# Patient Record
Sex: Male | Born: 1940 | Race: Black or African American | Hispanic: No | Marital: Married | Smoking: Former smoker
Health system: Southern US, Community
[De-identification: ages and names within clinical notes are randomized; demographics above are authoritative.]

## PROBLEM LIST (undated history)

## (undated) DIAGNOSIS — R7302 Impaired glucose tolerance (oral): Secondary | ICD-10-CM

## (undated) DIAGNOSIS — I10 Essential (primary) hypertension: Secondary | ICD-10-CM

## (undated) DIAGNOSIS — N529 Male erectile dysfunction, unspecified: Secondary | ICD-10-CM

## (undated) DIAGNOSIS — D369 Benign neoplasm, unspecified site: Secondary | ICD-10-CM

## (undated) DIAGNOSIS — H269 Unspecified cataract: Secondary | ICD-10-CM

## (undated) DIAGNOSIS — C801 Malignant (primary) neoplasm, unspecified: Secondary | ICD-10-CM

## (undated) DIAGNOSIS — T7840XA Allergy, unspecified, initial encounter: Secondary | ICD-10-CM

## (undated) DIAGNOSIS — D649 Anemia, unspecified: Secondary | ICD-10-CM

## (undated) HISTORY — DX: Male erectile dysfunction, unspecified: N52.9

## (undated) HISTORY — DX: Impaired glucose tolerance (oral): R73.02

## (undated) HISTORY — DX: Anemia, unspecified: D64.9

## (undated) HISTORY — DX: Allergy, unspecified, initial encounter: T78.40XA

## (undated) HISTORY — PX: COLONOSCOPY: SHX174

## (undated) HISTORY — DX: Benign neoplasm, unspecified site: D36.9

## (undated) HISTORY — DX: Essential (primary) hypertension: I10

## (undated) HISTORY — DX: Unspecified cataract: H26.9

---

## 2003-08-31 ENCOUNTER — Encounter: Payer: Self-pay | Admitting: Internal Medicine

## 2004-01-22 ENCOUNTER — Encounter: Payer: Self-pay | Admitting: Internal Medicine

## 2004-09-19 ENCOUNTER — Ambulatory Visit: Payer: Self-pay | Admitting: Internal Medicine

## 2005-01-16 ENCOUNTER — Ambulatory Visit: Payer: Self-pay | Admitting: Internal Medicine

## 2005-04-02 ENCOUNTER — Ambulatory Visit: Payer: Self-pay | Admitting: Internal Medicine

## 2005-05-30 ENCOUNTER — Ambulatory Visit: Payer: Self-pay | Admitting: Internal Medicine

## 2006-02-13 ENCOUNTER — Ambulatory Visit: Payer: Self-pay | Admitting: Internal Medicine

## 2006-04-07 ENCOUNTER — Ambulatory Visit: Payer: Self-pay | Admitting: Internal Medicine

## 2007-03-24 ENCOUNTER — Encounter: Payer: Self-pay | Admitting: Internal Medicine

## 2007-03-24 DIAGNOSIS — F528 Other sexual dysfunction not due to a substance or known physiological condition: Secondary | ICD-10-CM

## 2007-03-24 DIAGNOSIS — J301 Allergic rhinitis due to pollen: Secondary | ICD-10-CM

## 2007-08-18 ENCOUNTER — Ambulatory Visit: Payer: Self-pay | Admitting: Internal Medicine

## 2007-08-18 DIAGNOSIS — R7301 Impaired fasting glucose: Secondary | ICD-10-CM | POA: Insufficient documentation

## 2007-08-18 LAB — CONVERTED CEMR LAB
CO2: 32 meq/L (ref 19–32)
Calcium: 9.5 mg/dL (ref 8.4–10.5)
Cholesterol: 173 mg/dL (ref 0–200)
GFR calc Af Amer: 97 mL/min
Glucose, Bld: 106 mg/dL — ABNORMAL HIGH (ref 70–99)
HDL: 46.4 mg/dL (ref >39.0)
LDL Cholesterol: 106 mg/dL — ABNORMAL HIGH (ref 0–99)
Phosphorus: 3.5 mg/dL (ref 2.3–4.6)
Potassium: 4 meq/L (ref 3.5–5.1)
Sodium: 139 meq/L (ref 135–145)
Total CHOL/HDL Ratio: 3.7
Triglycerides: 104 mg/dL (ref 0–149)
VLDL: 21 mg/dL (ref 0–40)

## 2007-08-19 LAB — CONVERTED CEMR LAB
BUN: 8 mg/dL (ref 6–23)
CO2: 32 meq/L (ref 19–32)
Calcium: 9.5 mg/dL (ref 8.4–10.5)
Cholesterol: 173 mg/dL (ref 0–200)
GFR calc Af Amer: 97 mL/min
Hgb A1c MFr Bld: 6.1 % — ABNORMAL HIGH (ref 4.6–6.0)
LDL Cholesterol: 106 mg/dL — ABNORMAL HIGH (ref 0–99)
Phosphorus: 3.5 mg/dL (ref 2.3–4.6)
Potassium: 4 meq/L (ref 3.5–5.1)
Total CHOL/HDL Ratio: 3.7
VLDL: 21 mg/dL (ref 0–40)

## 2007-11-11 ENCOUNTER — Ambulatory Visit: Payer: Self-pay | Admitting: Internal Medicine

## 2008-06-27 ENCOUNTER — Ambulatory Visit: Payer: Self-pay | Admitting: Family Medicine

## 2008-06-27 DIAGNOSIS — I10 Essential (primary) hypertension: Secondary | ICD-10-CM

## 2008-08-01 ENCOUNTER — Ambulatory Visit: Payer: Self-pay | Admitting: Internal Medicine

## 2008-08-01 LAB — CONVERTED CEMR LAB
Albumin: 3.3 g/dL — ABNORMAL LOW (ref 3.5–5.2)
BUN: 13 mg/dL (ref 6–23)
CO2: 34 meq/L — ABNORMAL HIGH (ref 19–32)
Chloride: 99 meq/L (ref 96–112)
Creatinine, Ser: 1.1 mg/dL (ref 0.4–1.5)

## 2008-08-25 ENCOUNTER — Ambulatory Visit: Payer: Self-pay | Admitting: Internal Medicine

## 2008-09-08 ENCOUNTER — Ambulatory Visit: Payer: Self-pay | Admitting: Internal Medicine

## 2008-09-08 ENCOUNTER — Encounter: Payer: Self-pay | Admitting: Internal Medicine

## 2008-09-08 DIAGNOSIS — D126 Benign neoplasm of colon, unspecified: Secondary | ICD-10-CM

## 2008-09-11 ENCOUNTER — Ambulatory Visit: Payer: Self-pay | Admitting: Internal Medicine

## 2008-09-14 ENCOUNTER — Encounter (INDEPENDENT_AMBULATORY_CARE_PROVIDER_SITE_OTHER): Payer: Self-pay | Admitting: *Deleted

## 2008-10-20 ENCOUNTER — Ambulatory Visit: Payer: Self-pay | Admitting: Internal Medicine

## 2008-11-08 ENCOUNTER — Telehealth: Payer: Self-pay | Admitting: Internal Medicine

## 2008-11-17 ENCOUNTER — Ambulatory Visit (HOSPITAL_COMMUNITY): Admission: RE | Admit: 2008-11-17 | Discharge: 2008-11-17 | Payer: Self-pay | Admitting: Internal Medicine

## 2008-11-17 ENCOUNTER — Encounter: Payer: Self-pay | Admitting: Internal Medicine

## 2008-11-17 ENCOUNTER — Ambulatory Visit: Payer: Self-pay | Admitting: Internal Medicine

## 2009-01-24 ENCOUNTER — Ambulatory Visit: Payer: Self-pay | Admitting: Internal Medicine

## 2009-01-26 LAB — CONVERTED CEMR LAB
Albumin: 3.5 g/dL (ref 3.5–5.2)
Alkaline Phosphatase: 85 units/L (ref 39–117)
Basophils Relative: 0.6 % (ref 0.0–3.0)
CO2: 32 meq/L (ref 19–32)
Calcium: 9.4 mg/dL (ref 8.4–10.5)
Eosinophils Absolute: 0.1 10*3/uL (ref 0.0–0.7)
HCT: 39.5 % (ref 39.0–52.0)
Hemoglobin: 13.4 g/dL (ref 13.0–17.0)
Lymphs Abs: 2 10*3/uL (ref 0.7–4.0)
MCHC: 33.8 g/dL (ref 30.0–36.0)
MCV: 85.6 fL (ref 78.0–100.0)
Monocytes Absolute: 0.3 10*3/uL (ref 0.1–1.0)
Neutro Abs: 1.8 10*3/uL (ref 1.4–7.7)
Potassium: 4.3 meq/L (ref 3.5–5.1)
RBC: 4.61 M/uL (ref 4.22–5.81)
Sodium: 138 meq/L (ref 135–145)
TSH: 0.27 microintl units/mL — ABNORMAL LOW (ref 0.35–5.50)
Total Protein: 7.3 g/dL (ref 6.0–8.3)

## 2009-02-02 ENCOUNTER — Encounter (INDEPENDENT_AMBULATORY_CARE_PROVIDER_SITE_OTHER): Payer: Self-pay | Admitting: *Deleted

## 2009-02-07 ENCOUNTER — Telehealth: Payer: Self-pay | Admitting: Internal Medicine

## 2009-02-15 ENCOUNTER — Ambulatory Visit: Payer: Self-pay | Admitting: Internal Medicine

## 2009-02-16 LAB — CONVERTED CEMR LAB
Free T4: 1.1 ng/dL (ref 0.6–1.6)
TSH: 0.27 microintl units/mL — ABNORMAL LOW (ref 0.35–5.50)

## 2009-03-19 ENCOUNTER — Ambulatory Visit: Payer: Self-pay | Admitting: Internal Medicine

## 2009-03-20 ENCOUNTER — Telehealth: Payer: Self-pay | Admitting: Internal Medicine

## 2009-03-26 ENCOUNTER — Ambulatory Visit: Payer: Self-pay | Admitting: Internal Medicine

## 2009-03-26 ENCOUNTER — Ambulatory Visit (HOSPITAL_COMMUNITY): Admission: RE | Admit: 2009-03-26 | Discharge: 2009-03-26 | Payer: Self-pay | Admitting: Internal Medicine

## 2009-03-26 ENCOUNTER — Encounter: Payer: Self-pay | Admitting: Internal Medicine

## 2009-04-02 ENCOUNTER — Encounter (INDEPENDENT_AMBULATORY_CARE_PROVIDER_SITE_OTHER): Payer: Self-pay

## 2009-04-02 ENCOUNTER — Encounter: Payer: Self-pay | Admitting: Internal Medicine

## 2009-05-24 ENCOUNTER — Ambulatory Visit: Payer: Self-pay | Admitting: Internal Medicine

## 2009-08-20 ENCOUNTER — Encounter: Payer: Self-pay | Admitting: Internal Medicine

## 2009-08-21 ENCOUNTER — Ambulatory Visit: Payer: Self-pay | Admitting: Internal Medicine

## 2009-08-23 LAB — CONVERTED CEMR LAB
ALT: 19 units/L (ref 0–53)
Bilirubin, Direct: 0.1 mg/dL (ref 0.0–0.3)
Calcium: 9.9 mg/dL (ref 8.4–10.5)
Creatinine, Ser: 1.2 mg/dL (ref 0.4–1.5)
Eosinophils Absolute: 0.1 10*3/uL (ref 0.0–0.7)
Eosinophils Relative: 1.1 % (ref 0.0–5.0)
Glucose, Bld: 100 mg/dL — ABNORMAL HIGH (ref 70–99)
MCV: 89 fL (ref 78.0–100.0)
Monocytes Absolute: 0.4 10*3/uL (ref 0.1–1.0)
Neutrophils Relative %: 49.3 % (ref 43.0–77.0)
Platelets: 251 10*3/uL (ref 150.0–400.0)
Sodium: 140 meq/L (ref 135–145)
Total Bilirubin: 0.6 mg/dL (ref 0.3–1.2)
WBC: 4.9 10*3/uL (ref 4.5–10.5)

## 2009-10-30 ENCOUNTER — Ambulatory Visit: Payer: Self-pay | Admitting: Internal Medicine

## 2009-12-28 ENCOUNTER — Ambulatory Visit: Payer: Self-pay | Admitting: Internal Medicine

## 2010-03-04 ENCOUNTER — Encounter (INDEPENDENT_AMBULATORY_CARE_PROVIDER_SITE_OTHER): Payer: Self-pay | Admitting: *Deleted

## 2010-03-07 ENCOUNTER — Encounter (INDEPENDENT_AMBULATORY_CARE_PROVIDER_SITE_OTHER): Payer: Self-pay | Admitting: *Deleted

## 2010-04-10 ENCOUNTER — Encounter (INDEPENDENT_AMBULATORY_CARE_PROVIDER_SITE_OTHER): Payer: Self-pay | Admitting: *Deleted

## 2010-04-11 ENCOUNTER — Encounter (INDEPENDENT_AMBULATORY_CARE_PROVIDER_SITE_OTHER): Payer: Self-pay | Admitting: *Deleted

## 2010-04-12 ENCOUNTER — Ambulatory Visit: Payer: Self-pay | Admitting: Internal Medicine

## 2010-04-15 ENCOUNTER — Ambulatory Visit: Payer: Self-pay | Admitting: Internal Medicine

## 2010-04-24 ENCOUNTER — Ambulatory Visit: Payer: Self-pay | Admitting: Internal Medicine

## 2010-04-30 ENCOUNTER — Encounter: Payer: Self-pay | Admitting: Internal Medicine

## 2010-05-24 ENCOUNTER — Ambulatory Visit: Payer: Self-pay | Admitting: Internal Medicine

## 2010-05-27 LAB — CONVERTED CEMR LAB
ALT: 17 units/L (ref 0–53)
AST: 19 units/L (ref 0–37)
Basophils Relative: 0.7 % (ref 0.0–3.0)
Bilirubin, Direct: 0 mg/dL (ref 0.0–0.3)
CO2: 32 meq/L (ref 19–32)
Calcium: 9.3 mg/dL (ref 8.4–10.5)
Chloride: 104 meq/L (ref 96–112)
Eosinophils Relative: 1.3 % (ref 0.0–5.0)
HCT: 37 % — ABNORMAL LOW (ref 39.0–52.0)
Hgb A1c MFr Bld: 6.2 % (ref 4.6–6.5)
Lymphs Abs: 1.8 10*3/uL (ref 0.7–4.0)
MCV: 85 fL (ref 78.0–100.0)
Monocytes Absolute: 0.3 10*3/uL (ref 0.1–1.0)
Potassium: 4.2 meq/L (ref 3.5–5.1)
RBC: 4.36 M/uL (ref 4.22–5.81)
Sodium: 140 meq/L (ref 135–145)
Total Protein: 6.9 g/dL (ref 6.0–8.3)
WBC: 5.7 10*3/uL (ref 4.5–10.5)

## 2010-09-03 NOTE — Assessment & Plan Note (Signed)
Summary: BLEEDING WITH BOWEL MOVEMENTS   Vital Signs:  Patient profile:   70 year old male Height:      72 inches Weight:      226 pounds BMI:     30.76 Temp:     98.1 degrees F oral Pulse rate:   84 / minute Pulse rhythm:   regular BP sitting:   110 / 60  (left arm) Cuff size:   large  Vitals Entered By: Delilah Shan CMA Duncan Dull) (Dec 28, 2009 11:04 AM) CC: Bleeding with BM's   History of Present Illness: Noted some constipation about 3 weeks ago seemed to act up his hemorrhoids noticing blood with stools did try sitz baths Stool looked normal and blood just on paper and in bowl  trying to eat right and stay active  bowels are better now not taking any meds for this   Allergies: No Known Drug Allergies  Past History:  Past medical, surgical, family and social histories (including risk factors) reviewed for relevance to current acute and chronic problems.  Past Medical History: Reviewed history from 10/19/2008 and no changes required. Allergic rhinitis Glucose intolerance Erectile dysfunction Hypertension Adenomatous Colon Polyps Hemorrhoids  Past Surgical History: Reviewed history from 03/24/2007 and no changes required. Cardiolite basically negative 08/05  Family History: Reviewed history from 03/24/2007 and no changes required. Mom with ESRD and HTN Dad with CVA, HTN Pat GF died of leukemia Mat GF died of ?cancer, had DM No CAD  Social History: Reviewed history from 08/18/2007 and no changes required. 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   Rectal:  no hemorrhoids and no masses.   Prostate:  no gland enlargement and no nodules.     Impression & Recommendations:  Problem # 1:  RECTAL BLEEDING  (ICD-569.3) Assessment New probably small fissure from hard stools  better now will give Rx to use as needed  miralax if constipated  Complete Medication List: 1)  Amlodipine Besylate 5 Mg Tabs (Amlodipine besylate) .Marland Kitchen.. 1 tab daily for high blood pressure 2)  Anusol-hc 2.5 % Crea (Hydrocortisone) .... Apply three times a day to rectum as needed for pain or bleeding  Patient Instructions: 1)  Please use miralax 2-3 times daily if you get constipated again 2)  Please keep appt for physical Prescriptions: ANUSOL-HC 2.5 % CREA (HYDROCORTISONE) apply three times a day to rectum as needed for pain or bleeding  #1 tube x 2   Entered and Authorized by:   Cindee Salt MD   Signed by:   Cindee Salt MD on 12/28/2009   Method used:   Electronically to        [facility] [address] [phone]* (retail)       [address] [place], [place]  [address]       Ph: [phone]       Fax: [phone]   RxID:   [id number]    Orders Added: 1)  Est. Patient Level III [[address]]   Current Allergies (reviewed today): No known allergies

## 2010-09-03 NOTE — Miscellaneous (Signed)
Summary: [facility] Previsit/prep  Clinical Lists Changes  Medications: Added new medication of MOVIPREP 100 GM  SOLR (PEG-KCL-NACL-NASULF-NA ASC-C) As per prep instructions. - Signed Rx of MOVIPREP 100 GM  SOLR (PEG-KCL-NACL-NASULF-NA ASC-C) As per prep instructions.;  #1 x 0;  Signed;  Entered by: Wyona Almas RN;  Authorized by: Iva Boop MD, [facility];  Method used: Electronically to [facility] [address] [place] #[id number]*, [address], [place], [place]  [address], Ph: [phone], Fax: [phone] Observations: Added new observation of NKA: T (04/15/2010 8:59)    Prescriptions: MOVIPREP 100 GM  SOLR (PEG-KCL-NACL-NASULF-NA ASC-C) As per prep instructions.  #1 x 0   Entered by:   Wyona Almas RN   Authorized by:   Iva Boop MD, [facility]   Signed by:   Wyona Almas RN on 04/15/2010   Method used:   Electronically to        [facility] [address] [phone]* (retail)       [address], [place]  [address]       Ph: [phone]       Fax: [phone]   RxID:   [id number]

## 2010-09-03 NOTE — Letter (Signed)
Summary: Previsit letter  [facility]  [address] [place], [place] [address]   Phone: [phone]  Fax: [phone]       03/07/2010 MRN: [id number]  [facility] [address], [place]  [address]  Dear Mr. Holsonback,  Welcome to the Gastroenterology Division at [facility].    You are scheduled to see a nurse for your pre-procedure visit on 04-10-10 at 8:00A.M. on the 3rd floor at [facility], [address]  We ask that you try to arrive at our office 15 minutes prior to your appointment time to allow for check-in.  Your nurse visit will consist of discussing your medical and surgical history, your immediate family medical history, and your medications.    Please bring a complete list of all your medications or, if you prefer, bring the medication bottles and we will list them.  We will need to be aware of both prescribed and over the counter drugs.  We will need to know exact dosage information as well.  If you are on blood thinners (Coumadin, Plavix, Aggrenox, Ticlid, etc.) please call our office today/prior to your appointment, as we need to consult with your physician about holding your medication.   Please be prepared to read and sign documents such as consent forms, a financial agreement, and acknowledgement forms.  If necessary, and with your consent, a friend or relative is welcome to sit-in on the nurse visit with you.  Please bring your insurance card so that we may make a copy of it.  If your insurance requires a referral to see a specialist, please bring your referral form from your primary care physician.  No co-pay is required for this nurse visit.     If you cannot keep your appointment, please call [phone] to cancel or reschedule prior to your appointment date.  This allows [place] the opportunity to schedule an appointment for another patient in need of care.    Thank you for choosing [facility] for your medical  needs.  We appreciate the opportunity to care for you.  Please visit [place] at our website  to learn more about our practice.                     Sincerely.                                                                                                                   The Gastroenterology Division

## 2010-09-03 NOTE — Assessment & Plan Note (Signed)
Summary: CPX/RBH  R/S FROM 05/03/10   Vital Signs:  Patient profile:   70 year old male Height:      72 inches Weight:      228 pounds Temp:     98.1 degrees F oral Pulse rate:   76 / minute Pulse rhythm:   regular BP sitting:   120 / 70  (left arm) Cuff size:   large  Vitals Entered By: Mervin Hack CMA Duncan Dull) (May 24, 2010 8:06 AM) CC: adult physical   History of Present Illness: DOing better with bowels Not constipated with the miralax  No trouble with the BP med  He has no new concerns Occ soreness in back No limitations though--tries to stay active. 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  Past Medical History: Reviewed history from 10/19/2008 and no changes required. Allergic rhinitis Glucose intolerance Erectile dysfunction Hypertension Adenomatous Colon Polyps Hemorrhoids  Past Surgical History: Reviewed history from 03/24/2007 and no changes required. Cardiolite basically negative 08/05  Family History: Reviewed history from 03/24/2007 and no changes required. 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 Eyes:  Denies double vision and vision loss-1 eye. ENT:  Complains of ringing in ears; denies decreased hearing; full dentures. CV:  Denies chest pain or discomfort, difficulty breathing at night, difficulty breathing while lying down, fainting, lightheadness, palpitations, and shortness of  breath with exertion. Resp:  Denies cough and shortness of breath. GI:  Denies abdominal pain, bloody stools, change in bowel habits, dark tarry stools, indigestion, nausea, and vomiting. GU:  Complains of nocturia and urinary frequency; no sex--no problem. MS:  Complains of low back pain; denies joint pain and joint swelling. Derm:  Denies lesion(s) and rash. Neuro:  Complains of weakness; denies numbness and tingling; occ trouble picking things up with right hand. Psych:  Denies anxiety and depression. Endo:  Denies cold intolerance, excessive urination, and heat intolerance. Heme:  Denies abnormal bruising and enlarge lymph nodes. Allergy:  Complains of seasonal allergies and sneezing; mild allergies--claritin helps.  Physical Exam  General:  alert and normal appearance.   Eyes:  pupils equal, pupils round, and pupils reactive to light.  Disks sharp---some dark spots on left disk (due for his eye exam) Ears:  R ear normal and L ear normal.   Mouth:  no erythema, no exudates, and no lesions.   Neck:  supple, no masses, no thyromegaly, no carotid bruits, and no cervical lymphadenopathy.   Lungs:  normal respiratory effort, no intercostal retractions, no accessory muscle use, and normal breath sounds.   Heart:  normal rate, regular rhythm, no murmur, and no gallop.   Abdomen:  soft, non-tender, and no masses.   Msk:  no joint tenderness and no joint swelling.   fairly normal active ROM in shoulders Pulses:  normal in feet Extremities:  no edema Neurologic:  alert & oriented X3, strength normal in all extremities, and gait normal.   Skin:  no rashes and no suspicious lesions.   Axillary Nodes:  No palpable lymphadenopathy Psych:  normally interactive, good eye contact, not anxious appearing, and not depressed appearing.     Impression & Recommendations:  Problem # 1:  PREVENTIVE HEALTH CARE (ICD-V70.0) Assessment Comment Only healthy discussed fitness just had colon PSA less  than 1 year ago--will reconsider in 1 year Imms UTD  Problem # 2:  HYPERTENSION (ICD-401.9) Assessment: Comment Only  good control now no changes  His updated medication list for this problem includes:    Amlodipine Besylate 5 Mg Tabs (Amlodipine besylate) .Marland Kitchen... 1 tab daily for high blood pressure  BP today: 120/70 Prior BP: 140/80 (04/12/2010)  Labs Reviewed: K+: 4.4 (08/21/2009) Creat: : 1.2 (08/21/2009)   Chol: 173 (08/18/2007)   HDL: 46.4 (08/18/2007)   LDL: 106 (08/18/2007)   TG: 104 (08/18/2007)  Orders: TLB-Renal Function Panel (80069-RENAL) TLB-CBC Platelet - w/Differential (85025-CBCD) TLB-Hepatic/Liver Function Pnl (80076-HEPATIC) TLB-TSH (Thyroid Stimulating Hormone) (84443-TSH) Venipuncture (11914)  Problem # 3:  IMPAIRED FASTING GLUCOSE (ICD-790.21) Assessment: Comment Only  discussed weight control and eating will check A1c  Orders: TLB-A1C / Hgb A1C (Glycohemoglobin) (83036-A1C)  Complete Medication List: 1)  Amlodipine Besylate 5 Mg Tabs (Amlodipine besylate) .Marland Kitchen.. 1 tab daily for high blood pressure  Patient Instructions: 1)  Please schedule a follow-up appointment in 6 months .    Orders Added: 1)  Est. Patient 65& > [99397] 2)  TLB-Renal Function Panel [80069-RENAL] 3)  TLB-CBC Platelet - w/Differential [85025-CBCD] 4)  TLB-Hepatic/Liver Function Pnl [80076-HEPATIC] 5)  TLB-TSH (Thyroid Stimulating Hormone) [84443-TSH] 6)  Venipuncture [36415] 7)  TLB-A1C / Hgb A1C (Glycohemoglobin) [83036-A1C]    Current Allergies (reviewed today): No known allergies

## 2010-09-03 NOTE — Assessment & Plan Note (Signed)
Summary: HEMORRHOIDS/CLE   Vital Signs:  Patient profile:   70 year old male Weight:      227 pounds Temp:     98.4 degrees F oral Pulse rate:   76 / minute Pulse rhythm:   regular BP sitting:   140 / 80  (left arm) Cuff size:   large  Vitals Entered By: Mervin Hack CMA Duncan Dull) (April 12, 2010 11:08 AM) CC: hemorrhoids   History of Present Illness: having trouble moving bowels tried the cream and the bleeding has stopped He notes straining to get things going--then he does empty well some pain for a couple of hours after Goes about 3 times a week Not particularly big---does clog the bowl  appetite is fine no abd pain Hasn't used any laxative but his daughter got him something to take  Allergies: No Known Drug Allergies  Past History:  Past medical, surgical, family and social histories (including risk factors) reviewed for relevance to current acute and chronic problems.  Past Medical History: Reviewed history from 10/19/2008 and no changes required. Allergic rhinitis Glucose intolerance Erectile dysfunction Hypertension Adenomatous Colon Polyps Hemorrhoids  Past Surgical History: Reviewed history from 03/24/2007 and no changes required. Cardiolite basically negative 08/05  Family History: Reviewed history from 03/24/2007 and no changes required. Mom with ESRD and HTN Dad with CVA, HTN Pat GF died of leukemia Mat GF died of ?cancer, had DM No CAD  Social History: Reviewed history from 08/18/2007 and no changes required. 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   Rectal:  no external abnormalities and no hemorrhoids.   Very tender with digital exam   Impression & Recommendations:  Problem # 1:  RECTAL PAIN  (WJX-914.78) Assessment New seems to have rectal irritation---probably from hard stool he will use the anusol inside start miralax to soften stools and get him more regular  Complete Medication List: 1)  Amlodipine Besylate 5 Mg Tabs (Amlodipine besylate) .Marland Kitchen.. 1 tab daily for high blood pressure 2)  Anusol-hc 2.5 % Crea (Hydrocortisone) .... Apply three times a day to rectum as needed for pain or bleeding 3)  Miralax Powd (Polyethylene glycol 3350) .Marland Kitchen.. 1 capful mixed with water daily to prevent constipation  Patient Instructions: 1)  Please keep appt for physical in October  Current Allergies (reviewed today): No known allergies   Appended Document: HEMORRHOIDS/CLE     Clinical Lists Changes  Orders: Added new Service order of Flu Vaccine 95yrs + [phone]) - Signed Added new Service order of Admin 1st Vaccine (13086) - Signed Added new Service order of Admin 1st Vaccine [facility]) [phone]) - Signed Observations: Added new observation of FLU VAX#1VIS: 02/26/10 version given April 12, 2010. (04/12/2010 11:50) Added new observation of FLU VAXLOT: [id number] (04/12/2010 11:50) Added new observation of FLU VAX EXP: 02/01/2011 (04/12/2010 11:50) Added new observation of FLU VAXBY: DeShannon Smith CMA (AAMA) (04/12/2010 11:50) Added new observation of FLU VAXRTE: IM (04/12/2010 11:50) Added new observation of FLU VAX DSE: 0.5 ml (04/12/2010 11:50) Added new observation of FLU VAXMFR: GlaxoSmithKline (04/12/2010 11:50) Added new observation of FLU VAX SITE: left deltoid (04/12/2010 11:50) Added new observation of FLU VAX: Fluvax 3+ (04/12/2010 11:50)       Influenza Vaccine    Vaccine Type:  Fluvax 3+    Site: left deltoid    Mfr: GlaxoSmithKline    Dose: 0.5 ml    Route: IM    Given by: Mervin Hack CMA (AAMA)    Exp. Date: 02/01/2011    Lot #: [id number]    VIS given: 02/26/10 version given April 12, 2010.  Flu Vaccine Consent Questions    Do you have a history  of severe allergic reactions to this vaccine? no    Any prior history of allergic reactions to egg and/or gelatin? no    Do you have a sensitivity to the preservative Thimersol? no    Do you have a past history of Guillan-Barre Syndrome? no    Do you currently have an acute febrile illness? no    Have you ever had a severe reaction to latex? no    Vaccine information given and explained to patient? yes

## 2010-09-03 NOTE — Letter (Signed)
Summary: Colonoscopy Letter   Gastroenterology  [address] [place], [place] [address]   Phone: [phone]  Fax: [phone]      March 04, 2010 MRN: [id number]   [facility] [address] [place], [place]  [address]   Dear [name],   According to your medical record, it is time for you to schedule a Colonoscopy. The American Cancer Society recommends this procedure as a method to detect early colon cancer. Patients with a family history of colon cancer, or a personal history of colon polyps or inflammatory bowel disease are at increased risk.  This letter has beeen generated based on the recommendations made at the time of your procedure. If you feel that in your particular situation this may no longer apply, please contact our office.  Please call our office at [phone] to schedule this appointment or to update your records at your earliest convenience.  Thank you for cooperating with [place] to provide you with the very best care possible.   Sincerely,   Iva Boop, M.D.  [facility] Gastroenterology Division [phone]

## 2010-09-03 NOTE — Procedures (Signed)
Summary: Colonoscopy  Patient: Matthew Carter Note: All result statuses are Final unless otherwise noted.  Tests: (1) Colonoscopy (COL)   COL Colonoscopy           DONE     [facility]     [address]     [place], [place]  [address]           COLONOSCOPY PROCEDURE REPORT           PATIENT:  Matthew Carter, Matthew Carter  MR#:  [id number]     BIRTHDATE:  1940-09-18, 69 yrs. old  GENDER:  male     ENDOSCOPIST:  Iva Boop, MD, [facility]     REF. BY:     PROCEDURE DATE:  04/24/2010     PROCEDURE:  Colonoscopy with snare polypectomy     ASA CLASS:  Class II     INDICATIONS:  surveillance and high-risk screening, history of     pre-cancerous (adenomatous) colon polyps 7 polyps removed 2010,     tubulovillous, adenoma and hyperplastic     largest was 3 cm sessile TV adenoma of sigmoid     for closer follow-up and screening due to size, # and type of     polyps     MEDICATIONS:   Fentanyl 50 mcg IV, Versed 6 mg IV           DESCRIPTION OF PROCEDURE:   After the risks benefits and     alternatives of the procedure were thoroughly explained, informed     consent was obtained.  Digital rectal exam was performed and     revealed no abnormalities and normal prostate.   The LB [id number]-[id number]     [id number] endoscope was introduced through the anus and advanced to     the cecum, which was identified by both the appendix and ileocecal     valve, without limitations.  The quality of the prep was     excellent, using MoviPrep.  The instrument was then slowly     withdrawn as the colon was fully examined.     Insertion: 4:19 minutes Withdrawal: 15:45 minutes     <<PROCEDUREIMAGES>>           FINDINGS:  A sessile polyp was found at the hepatic flexure. It     was 6 mm in size. Seen on right colon retroflexion. Polyp was     snared without cautery. Retrieval was unsuccessful.  A     pedunculated polyp was found. It was 1 cm in size. Polyp was     snared, then cauterized with monopolar cautery. Retrieval  was     successful. Mucosal abnormality in the sigmoid colon. Tattoo and     scar at prior sigmoid polypectmy site. No evidence of recurrent     polyp.  This was otherwise a normal examination of the colon.     Retroflexed views in the rectum revealed no abnormalities.    The     scope was then withdrawn from the patient and the procedure     completed.           COMPLICATIONS:  None     ENDOSCOPIC IMPRESSION:     1) 6 mm sessile polyp at the hepatic flexure - removed but not     recovered     2) 1 cm pedunculated polyp - removed and recovered     3) Mucosal abnormality in the sigmoid colon     4) Otherwise normal examination,  excellent prep     5) 7 adenomas removed in 2010, largest 3 cm TV adenoma.     RECOMMENDATIONS:     1) No aspirin or NSAID's for 1 week.     2) Continue MiraLax for constipation as recommended by Dr. Alphonsus Sias           REPEAT EXAM:  In for Colonoscopy, pending biopsy results.           Iva Boop, MD, [facility]           CC:  The Patient           n.     eSIGNED:   Iva Boop at 04/24/2010 11:58 AM           Matthew Carter, [id number]  Note: An exclamation mark (!) indicates a result that was not dispersed into the flowsheet. Document Creation Date: 04/24/2010 11:59 AM _______________________________________________________________________  (1) Order result status: Final Collection or observation date-time: 04/24/2010 11:46 Requested date-time:  Receipt date-time:  Reported date-time:  Referring Physician:   Ordering Physician: Stan Head [phone]) Specimen Source:  Source: Launa Grill Order Number: [id number] Lab site:   Appended Document: Colonoscopy     Procedures Next Due Date:    Colonoscopy: 05/2013

## 2010-09-03 NOTE — Letter (Signed)
Summary: Pre Visit No Show Letter  [facility]  [address] [place], [place] [address]   Phone: [phone]  Fax: [phone]        April 10, 2010 MRN: [id number]    [facility] [address] [place], [place]  [address]    Dear Mr. Arnett,   We have been unable to reach you by phone concerning the pre-procedure visit that you missed on 04/10/2010. For this reason,your procedure scheduled on Wednesday 04/24/2010 has been cancelled. Our scheduling staff will gladly assist you with rescheduling your appointments at a more convenient time. Please call our office at [phone] between the hours of 8:00am and 5:00pm, press option #2 to reach an appointment scheduler. Please consider updating your contact numbers at this time so that we can reach you by phone in the future with schedule changes or results.    Thank you,    Ezra Sites RN [facility]

## 2010-09-03 NOTE — Assessment & Plan Note (Signed)
Summary: 8:15 APPT 2 MONTH FOLLOW UP/[facility]   Vital Signs:  Patient profile:   70 year old male Weight:      228 pounds Temp:     98.3 degrees F oral Pulse rate:   64 / minute Pulse rhythm:   regular BP sitting:   132 / 78  (left arm) Cuff size:   large  Vitals Entered By: Mervin Hack CMA Duncan Dull) (October 30, 2009 8:15 AM) CC: 2 month follow-up   History of Present Illness: Doing well on new med  Checks BP at home Usually  ~132/70 or close No headaches No chest pain No SOB  No change in exercise tolerance Walking every other day---3/4 mile  Allergies: No Known Drug Allergies  Past History:  Past medical, surgical, family and social histories (including risk factors) reviewed for relevance to current acute and chronic problems.  Past Medical History: Reviewed history from 10/19/2008 and no changes required. Allergic rhinitis Glucose intolerance Erectile dysfunction Hypertension Adenomatous Colon Polyps Hemorrhoids  Past Surgical History: Reviewed history from 03/24/2007 and no changes required. Cardiolite basically negative 08/05  Family History: Reviewed history from 03/24/2007 and no changes required. Mom with ESRD and HTN Dad with CVA, HTN Pat GF died of leukemia Mat GF died of ?cancer, had DM No CAD  Social History: Reviewed history from 08/18/2007 and no changes required. 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   Neck:  supple, no masses, no thyromegaly, no carotid bruits, and no cervical lymphadenopathy.   Lungs:  normal respiratory effort and normal breath sounds.   Heart:  normal rate, regular rhythm, no murmur, and no gallop.   Extremities:   no edema Psych:  normally interactive, good eye contact, not anxious appearing, and not depressed appearing.     Impression & Recommendations:  Problem # 1:  HYPERTENSION (ICD-401.9) Assessment Improved doing well on new med no changes needed  His updated medication list for this problem includes:    Amlodipine Besylate 5 Mg Tabs (Amlodipine besylate) .Marland Kitchen... 1 tab daily for high blood pressure  BP today: 132/78 Prior BP: 152/84 (08/21/2009)  Labs Reviewed: K+: 4.4 (08/21/2009) Creat: : 1.2 (08/21/2009)   Chol: 173 (08/18/2007)   HDL: 46.4 (08/18/2007)   LDL: 106 (08/18/2007)   TG: 104 (08/18/2007)  Complete Medication List: 1)  Amlodipine Besylate 5 Mg Tabs (Amlodipine besylate) .Marland Kitchen.. 1 tab daily for high blood pressure  Patient Instructions: 1)  Please schedule a follow-up appointment in 6 months for physical  Current Allergies (reviewed today): No known allergies

## 2010-09-03 NOTE — Letter (Signed)
Summary: Patient Notice- Polyp Results  [facility]  [address] [place], [place] [address]   Phone: [phone]  Fax: [phone]        April 30, 2010 MRN: [id number]    [facility] [address] [place], [place]  [address]    Dear Mr. Obst,  The larger polyp removed from your colon was adenomatous. This means that it was pre-cancerous or that  it had the potential to change into cancer over time. The smaller polyp was removed but not recovered.  I recommend that you have a repeat colonoscopy in 3 years to determine if you have developed any new polyps over time. If you develop any new rectal bleeding, abdominal pain or significant bowel habit changes, please contact us before then.  Please call us if you are having persistent problems or have questions about your condition that have not been fully answered at this time.  Sincerely,  Iva Boop MD, [facility]  This letter has been electronically signed by your physician.  Appended Document: Patient Notice- Polyp Results letter mailed

## 2010-09-03 NOTE — Assessment & Plan Note (Signed)
Summary: 8:15 FOLLOW UP/RBH   Vital Signs:  Patient profile:   70 year old male Weight:      228.50 pounds BMI:     31.10 Temp:     98.1 degrees F oral Pulse rate:   68 / minute Pulse rhythm:   regular BP sitting:   152 / 84  (left arm) Cuff size:   regular  Vitals Entered By: Linde Gillis CMA Duncan Dull) (August 21, 2009 8:03 AM) CC: follow-up visit   History of Present Illness: Doing fairly well Feels that he gets funny feeling in chest after taking HCTZ--brief (5-10 minutes). May occur 45-60 minutes after taking No dizziness forgot it one day and no sensation  Stays active walks regularly No change in stamina No chest pain--except can get tight if he goes out in the cold No sig SOB    Allergies (verified): No Known Drug Allergies  Past History:  Past medical, surgical, family and social histories (including risk factors) reviewed for relevance to current acute and chronic problems.  Past Medical History: Reviewed history from 10/19/2008 and no changes required. Allergic rhinitis Glucose intolerance Erectile dysfunction Hypertension Adenomatous Colon Polyps Hemorrhoids  Past Surgical History: Reviewed history from 03/24/2007 and no changes required. Cardiolite basically negative 08/05  Family History: Reviewed history from 03/24/2007 and no changes required. Mom with ESRD and HTN Dad with CVA, HTN Pat GF died of leukemia Mat GF died of ?cancer, had DM No CAD  Social History: Reviewed history from 08/18/2007 and no changes required. 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   Neck:  supple, no masses, no thyromegaly, no carotid bruits, and no  cervical lymphadenopathy.   Lungs:  normal respiratory effort and normal breath sounds.   Heart:  normal rate, regular rhythm, no murmur, and no gallop.   Abdomen:  soft, non-tender, and no masses.   Pulses:  1+ in feet Extremities:  no edema Neurologic:  alert & oriented X3, strength normal in all extremities, and gait normal.   Skin:  no rashes and no suspicious lesions.   Psych:  normally interactive, good eye contact, not anxious appearing, and not depressed appearing.     Impression & Recommendations:  Problem # 1:  CHEST PAIN (ICD-786.50) Assessment New  actually a vague discomfort after taking HCTZ no symptoms to suggest ischemia will check labs and try changing BP med  Orders: EKG w/ Interpretation (93000) TLB-Renal Function Panel (80069-RENAL) TLB-CBC Platelet - w/Differential (85025-CBCD) TLB-Hepatic/Liver Function Pnl (80076-HEPATIC) TLB-TSH (Thyroid Stimulating Hormone) (84443-TSH) Venipuncture (96045)  Problem # 2:  HYPERTENSION (ICD-401.9) Assessment: Comment Only will change due to side effects with HCTZ  The following medications were removed from the medication list:    Hydrochlorothiazide 12.5 Mg Tabs (Hydrochlorothiazide) .Marland Kitchen... 1 by mouth daily His updated medication list for this problem includes:    Amlodipine Besylate 5 Mg Tabs (Amlodipine besylate) .Marland Kitchen... 1 tab daily for high blood pressure  BP today: 152/84 Prior BP: 128/70 (02/15/2009)  Labs Reviewed: K+: 4.3 (01/24/2009) Creat: : 1.1 (01/24/2009)   Chol: 173 (08/18/2007)   HDL: 46.4 (08/18/2007)   LDL: 106 (08/18/2007)   TG: 104 (08/18/2007)  Complete Medication List: 1)  Amlodipine Besylate 5  Mg Tabs (Amlodipine besylate) .Marland Kitchen.. 1 tab daily for high blood pressure  Other Orders: Pneumococcal Vaccine ([address]) Admin 1st Vaccine ([address]454) TLB-PSA (Prostate Specific Antigen) (84153-PSA)  Patient Instructions: 1)  Please schedule a follow-up appointment in 2 months.  Prescriptions: AMLODIPINE  BESYLATE 5 MG TABS (AMLODIPINE BESYLATE) 1 tab daily for high blood pressure  #30 x 12   Entered and Authorized by:   Cindee Salt MD   Signed by:   Cindee Salt MD on 08/21/2009   Method used:   Electronically to        [facility] [address] [phone]* (retail)       [address] [place], [place]  [address]       Ph: [phone]       Fax: [phone]   RxID:   [id number]   Current Allergies (reviewed today): No known allergies    Immunizations Administered:  Pneumonia Vaccine:    Vaccine Type: Pneumovax    Site: left deltoid    Mfr: Merck    Dose: 0.5 ml    Route: IM    Given by: Linde Gillis CMA (AAMA)    Exp. Date: 07/14/2010    Lot #: [id number]    VIS given: 03/01/96 version given August 21, 2009.    EKG  Procedure date:  08/21/2009  Findings:      sinus @74  LBBB

## 2010-09-03 NOTE — Letter (Signed)
Summary: [facility] Instructions  [facility]  [address] [place], [place] [address]   Phone: [phone]  Fax: [phone]       Matthew Carter    [birth year]    MRN: [id number]        Procedure Day Dorna Bloom:  Wednesday   04/24/10     Arrival Time:  9:30 am      Procedure Time:  10:30 am     Location of Procedure:                    _x _  [facility] (4th Floor)                        PREPARATION FOR COLONOSCOPY WITH MOVIPREP   Starting 5 days prior to your procedure Friday 04/19/10 do not eat nuts, seeds, popcorn, corn, beans, peas,  salads, or any raw vegetables.  Do not take any fiber supplements (e.g. Metamucil, Citrucel, and Benefiber).  THE DAY BEFORE YOUR PROCEDURE         DATE: Tuesday   04/23/10 1.  Drink clear liquids the entire day-NO SOLID FOOD  2.  Do not drink anything colored red or purple.  Avoid juices with pulp.  No orange juice.  3.  Drink at least 64 oz. (8 glasses) of fluid/clear liquids during the day to prevent dehydration and help the prep work efficiently.  CLEAR LIQUIDS INCLUDE: Water Jello Ice Popsicles Tea (sugar ok, no milk/cream) Powdered fruit flavored drinks Coffee (sugar ok, no milk/cream) Gatorade Juice: apple, white grape, white cranberry  Lemonade Clear bullion, consomm, broth Carbonated beverages (any kind) Strained chicken noodle soup Hard Candy                             4.  In the morning, mix first dose of MoviPrep solution:    Empty 1 Pouch A and 1 Pouch B into the disposable container    Add lukewarm drinking water to the top line of the container. Mix to dissolve    Refrigerate (mixed solution should be used within 24 hrs)  5.  Begin drinking the prep at 5:00 p.m. The MoviPrep container is divided by 4 marks.   Every 15 minutes drink the solution down to the next mark (approximately 8 oz) until the full liter is complete.   6.  Follow completed prep with 16 oz of clear liquid of your choice  (Nothing red or purple).  Continue to drink clear liquids until bedtime.  7.  Before going to bed, mix second dose of MoviPrep solution:    Empty 1 Pouch A and 1 Pouch B into the disposable container    Add lukewarm drinking water to the top line of the container. Mix to dissolve    Refrigerate  THE DAY OF YOUR PROCEDURE      DATE: Wednesday   04/24/10  Beginning at 5:30 a.m. (5 hours before procedure):         1. Every 15 minutes, drink the solution down to the next mark (approx 8 oz) until the full liter is complete.  2. Follow completed prep with 16 oz. of clear liquid of your choice.    3. You may drink clear liquids until 8:30 am (2 HOURS BEFORE PROCEDURE).   MEDICATION INSTRUCTIONS  Unless otherwise instructed, you should take regular prescription medications with a small sip of water  as early as possible the morning of your procedure.        OTHER INSTRUCTIONS  You will need a responsible adult at least 68 years of age to accompany you and drive you home.   This person must remain in the waiting room during your procedure.  Wear loose fitting clothing that is easily removed.  Leave jewelry and other valuables at home.  However, you may wish to bring a book to read or  an iPod/MP3 player to listen to music as you wait for your procedure to start.  Remove all body piercing jewelry and leave at home.  Total time from sign-in until discharge is approximately 2-3 hours.  You should go home directly after your procedure and rest.  You can resume normal activities the  day after your procedure.  The day of your procedure you should not:   Drive   Make legal decisions   Operate machinery   Drink alcohol   Return to work  You will receive specific instructions about eating, activities and medications before you leave.    The above instructions have been reviewed and explained to me by   Wyona Almas RN  April 15, 2010 9:34 AM     I fully  understand and can verbalize these instructions _____________________________ Date _________

## 2010-10-08 ENCOUNTER — Encounter: Payer: Self-pay | Admitting: Internal Medicine

## 2010-10-08 LAB — HM SIGMOIDOSCOPY

## 2010-10-08 LAB — FECAL OCCULT BLOOD, GUAIAC: Fecal Occult Blood: NEGATIVE

## 2010-11-27 ENCOUNTER — Encounter: Payer: Self-pay | Admitting: Internal Medicine

## 2010-11-27 ENCOUNTER — Ambulatory Visit (INDEPENDENT_AMBULATORY_CARE_PROVIDER_SITE_OTHER): Payer: Medicare Other | Admitting: Internal Medicine

## 2010-11-27 VITALS — BP 130/70 | HR 74 | Temp 98.0°F | Ht 72.0 in | Wt 228.0 lb

## 2010-11-27 DIAGNOSIS — R109 Unspecified abdominal pain: Secondary | ICD-10-CM | POA: Insufficient documentation

## 2010-11-27 DIAGNOSIS — K6289 Other specified diseases of anus and rectum: Secondary | ICD-10-CM

## 2010-11-27 DIAGNOSIS — I10 Essential (primary) hypertension: Secondary | ICD-10-CM

## 2010-11-27 DIAGNOSIS — J309 Allergic rhinitis, unspecified: Secondary | ICD-10-CM

## 2010-11-27 NOTE — Patient Instructions (Signed)
  Sitz Bath A sitz bath is a warm water bath taken in the sitting position that covers only the hips and buttocks. It may be used for either healing or hygiene purposes. The water may contain medication. Sitz baths are often used to relieve pain, itching or muscle spasms. Moist heat will help you heal and relax. Follow these steps carefully: HOME CARE INSTRUCTIONS  Fill the bathtub half full with warm water. Not Too Hot!   Sit in the water and open the drain a little.   Turn on the warm water to keep the tub half full. Keep the water running constantly.   Soak in the water for 15 to 20 minutes.   After the sitz bath, blot dry the affected part first.   Take 3 to 4 sitz baths a day.  SEEK MEDICAL CARE IF: You get worse instead of better; stop the sitz baths. Document Released: 04/12/2004 Document Re-Released: 08/12/2009 [facility] Patient Information 2011 [place], [place].

## 2010-11-27 NOTE — Progress Notes (Signed)
  Subjective:    Patient ID: SKIP LITKE, male    DOB: [birth year], 70 y.o.   MRN: [id number]  HPI DOing okay in general  Still has problem when going to the bathroom--gets pain after BM No blood Has been using the cream I prescribed occ Seemed to help at first Uses MOM at times--softens bowels  No sig allergy problems Usually pollen sensitive but not bad this year Not needing meds  Occ checks BP at home Generally about 130-140/70-80 No headaches No chest pain or SOB No edema  Current outpatient prescriptions:amLODipine (NORVASC) 5 MG tablet, Take 5 mg by mouth daily.  , Disp: , Rfl:   Past Medical History  Diagnosis Date  . Allergy   . Glucose intolerance (impaired glucose tolerance)   . ED (erectile dysfunction)   . Hypertension   . Adenomatous polyps   . Hemorrhoids     No past surgical history on file.  Family History  Problem Relation Age of Onset  . GER disease Mother   . Hypertension Mother   . Stroke Father   . Hypertension Father   . Cancer Paternal Grandfather     Leukemia  . Diabetes Paternal Grandfather     History   Social History  . Marital Status: Married    Spouse Name: N/A    Number of Children: 3  . Years of Education: N/A   Occupational History  . Retired Librarian, academic   Social History Main Topics  . Smoking status: Former Smoker    Quit date: 08/04/1981  . Smokeless tobacco: Not on file  . Alcohol Use: No  . Drug Use:   . Sexually Active:    Other Topics Concern  . Not on file   Social History Narrative  . No narrative on file   Review of Systems Appetite is good Weight is stable Sleeps well     Objective:   Physical Exam  Constitutional: He appears well-developed and well-nourished. No distress.  Neck: Normal range of motion. Neck supple. No thyromegaly present.  Cardiovascular: Normal rate, regular rhythm and normal heart sounds.  Exam reveals no gallop.   No murmur  heard. Pulmonary/Chest: Effort normal and breath sounds normal. No respiratory distress. He has no wheezes. He has no rales.  Abdominal: Soft. He exhibits no mass. There is no tenderness.  Musculoskeletal: Normal range of motion. He exhibits no edema and no tenderness.  Lymphadenopathy:    He has no cervical adenopathy.  Psychiatric: He has a normal mood and affect. His behavior is normal. Judgment and thought content normal.          Assessment & Plan:

## 2011-01-19 ENCOUNTER — Other Ambulatory Visit: Payer: Self-pay | Admitting: Internal Medicine

## 2011-05-01 ENCOUNTER — Ambulatory Visit (INDEPENDENT_AMBULATORY_CARE_PROVIDER_SITE_OTHER): Payer: Medicare Other

## 2011-05-01 DIAGNOSIS — Z23 Encounter for immunization: Secondary | ICD-10-CM

## 2011-07-08 ENCOUNTER — Telehealth: Payer: Self-pay | Admitting: *Deleted

## 2011-07-08 NOTE — Telephone Encounter (Signed)
Patient called stating that he was told to take an Asprin 325 mg daily. Patient states that the pharmacist gave him the generic and not the brand and he wants to know if this is okay? Please advise.

## 2011-07-08 NOTE — Telephone Encounter (Signed)
Spoke with patient and advised results   

## 2011-07-08 NOTE — Telephone Encounter (Signed)
There is no "brand" of aspirin I dont' recommend specific manufacturers like Bayer As long as it doesn't bother his stomach, generic is fine

## 2011-11-14 ENCOUNTER — Ambulatory Visit (INDEPENDENT_AMBULATORY_CARE_PROVIDER_SITE_OTHER): Payer: Medicare Other | Admitting: Internal Medicine

## 2011-11-14 ENCOUNTER — Encounter: Payer: Self-pay | Admitting: Internal Medicine

## 2011-11-14 VITALS — BP 138/80 | HR 77 | Temp 98.1°F | Wt 233.0 lb

## 2011-11-14 DIAGNOSIS — I10 Essential (primary) hypertension: Secondary | ICD-10-CM

## 2011-11-14 DIAGNOSIS — H409 Unspecified glaucoma: Secondary | ICD-10-CM | POA: Insufficient documentation

## 2011-11-14 DIAGNOSIS — Z0000 Encounter for general adult medical examination without abnormal findings: Secondary | ICD-10-CM

## 2011-11-14 DIAGNOSIS — Z125 Encounter for screening for malignant neoplasm of prostate: Secondary | ICD-10-CM

## 2011-11-14 LAB — CBC WITH DIFFERENTIAL/PLATELET
Basophils Absolute: 0 10*3/uL (ref 0.0–0.1)
Eosinophils Absolute: 0.1 10*3/uL (ref 0.0–0.7)
HCT: 38.3 % — ABNORMAL LOW (ref 39.0–52.0)
Lymphs Abs: 2.5 10*3/uL (ref 0.7–4.0)
MCV: 86.3 fl (ref 78.0–100.0)
Monocytes Absolute: 0.4 10*3/uL (ref 0.1–1.0)
Platelets: 242 10*3/uL (ref 150.0–400.0)
RDW: 14.8 % — ABNORMAL HIGH (ref 11.5–14.6)

## 2011-11-14 LAB — PSA: PSA: 2.97 ng/mL (ref 0.10–4.00)

## 2011-11-14 LAB — BASIC METABOLIC PANEL
BUN: 15 mg/dL (ref 6–23)
Chloride: 100 mEq/L (ref 96–112)
Creatinine, Ser: 1 mg/dL (ref 0.4–1.5)
Glucose, Bld: 94 mg/dL (ref 70–99)
Potassium: 4.2 mEq/L (ref 3.5–5.1)

## 2011-11-14 LAB — LIPID PANEL
Cholesterol: 183 mg/dL (ref 0–200)
Triglycerides: 167 mg/dL — ABNORMAL HIGH (ref 0.0–149.0)
VLDL: 33.4 mg/dL (ref 0.0–40.0)

## 2011-11-14 LAB — HEPATIC FUNCTION PANEL
Bilirubin, Direct: 0.1 mg/dL (ref 0.0–0.3)
Total Protein: 7 g/dL (ref 6.0–8.3)

## 2011-11-14 NOTE — Assessment & Plan Note (Signed)
BP Readings from Last 3 Encounters:  11/14/11 138/80  11/27/10 130/70  05/24/10 120/70   Good control No change Due for labs

## 2011-11-14 NOTE — Assessment & Plan Note (Signed)
Generally healthy Will do PSA after discussion Rx given for zostavax---he will check price

## 2011-11-14 NOTE — Progress Notes (Signed)
Subjective:    Patient ID: Matthew Carter, male    DOB: 03/16/1941, 71 y.o.   MRN: [id number]  HPI Doing well Here for physical  Tries to walk regularly Could be better with his eating  No depression or anhedonia No falls Hearing and vision are okay No concerns about memory  Current Outpatient Prescriptions on File Prior to Visit  Medication Sig Dispense Refill  . amLODipine (NORVASC) 5 MG tablet TAKE 1 TABLET BY MOUTH EVERY DAY FOR HIGH BLOOD PRESSURE  30 tablet  11  . polyethylene glycol (MIRALAX / GLYCOLAX) packet Take 17 g by mouth daily. To keep stools soft         No Known Allergies  Past Medical History  Diagnosis Date  . Allergy   . Glucose intolerance (impaired glucose tolerance)   . ED (erectile dysfunction)   . Hypertension   . Adenomatous polyps   . Hemorrhoids     No past surgical history on file.  Family History  Problem Relation Age of Onset  . GER disease Mother   . Hypertension Mother   . Stroke Father   . Hypertension Father   . Cancer Paternal Grandfather     Leukemia  . Diabetes Paternal Grandfather     History   Social History  . Marital Status: Married    Spouse Name: N/A    Number of Children: 3  . Years of Education: N/A   Occupational History  . Retired Librarian, academic   Social History Main Topics  . Smoking status: Former Smoker    Quit date: 08/04/1981  . Smokeless tobacco: Not on file  . Alcohol Use: No  . Drug Use:   . Sexually Active:    Other Topics Concern  . Not on file   Social History Narrative  . No narrative on file   Review of Systems  Constitutional: Negative for fatigue and unexpected weight change.       Taking cod liver oil daily Wears seat belt  HENT: Positive for congestion, rhinorrhea and tinnitus. Negative for hearing loss and dental problem.        Uses loratadine for allergies Regular with dentist  Eyes: Negative for visual disturbance.       Followed for  glaucoma--[facility] Is on med for this  Respiratory: Negative for cough, chest tightness and shortness of breath.        Cough only with cold symptoms  Cardiovascular: Negative for chest pain, palpitations and leg swelling.  Gastrointestinal: Negative for nausea, vomiting, abdominal pain, constipation and blood in stool.       No heartburn  Genitourinary: Negative for dysuria, urgency, frequency and difficulty urinating.       No sexual problems  Musculoskeletal: Positive for myalgias and back pain. Negative for arthralgias.       Pain in back down right leg at night---better when he wraps it in towel. Achy No daytime joint problems  Skin: Negative for rash.       No suspicious areas  Neurological: Negative for dizziness, syncope, weakness, light-headedness, numbness and headaches.  Hematological: Negative for adenopathy. Does not bruise/bleed easily.  Psychiatric/Behavioral: Negative for sleep disturbance and dysphoric mood. The patient is not nervous/anxious.        Objective:   Physical Exam  Constitutional: He is oriented to person, place, and time. He appears well-developed and well-nourished. No distress.  HENT:  Head: Normocephalic and atraumatic.  Right Ear: External ear normal.  Left Ear: External  ear normal.  Mouth/Throat: Oropharynx is clear and moist. No oropharyngeal exudate.  Eyes: Conjunctivae and EOM are normal. Pupils are equal, round, and reactive to light.  Neck: Normal range of motion. Neck supple. No thyromegaly present.  Cardiovascular: Normal rate, regular rhythm, normal heart sounds and intact distal pulses.  Exam reveals no gallop.   No murmur heard. Pulmonary/Chest: No respiratory distress. He has no wheezes. He has no rales.  Abdominal: Soft. There is no tenderness.  Musculoskeletal: He exhibits no edema and no tenderness.  Lymphadenopathy:    He has no cervical adenopathy.  Neurological: He is alert and oriented to person, place, and time.  Skin:  No rash noted. No erythema.  Psychiatric: He has a normal mood and affect. His behavior is normal.          Assessment & Plan:

## 2011-11-17 ENCOUNTER — Encounter: Payer: Self-pay | Admitting: *Deleted

## 2011-11-18 ENCOUNTER — Encounter: Payer: Medicare Other | Admitting: Internal Medicine

## 2012-03-17 ENCOUNTER — Encounter: Payer: Self-pay | Admitting: Family Medicine

## 2012-03-17 ENCOUNTER — Ambulatory Visit (INDEPENDENT_AMBULATORY_CARE_PROVIDER_SITE_OTHER): Payer: Medicare Other | Admitting: Family Medicine

## 2012-03-17 VITALS — BP 126/70 | HR 64 | Temp 98.1°F | Wt 232.5 lb

## 2012-03-17 DIAGNOSIS — R21 Rash and other nonspecific skin eruption: Secondary | ICD-10-CM

## 2012-03-17 NOTE — Progress Notes (Addendum)
  Subjective:    Patient ID: Matthew Carter, male    DOB: [birth year], 71 y.o.   MRN: [id number]  HPI CC: rash  1wk h/o rash that is present in arms.  Intermittently present, comes and goes.  Yesterday spread to upper arm.  Starts as pimple then spreads out like whelm.  Itchy and some burning.  Has tried hydrocortisone cream which has helped.  One day lips were swollen but benadryl helped with this.  Doesn't think related to food - no new foods recently. No new medicines recently.  Did use new lotion recently for hands - but this was after rash had started. Did use different soap recently but has stopped using this. No new shampoo, detergents.  About 1 mo ago when mowing lawn, thinks something may have stung him on left forearm, placed EtOH on in, no problems since. No oral lesions.  No fevers/chills  Works for [employer], recently started using chemical to strip wax off floors.  (stripper chemical).  May have gotten hand in it.  This was about 2-3 wks ago.   no one else at home with similar rash.  Review of Systems Per HPI    Objective:   Physical Exam WDWN AAM NAD Bilateral upper arms with slight erythematous macular rash, some erythematous papules left AC fossa. Nonvesicular.  No significant pruritis. No oral lesions No evident soft tissue or mucous membrane swelling present    Assessment & Plan:

## 2012-03-17 NOTE — Assessment & Plan Note (Signed)
Anticipate allergic reaction to something in environment, possibly recent chemicals or recent change in soap/lotions. Rec hydrocortisone cream and benadryl regularly for 5 days then may stop Discussed reasons to seek urgent care or return here.

## 2012-03-17 NOTE — Patient Instructions (Signed)
I think this is likely skin reaction to something in environment - possibly chemical you recently used. Do not use any new detergent/soap/lotions for now. I'd recommend using hydrocortisone cream twice daily for 5 days as well as benadryl nightly for 5 days then may stop and see if rash continued. If continued or worsening, let us know. Good to see you today, call us with quesitons.

## 2012-03-19 ENCOUNTER — Encounter: Payer: Self-pay | Admitting: Internal Medicine

## 2012-03-19 ENCOUNTER — Ambulatory Visit (INDEPENDENT_AMBULATORY_CARE_PROVIDER_SITE_OTHER): Payer: Medicare Other | Admitting: Internal Medicine

## 2012-03-19 VITALS — BP 148/80 | HR 84 | Temp 98.1°F | Wt 235.0 lb

## 2012-03-19 DIAGNOSIS — L239 Allergic contact dermatitis, unspecified cause: Secondary | ICD-10-CM

## 2012-03-19 DIAGNOSIS — L259 Unspecified contact dermatitis, unspecified cause: Secondary | ICD-10-CM

## 2012-03-19 MED ORDER — PREDNISONE 20 MG PO TABS: 40.0000 mg | ORAL_TABLET | Freq: Every day | ORAL | Status: AC

## 2012-03-19 NOTE — Patient Instructions (Addendum)
Please start cetirizine 10mg  daily till the reaction goes away. You can still use benedryl at bedtime

## 2012-03-19 NOTE — Assessment & Plan Note (Signed)
Some lip swelling but otherwise no systemic component He is tired of this Will treat with prednisone Add cetirizine

## 2012-03-19 NOTE — Progress Notes (Signed)
  Subjective:    Patient ID: Matthew Carter, male    DOB: 1941/04/27, 71 y.o.   MRN: [id number]  HPI Started with rash about a week or more ago Did use a new soap and noted itching so he stopped it  Rash on arms still but now some on legs Did have some lip swelling--lower last week, upper this weekend and again last night  No cough  No wheezing No SOB  Has burning sensation on arms  Current Outpatient Prescriptions on File Prior to Visit  Medication Sig Dispense Refill  . amLODipine (NORVASC) 5 MG tablet TAKE 1 TABLET BY MOUTH EVERY DAY FOR HIGH BLOOD PRESSURE  30 tablet  11  . polyethylene glycol (MIRALAX / GLYCOLAX) packet Take 17 g by mouth daily. To keep stools soft         No Known Allergies  Past Medical History  Diagnosis Date  . Allergy   . Glucose intolerance (impaired glucose tolerance)   . ED (erectile dysfunction)   . Hypertension   . Adenomatous polyps   . Hemorrhoids   . Glaucoma     No past surgical history on file.  Family History  Problem Relation Age of Onset  . GER disease Mother   . Hypertension Mother   . Stroke Father   . Hypertension Father   . Cancer Paternal Grandfather     Leukemia  . Diabetes Paternal Grandfather     History   Social History  . Marital Status: Married    Spouse Name: N/A    Number of Children: 3  . Years of Education: N/A   Occupational History  . Retired Librarian, academic   Social History Main Topics  . Smoking status: Former Smoker    Quit date: 08/04/1981  . Smokeless tobacco: Never Used  . Alcohol Use: No  . Drug Use: No  . Sexually Active: Not on file   Other Topics Concern  . Not on file   Social History Narrative  . No narrative on file   Review of Systems No nausea or vomiting Appetite is okay No fever     Objective:   Physical Exam  Constitutional: He appears well-developed and well-nourished. No distress.  HENT:  Mouth/Throat: Oropharynx is clear and moist. No  oropharyngeal exudate.       ?some upper lip swelling  Neck: Normal range of motion. Neck supple.  Pulmonary/Chest: Effort normal and breath sounds normal. No respiratory distress. He has no wheezes. He has no rales.  Lymphadenopathy:    He has no cervical adenopathy.  Skin:       Scattered rare raised lesions on arms---has urticarial look          Assessment & Plan:

## 2012-03-23 ENCOUNTER — Other Ambulatory Visit: Payer: Self-pay | Admitting: Internal Medicine

## 2012-03-29 ENCOUNTER — Encounter: Payer: Self-pay | Admitting: Family Medicine

## 2012-03-29 ENCOUNTER — Telehealth: Payer: Self-pay | Admitting: Internal Medicine

## 2012-03-29 ENCOUNTER — Ambulatory Visit (INDEPENDENT_AMBULATORY_CARE_PROVIDER_SITE_OTHER): Payer: Medicare Other | Admitting: Family Medicine

## 2012-03-29 VITALS — BP 138/78 | HR 72 | Temp 97.8°F | Wt 233.0 lb

## 2012-03-29 DIAGNOSIS — R21 Rash and other nonspecific skin eruption: Secondary | ICD-10-CM

## 2012-03-29 MED ORDER — FLUOCINONIDE-E 0.05 % EX CREA: TOPICAL_CREAM | Freq: Two times a day (BID) | CUTANEOUS | Status: DC

## 2012-03-29 NOTE — Telephone Encounter (Signed)
Triage Record Num: [id number] Operator: Terance Ice Patient Name: Matthew Carter Call Date & Time: 03/28/2012 2:27:01PM Patient Phone: [phone] PCP: Tillman Abide Patient Gender: Male PCP Fax : [phone] Patient DOB: 1941-05-24 Practice Name: [facility] Reason for Call: Caller: [name]/Patient; PCP: Tillman Abide (Family Practice); CB#: [phone]; Call regarding Rash ongoing about 3 weeks; burning itching sensation with red raised dime-sized rash under arms currently but states it comes and goes in different places; improves with rubbing alcohol; treated with prednisone and zyrtec; requesting "blood work to be done to see what is going on"; denies fever; Guideline: Rash; Disp: see provider within 72 hours for rash lasting more than 2 weeks; Appt? yes on 03/29/12 at 1015 with Dr. Ruthe Mannan. Protocol(s) Used: Rash Recommended Outcome per Protocol: See Provider within 72 Hours Reason for Outcome: Rash lasting more than 2 weeks Care Advice: ~ Do not scratch or rub lesions to decrease the chance of secondary infection. ~ Wear loose-fitting, non-restricting clothing. Cool/tepid showers or baths may help relieve itching. If cool water alone does not relieve itching, try adding 1/2 to 1 cup baking soda or colloidal oatmeal (Aveeno) to bath water. ~ Avoid use of perfumed or strong soaps, detergents or any product that is irritating to the skin. Only use mild, unscented soap (Aveeno, Neutrogena) for bathing to decrease irritation. ~ ~ Call provider immediately if symptoms worsen before seeing provider. ~ SYMPTOM / CONDITION MANAGEMENT See provider within 4 hours if having signs and symptoms of local infection such as redness or red streaks, tenderness, warmth, swelling, or purulent drainage). ~ ~ CAUTIONS For symptom relief, consider nonprescription antihistamines (such as Allerest, Claritin, Zyrtec, Chlor-Trimetron, Benadryl, etc.) as directed on label or by  pharmacist. Drowsiness may result, especially in geriatric patients. Non-sedating antihistamines are available without a prescription. ~ 03/28/2012 2:54:09PM Page 1 of 1 [email or web address]

## 2012-03-29 NOTE — Patient Instructions (Signed)
Great to meet you. Please stop by to see Shirlee Limerick on your way out- she will set up an appointment with an allergist. Continue to use zyrtec. I have also sent in a prescription for lidex.

## 2012-03-29 NOTE — Progress Notes (Signed)
Subjective:    Patient ID: Matthew Carter, male    DOB: 22-Jun-1941, 71 y.o.   MRN: [id number]  HPI 71 yo pt of Dr. Alphonsus Sias here for persist rash.  Notes reviewed. Has been seen here twice for this rash, most recently 8/16- Dr. Alphonsus Sias prescribed 10 day course of prednisone after rash had failed to improve with topical steroid cream and benadryl.  Zyrtec was also added. Pt felt that prednisone only helped a little.  Rash comes and goes-does not have rash today.  Started with rash several weeks ago. He had used a new soap and was also exposed to strong cleaning agents at work.  Rash occurs on arms only - "looks like welps" and is associated with itching a burning. Zyrtec and rubbing alcohol seem to help most.  When this first appeared, he did have some lip swelling which has not occurred since.  No cough  No wheezing No SOB  Has been on Norvasc for years.  He stopped taking it last week to see if this was the cause and symptoms did not improve.  Current Outpatient Prescriptions on File Prior to Visit  Medication Sig Dispense Refill  . amLODipine (NORVASC) 5 MG tablet TAKE 1 TABLET BY MOUTH EVERY DAY FOR HIGH BLOOD PRESSURE  30 tablet  11  . polyethylene glycol (MIRALAX / GLYCOLAX) packet Take 17 g by mouth daily. To keep stools soft       . predniSONE (DELTASONE) 20 MG tablet Take 2 tablets (40 mg total) by mouth daily.  15 tablet  0    No Known Allergies  Past Medical History  Diagnosis Date  . Allergy   . Glucose intolerance (impaired glucose tolerance)   . ED (erectile dysfunction)   . Hypertension   . Adenomatous polyps   . Hemorrhoids   . Glaucoma     No past surgical history on file.  Family History  Problem Relation Age of Onset  . GER disease Mother   . Hypertension Mother   . Stroke Father   . Hypertension Father   . Cancer Paternal Grandfather     Leukemia  . Diabetes Paternal Grandfather     History   Social History  . Marital Status: Married   Spouse Name: N/A    Number of Children: 3  . Years of Education: N/A   Occupational History  . Retired Librarian, academic   Social History Main Topics  . Smoking status: Former Smoker    Quit date: 08/04/1981  . Smokeless tobacco: Never Used  . Alcohol Use: No  . Drug Use: No  . Sexually Active: Not on file   Other Topics Concern  . Not on file   Social History Narrative  . No narrative on file   Review of Systems No nausea or vomiting Appetite is okay No fever     Objective:   Physical Exam  BP 138/78  Pulse 72  Temp 97.8 F (36.6 C)  Wt 233 lb (105.688 kg)  Constitutional: He appears well-developed and well-nourished. No distress.  HENT:  Mouth/Throat: Oropharynx is clear and moist. No oropharyngeal exudate.  Neck: Normal range of motion. Neck supple.  Pulmonary/Chest: Effort normal and breath sounds normal. No respiratory distress. He has no wheezes. He has no rales.  Lymphadenopathy:    He has no cervical adenopathy.  Skin:  No rash evident    Assessment & Plan:   1. Skin rash  Ambulatory referral to Allergy   Does seem  allergic with unclear allergen. Pt understandably would like to know what is causing this. Will refer to allergist for further work up. The patient indicates understanding of these issues and agrees with the plan.

## 2012-03-30 NOTE — Telephone Encounter (Signed)
See note

## 2012-04-26 ENCOUNTER — Ambulatory Visit (INDEPENDENT_AMBULATORY_CARE_PROVIDER_SITE_OTHER): Payer: Medicare Other

## 2012-04-26 DIAGNOSIS — Z23 Encounter for immunization: Secondary | ICD-10-CM

## 2012-05-28 ENCOUNTER — Other Ambulatory Visit: Payer: Self-pay

## 2012-05-28 NOTE — Telephone Encounter (Signed)
Due to change in insurance needs to change pharmacies. Pt wants to get amlodipine at [facility]; pt already has refills at [facility]. Pt will contact [facility] and have refills transferred.

## 2012-11-19 ENCOUNTER — Encounter: Payer: Medicare Other | Admitting: Internal Medicine

## 2012-11-24 ENCOUNTER — Ambulatory Visit (INDEPENDENT_AMBULATORY_CARE_PROVIDER_SITE_OTHER): Payer: Medicare Other | Admitting: Internal Medicine

## 2012-11-24 ENCOUNTER — Encounter: Payer: Self-pay | Admitting: Internal Medicine

## 2012-11-24 VITALS — BP 138/70 | HR 71 | Temp 98.0°F | Ht 72.0 in | Wt 237.0 lb

## 2012-11-24 DIAGNOSIS — Z23 Encounter for immunization: Secondary | ICD-10-CM

## 2012-11-24 DIAGNOSIS — I10 Essential (primary) hypertension: Secondary | ICD-10-CM

## 2012-11-24 DIAGNOSIS — J309 Allergic rhinitis, unspecified: Secondary | ICD-10-CM

## 2012-11-24 DIAGNOSIS — Z0000 Encounter for general adult medical examination without abnormal findings: Secondary | ICD-10-CM

## 2012-11-24 LAB — CBC WITH DIFFERENTIAL/PLATELET
Basophils Relative: 0.5 % (ref 0.0–3.0)
Eosinophils Relative: 0.9 % (ref 0.0–5.0)
HCT: 38.6 % — ABNORMAL LOW (ref 39.0–52.0)
Hemoglobin: 12.9 g/dL — ABNORMAL LOW (ref 13.0–17.0)
Lymphs Abs: 2.3 10*3/uL (ref 0.7–4.0)
MCV: 84 fl (ref 78.0–100.0)
Monocytes Absolute: 0.4 10*3/uL (ref 0.1–1.0)
Monocytes Relative: 7.2 % (ref 3.0–12.0)
Neutro Abs: 2.6 10*3/uL (ref 1.4–7.7)
Platelets: 257 10*3/uL (ref 150.0–400.0)
RBC: 4.59 Mil/uL (ref 4.22–5.81)
WBC: 5.4 10*3/uL (ref 4.5–10.5)

## 2012-11-24 LAB — BASIC METABOLIC PANEL
BUN: 11 mg/dL (ref 6–23)
Chloride: 104 mEq/L (ref 96–112)
Potassium: 4.1 mEq/L (ref 3.5–5.1)
Sodium: 138 mEq/L (ref 135–145)

## 2012-11-24 LAB — HEPATIC FUNCTION PANEL
ALT: 15 U/L (ref 0–53)
Bilirubin, Direct: 0 mg/dL (ref 0.0–0.3)
Total Bilirubin: 0.7 mg/dL (ref 0.3–1.2)
Total Protein: 7.1 g/dL (ref 6.0–8.3)

## 2012-11-24 NOTE — Assessment & Plan Note (Signed)
Okay with zyrtec in tree season

## 2012-11-24 NOTE — Assessment & Plan Note (Signed)
BP Readings from Last 3 Encounters:  11/24/12 138/70  03/29/12 138/78  03/19/12 148/80   Good control Due for labs

## 2012-11-24 NOTE — Patient Instructions (Signed)

## 2012-11-24 NOTE — Assessment & Plan Note (Addendum)
Doing well Td today Rx for zostavax Due for colon next year PSA okay last year---will reconsider for next year Discussed fitness-- DASH diet info given

## 2012-11-24 NOTE — Progress Notes (Signed)
Subjective:    Patient ID: Matthew Carter, male    DOB: 1941-06-05, 72 y.o.   MRN: [id number]  HPI Here for physical Reviewed advanced directives He has no other concerns  Due for Td Not sure about zostavax---will give Rx to check price  Tries to eat right Walks regularly with wife Weight up 4#  Current Outpatient Prescriptions on File Prior to Visit  Medication Sig Dispense Refill  . amLODipine (NORVASC) 5 MG tablet TAKE 1 TABLET BY MOUTH EVERY DAY FOR HIGH BLOOD PRESSURE  30 tablet  11   No current facility-administered medications on file prior to visit.    No Known Allergies  Past Medical History  Diagnosis Date  . Allergy   . Glucose intolerance (impaired glucose tolerance)   . ED (erectile dysfunction)   . Hypertension   . Adenomatous polyps   . Hemorrhoids   . Glaucoma     No past surgical history on file.  Family History  Problem Relation Age of Onset  . GER disease Mother   . Hypertension Mother   . Stroke Father   . Hypertension Father   . Cancer Paternal Grandfather     Leukemia  . Diabetes Paternal Grandfather   . Cancer Brother     throat cancer    History   Social History  . Marital Status: Married    Spouse Name: N/A    Number of Children: 3  . Years of Education: N/A   Occupational History  . Retired Librarian, academic   Social History Main Topics  . Smoking status: Former Smoker    Quit date: 08/04/1981  . Smokeless tobacco: Never Used  . Alcohol Use: No  . Drug Use: No  . Sexually Active: Not on file   Other Topics Concern  . Not on file   Social History Narrative   Has living will    No formal health care POA but requests wife   Would accept resuscitation attempts but no prolonged artificial ventilation   Would accept a feeding tube   Review of Systems  Constitutional: Negative for fatigue and unexpected weight change.       Wears seat belt  HENT: Positive for hearing loss, rhinorrhea and  tinnitus. Negative for congestion and dental problem.        Full dentures  Eyes: Negative for visual disturbance.       No diplopia or unilateral vision change On drops for glaucoma  Respiratory: Negative for cough, chest tightness and shortness of breath.   Cardiovascular: Negative for chest pain, palpitations and leg swelling.       Stamina has slowly dwindled  Gastrointestinal: Negative for nausea and vomiting.       No heartburn  Endocrine: Negative for cold intolerance and heat intolerance.  Genitourinary: Negative for urgency, frequency and difficulty urinating.       Nocturia x 3 usually Mild ED --but still okay  Musculoskeletal: Positive for back pain. Negative for joint swelling and arthralgias.       Back pain if prolonged standing  Skin: Negative for rash.       No suspicious lesions  Allergic/Immunologic:       Tree sensitive--- zyrtec helps  Neurological: Negative for dizziness, syncope, weakness, light-headedness, numbness and headaches.  Hematological: Negative for adenopathy. Does not bruise/bleed easily.  Psychiatric/Behavioral: Negative for sleep disturbance and dysphoric mood. The patient is not nervous/anxious.        Objective:   Physical Exam  Constitutional:  He is oriented to person, place, and time. He appears well-developed and well-nourished. No distress.  HENT:  Head: Normocephalic and atraumatic.  Right Ear: External ear normal.  Left Ear: External ear normal.  Mouth/Throat: Oropharynx is clear and moist. No oropharyngeal exudate.  Eyes: Conjunctivae and EOM are normal. Pupils are equal, round, and reactive to light.  Neck: Normal range of motion. Neck supple. No thyromegaly present.  Cardiovascular: Normal rate, regular rhythm, normal heart sounds and intact distal pulses.  Exam reveals no gallop.   No murmur heard. Pulmonary/Chest: Effort normal and breath sounds normal. No respiratory distress. He has no wheezes. He has no rales.  Abdominal:  Soft. There is no tenderness.  Musculoskeletal: He exhibits no edema and no tenderness.  Lymphadenopathy:    He has no cervical adenopathy.  Neurological: He is alert and oriented to person, place, and time.  Skin: No rash noted. No erythema.  Psychiatric: He has a normal mood and affect. His behavior is normal.          Assessment & Plan:

## 2012-11-24 NOTE — Addendum Note (Signed)
Addended by: Sueanne Margarita on: 11/24/2012 11:28 AM   Modules accepted: Orders

## 2012-11-25 ENCOUNTER — Encounter: Payer: Self-pay | Admitting: *Deleted

## 2013-03-26 ENCOUNTER — Other Ambulatory Visit: Payer: Self-pay | Admitting: Internal Medicine

## 2013-04-12 ENCOUNTER — Ambulatory Visit (INDEPENDENT_AMBULATORY_CARE_PROVIDER_SITE_OTHER): Payer: Medicare Other | Admitting: Internal Medicine

## 2013-04-12 ENCOUNTER — Encounter: Payer: Self-pay | Admitting: Internal Medicine

## 2013-04-12 VITALS — BP 138/70 | HR 81 | Temp 97.8°F | Wt 237.0 lb

## 2013-04-12 DIAGNOSIS — L723 Sebaceous cyst: Secondary | ICD-10-CM

## 2013-04-12 MED ORDER — CEPHALEXIN 500 MG PO CAPS: 500.0000 mg | ORAL_CAPSULE | Freq: Four times a day (QID) | ORAL | Status: DC

## 2013-04-12 NOTE — Assessment & Plan Note (Signed)
Not clearly infected but recent increase in size makes antibiotic course reasonable Firm without fluctuance so nothing to drain  May not decrease in size much---may need to consider referral to surgeon for excision

## 2013-04-12 NOTE — Patient Instructions (Signed)
Please call if the cyst doesn't decrease in size a lot---I can refer you to a surgeon to remove it.

## 2013-04-12 NOTE — Progress Notes (Signed)
  Subjective:    Patient ID: Matthew Carter, male    DOB: 02/19/1941, 72 y.o.   MRN: [id number]  HPI Has a boil under his left arm again Last happened 2-3 years ago--- needed I&D  No fever No drainage  No Rx---other than trying some alcohol Started in past week  Generally doesn't use deodorant  Current Outpatient Prescriptions on File Prior to Visit  Medication Sig Dispense Refill  . amLODipine (NORVASC) 5 MG tablet TAKE 1 TABLET BY MOUTH EVERY DAY FOR BLOOD PRESSURE  30 tablet  8  . latanoprost (XALATAN) 0.005 % ophthalmic solution Place 1 drop into both eyes daily.        No current facility-administered medications on file prior to visit.    No Known Allergies  Past Medical History  Diagnosis Date  . Allergy   . Glucose intolerance (impaired glucose tolerance)   . ED (erectile dysfunction)   . Hypertension   . Adenomatous polyps   . Hemorrhoids   . Glaucoma     No past surgical history on file.  Family History  Problem Relation Age of Onset  . GER disease Mother   . Hypertension Mother   . Stroke Father   . Hypertension Father   . Cancer Paternal Grandfather     Leukemia  . Diabetes Paternal Grandfather   . Cancer Brother     throat cancer    History   Social History  . Marital Status: Married    Spouse Name: N/A    Number of Children: 3  . Years of Education: N/A   Occupational History  . Retired Librarian, academic   Social History Main Topics  . Smoking status: Former Smoker    Quit date: 08/04/1981  . Smokeless tobacco: Never Used  . Alcohol Use: No  . Drug Use: No  . Sexual Activity: Not on file   Other Topics Concern  . Not on file   Social History Narrative   Has living will    No formal health care POA but requests wife   Would accept resuscitation attempts but no prolonged artificial ventilation   Would accept a feeding tube   Review of Systems No swollen glands No nausea, appetite is okay    Objective:    Physical Exam  Constitutional: He appears well-developed and well-nourished. No distress.  Skin:  ~3cm diameter mass in left axilla No nodes there Not particularly inflamed Not really tender          Assessment & Plan:

## 2013-04-14 ENCOUNTER — Encounter: Payer: Self-pay | Admitting: Internal Medicine

## 2013-04-18 ENCOUNTER — Encounter: Payer: Self-pay | Admitting: Internal Medicine

## 2013-04-28 ENCOUNTER — Ambulatory Visit (INDEPENDENT_AMBULATORY_CARE_PROVIDER_SITE_OTHER): Payer: Medicare Other

## 2013-04-28 DIAGNOSIS — Z23 Encounter for immunization: Secondary | ICD-10-CM

## 2013-05-11 ENCOUNTER — Telehealth: Payer: Self-pay

## 2013-05-11 NOTE — Telephone Encounter (Signed)
Pt was seen 04/12/13; axillary cyst burst 1 week after seen; cyst has decreased a lot in size and is not hurting or bothering pt; but if pt squeezes cyst pus still comes out; pt does not think he needs to see surgeon but wants to know if should take an antibiotic. No fever. [facility]. Pt request cb.

## 2013-05-11 NOTE — Telephone Encounter (Signed)
If it is not red, inflamed or tender--he probably doesn't need an antibiotic If he is unsure, we can schedule an appt Warm compresses may help empty the rest of the stuff out of the cyst

## 2013-05-12 NOTE — Telephone Encounter (Signed)
Pt left v/m requesting cb [phone].

## 2013-05-12 NOTE — Telephone Encounter (Signed)
Spoke with patient and he scheduled an appt for tomorrow with Dr. Reece Agar

## 2013-05-13 ENCOUNTER — Ambulatory Visit (INDEPENDENT_AMBULATORY_CARE_PROVIDER_SITE_OTHER): Payer: Medicare Other | Admitting: Family Medicine

## 2013-05-13 ENCOUNTER — Encounter: Payer: Self-pay | Admitting: Family Medicine

## 2013-05-13 VITALS — BP 142/74 | HR 76 | Temp 98.1°F | Wt 236.2 lb

## 2013-05-13 DIAGNOSIS — L723 Sebaceous cyst: Secondary | ICD-10-CM

## 2013-05-13 NOTE — Progress Notes (Signed)
  Subjective:    Patient ID: Matthew Carter, male    DOB: August 22, 1940, 72 y.o.   MRN: [id number]  HPI CC: recheck arm  Seen here 9/9 by PCP with concern for L axillary sebaceous cyst, treated with keflex course.  Discussed possible surgery referral if not improving.   1 wk after seen, cyst ruptured and drained.    Smaller in size, has been resolving. Not tender, red or swollen.  Some persistent drainage.  Past Medical History  Diagnosis Date  . Allergy   . Glucose intolerance (impaired glucose tolerance)   . ED (erectile dysfunction)   . Hypertension   . Adenomatous polyps   . Hemorrhoids   . Glaucoma     Review of Systems Per HPI    Objective:   Physical Exam  Nursing note and vitals reviewed. Constitutional: He appears well-developed and well-nourished. No distress.  Skin:     Left axilla with healing sebaceous cyst, noninflamed, nontender.  Mild induration. Superior to this is small pore that drains serous fluid when squeezed. Lateral to cyst is erythemtous patch at site of prior bandage.       Assessment & Plan:

## 2013-05-13 NOTE — Assessment & Plan Note (Signed)
Smaller, resolving, but persistent drainage. No infection currently, no need for further abx.  Discussed this. Give more time - if continues to drain, concern for developing sinus tract connected to cyst - and would refer to surgery.  Advised pt to call us in 1-2 wks with update. Anticipate small erythematous patch reaction to bandage, not infection.

## 2013-05-13 NOTE — Patient Instructions (Signed)
Continue warm compresses. I don't think this needs another antibiotic course. If persistent drainage past next 1-2 wks, let us know for referral to surgeon. Hopefully will heal on its own.

## 2013-06-14 ENCOUNTER — Ambulatory Visit (AMBULATORY_SURGERY_CENTER): Payer: Commercial Managed Care - HMO | Admitting: *Deleted

## 2013-06-14 VITALS — Ht 71.0 in | Wt 237.2 lb

## 2013-06-14 DIAGNOSIS — Z86010 Personal history of colonic polyps: Secondary | ICD-10-CM

## 2013-06-14 MED ORDER — NA SULFATE-K SULFATE-MG SULF 17.5-3.13-1.6 GM/177ML PO SOLN
1.0000 | Freq: Once | ORAL | Status: DC
Start: 2013-06-14 — End: 2013-06-28

## 2013-06-14 NOTE — Progress Notes (Signed)
No allergies to eggs or soy. No prior anesthesia.  

## 2013-06-16 ENCOUNTER — Encounter: Payer: Self-pay | Admitting: Internal Medicine

## 2013-06-28 ENCOUNTER — Ambulatory Visit (AMBULATORY_SURGERY_CENTER): Payer: Medicare Other | Admitting: Internal Medicine

## 2013-06-28 ENCOUNTER — Encounter: Payer: Self-pay | Admitting: Internal Medicine

## 2013-06-28 VITALS — BP 138/77 | HR 68 | Temp 97.2°F | Resp 15 | Ht 71.0 in | Wt 237.0 lb

## 2013-06-28 DIAGNOSIS — D126 Benign neoplasm of colon, unspecified: Secondary | ICD-10-CM

## 2013-06-28 DIAGNOSIS — Z860100 Personal history of colon polyps, unspecified: Secondary | ICD-10-CM | POA: Insufficient documentation

## 2013-06-28 MED ORDER — SODIUM CHLORIDE 0.9 % IV SOLN: 500.0000 mL | INTRAVENOUS | Status: DC

## 2013-06-28 NOTE — Op Note (Signed)
[facility] [address] [place], [address]   COLONOSCOPY PROCEDURE REPORT  PATIENT: Matthew Carter, Matthew Carter  MR#: [id number] BIRTHDATE: 05/24/1941 , 72  yrs. old GENDER: Male ENDOSCOPIST: Iva Boop, MD, [facility] PROCEDURE DATE:  06/28/2013 PROCEDURE:   Colonoscopy with biopsy and snare polypectomy First Screening Colonoscopy - Avg.  risk and is 50 yrs.  old or older - No.  Prior Negative Screening - Now for repeat screening. N/A  History of Adenoma - Now for follow-up colonoscopy & has been > or = to 3 yrs.  Yes hx of adenoma.  Has been 3 or more years since last colonoscopy.  Polyps Removed Today? Yes. ASA CLASS:   Class II INDICATIONS:Patient's personal history of adenomatous colon polyps.  MEDICATIONS: propofol (Diprivan) 200mg  IV, MAC sedation, administered by CRNA, and These medications were titrated to patient response per physician's verbal order  DESCRIPTION OF PROCEDURE:   After the risks benefits and alternatives of the procedure were thoroughly explained, informed consent was obtained.  A digital rectal exam revealed no abnormalities of the rectum, A digital rectal exam revealed no prostatic nodules, and A digital rectal exam revealed the prostate was not enlarged.   The [id number]  endoscope was introduced through the anus and advanced to the cecum, which was identified by both the appendix and ileocecal valve. No adverse events experienced.   The quality of the prep was excellent using Suprep  The instrument was then slowly withdrawn as the colon was fully examined.  COLON FINDINGS: Three diminutive sessile polyps were found at the cecum and in the ascending colon.  A polypectomy was performed with cold forceps and with a cold snare.  The resection was complete and the polyp tissue was completely retrieved.   There was mild scattered diverticulosis noted in the ascending colon.   The colon mucosa was otherwise normal.  Retroflexed views  revealed no abnormalities. The time to cecum=5 minutes 50 seconds.  Withdrawal time=10 minutes 35 seconds.  The scope was withdrawn and the procedure completed. COMPLICATIONS: There were no complications.  ENDOSCOPIC IMPRESSION: 1.   Three diminutive sessile polyps were found at the cecum and in the ascending colon; polypectomy was performed with cold forceps and with a cold snare 2.   There was mild diverticulosis noted in the ascending colon 3.   The colon mucosa was otherwise normal - EXCELLENT PREP  RECOMMENDATIONS: Timing of repeat colonoscopy will be determined by pathology findings in pt w/ prior adenomas 2010 and 2011 (9 total, max 3 cm TV adenoma)  eSigned:  Iva Boop, MD, [facility] 06/28/2013 11:24 AM  cc: The Patient

## 2013-06-28 NOTE — Progress Notes (Signed)
Called to room to assist during endoscopic procedure.  Patient ID and intended procedure confirmed with present staff. Received instructions for my participation in the procedure from the performing physician.  

## 2013-06-28 NOTE — Patient Instructions (Addendum)
I found and removed 3 small polyps that look benign. I will let you know pathology results and when to have another routine colonoscopy by mail.   Have a good Thanksgiving!  I appreciate the opportunity to care for you. Iva Boop, MD, [facility]   Discharge instructions given with verbal understanding. Handouts on polyps and diverticulosis. Resume previous medications. YOU HAD AN ENDOSCOPIC PROCEDURE TODAY AT THE [facility]: Refer to the procedure report that was given to you for any specific questions about what was found during the examination.  If the procedure report does not answer your questions, please call your gastroenterologist to clarify.  If you requested that your care partner not be given the details of your procedure findings, then the procedure report has been included in a sealed envelope for you to review at your convenience later.  YOU SHOULD EXPECT: Some feelings of bloating in the abdomen. Passage of more gas than usual.  Walking can help get rid of the air that was put into your GI tract during the procedure and reduce the bloating. If you had a lower endoscopy (such as a colonoscopy or flexible sigmoidoscopy) you may notice spotting of blood in your stool or on the toilet paper. If you underwent a bowel prep for your procedure, then you may not have a normal bowel movement for a few days.  DIET: Your first meal following the procedure should be a light meal and then it is ok to progress to your normal diet.  A half-sandwich or bowl of soup is an example of a good first meal.  Heavy or fried foods are harder to digest and may make you feel nauseous or bloated.  Likewise meals heavy in dairy and vegetables can cause extra gas to form and this can also increase the bloating.  Drink plenty of fluids but you should avoid alcoholic beverages for 24 hours.  ACTIVITY: Your care partner should take you home directly after the procedure.  You should plan to take it easy,  moving slowly for the rest of the day.  You can resume normal activity the day after the procedure however you should NOT DRIVE or use heavy machinery for 24 hours (because of the sedation medicines used during the test).    SYMPTOMS TO REPORT IMMEDIATELY: A gastroenterologist can be reached at any hour.  During normal business hours, 8:30 AM to 5:00 PM Monday through Friday, call [phone].  After hours and on weekends, please call the GI answering service at [phone] who will take a message and have the physician on call contact you.   Following lower endoscopy (colonoscopy or flexible sigmoidoscopy):  Excessive amounts of blood in the stool  Significant tenderness or worsening of abdominal pains  Swelling of the abdomen that is new, acute  Fever of 100F or higher  FOLLOW UP: If any biopsies were taken you will be contacted by phone or by letter within the next 1-3 weeks.  Call your gastroenterologist if you have not heard about the biopsies in 3 weeks.  Our staff will call the home number listed on your records the next business day following your procedure to check on you and address any questions or concerns that you may have at that time regarding the information given to you following your procedure. This is a courtesy call and so if there is no answer at the home number and we have not heard from you through the emergency physician on call, we will  assume that you have returned to your regular daily activities without incident.  SIGNATURES/CONFIDENTIALITY: You and/or your care partner have signed paperwork which will be entered into your electronic medical record.  These signatures attest to the fact that that the information above on your After Visit Summary has been reviewed and is understood.  Full responsibility of the confidentiality of this discharge information lies with you and/or your care-partner.

## 2013-06-28 NOTE — Progress Notes (Signed)
Patient did not experience any of the following events: a burn prior to discharge; a fall within the facility; wrong site/side/patient/procedure/implant event; or a hospital transfer or hospital admission upon discharge from the facility. (G8907) Patient did not have preoperative order for IV antibiotic SSI prophylaxis. ([id number])  

## 2013-06-28 NOTE — Progress Notes (Signed)
Report to pacu rn, vss, bbs=clear 

## 2013-06-29 ENCOUNTER — Telehealth: Payer: Self-pay | Admitting: *Deleted

## 2013-06-29 NOTE — Telephone Encounter (Signed)
Message left

## 2013-07-05 ENCOUNTER — Encounter: Payer: Self-pay | Admitting: Internal Medicine

## 2013-07-05 NOTE — Progress Notes (Signed)
Quick Note:  Diminutive adenomas x 2-3 Repeat colon 2019 ______

## 2013-07-05 NOTE — Progress Notes (Signed)
Quick Note:  Diminutive sessile serrated polyp x 2 - correction ______

## 2013-08-03 ENCOUNTER — Other Ambulatory Visit: Payer: Self-pay

## 2013-08-03 NOTE — Telephone Encounter (Signed)
No  Eye doctor needs to prescribe then  Okay amlodipine for a year

## 2013-08-03 NOTE — Telephone Encounter (Signed)
Pt getting set up with [facility] and request written rx for amlodipine,latanoprost and timolol. Call pt when rx ready for pick up.

## 2013-08-03 NOTE — Telephone Encounter (Signed)
No answer at home number phone just rang, will try again later

## 2013-08-03 NOTE — Telephone Encounter (Signed)
Should we be doing the eye drops?

## 2013-08-08 MED ORDER — AMLODIPINE BESYLATE 5 MG PO TABS: ORAL_TABLET | ORAL | Status: DC

## 2013-08-08 NOTE — Telephone Encounter (Signed)
Please send the 1 and deny the others Leave him a message about needing the eye doctor to prescribe

## 2013-08-08 NOTE — Telephone Encounter (Signed)
Spoke with wife and and advised results rx sent to pharmacy by e-script

## 2013-08-22 ENCOUNTER — Ambulatory Visit: Payer: Medicare Other | Admitting: Internal Medicine

## 2013-08-26 ENCOUNTER — Other Ambulatory Visit: Payer: Self-pay | Admitting: *Deleted

## 2013-08-26 MED ORDER — AMLODIPINE BESYLATE 5 MG PO TABS: ORAL_TABLET | ORAL | Status: DC

## 2013-09-02 ENCOUNTER — Encounter: Payer: Self-pay | Admitting: Internal Medicine

## 2013-09-02 ENCOUNTER — Ambulatory Visit (INDEPENDENT_AMBULATORY_CARE_PROVIDER_SITE_OTHER): Payer: Medicare HMO | Admitting: Internal Medicine

## 2013-09-02 VITALS — BP 148/80 | HR 77 | Temp 98.0°F | Wt 242.0 lb

## 2013-09-02 DIAGNOSIS — M722 Plantar fascial fibromatosis: Secondary | ICD-10-CM | POA: Insufficient documentation

## 2013-09-02 NOTE — Progress Notes (Signed)
Pre-visit discussion using our clinic review tool. No additional management support is needed unless otherwise documented below in the visit note.  

## 2013-09-02 NOTE — Patient Instructions (Signed)
Please get new stability shoes for walking with arch support inserts.  Plantar Fasciitis Plantar fasciitis is a common condition that causes foot pain. It is soreness (inflammation) of the band of tough fibrous tissue on the bottom of the foot that runs from the heel bone (calcaneus) to the ball of the foot. The cause of this soreness may be from excessive standing, poor fitting shoes, running on hard surfaces, being overweight, having an abnormal walk, or overuse (this is common in runners) of the painful foot or feet. It is also common in aerobic exercise dancers and ballet dancers. SYMPTOMS  Most people with plantar fasciitis complain of:  Severe pain in the morning on the bottom of their foot especially when taking the first steps out of bed. This pain recedes after a few minutes of walking.  Severe pain is experienced also during walking following a long period of inactivity.  Pain is worse when walking barefoot or up stairs DIAGNOSIS   Your caregiver will diagnose this condition by examining and feeling your foot.  Special tests such as X-rays of your foot, are usually not needed. PREVENTION   Consult a sports medicine professional before beginning a new exercise program.  Walking programs offer a good workout. With walking there is a lower chance of overuse injuries common to runners. There is less impact and less jarring of the joints.  Begin all new exercise programs slowly. If problems or pain develop, decrease the amount of time or distance until you are at a comfortable level.  Wear good shoes and replace them regularly.  Stretch your foot and the heel cords at the back of the ankle (Achilles tendon) both before and after exercise.  Run or exercise on even surfaces that are not hard. For example, asphalt is better than pavement.  Do not run barefoot on hard surfaces.  If using a treadmill, vary the incline.  Do not continue to workout if you have foot or joint problems.  Seek professional help if they do not improve. HOME CARE INSTRUCTIONS   Avoid activities that cause you pain until you recover.  Use ice or cold packs on the problem or painful areas after working out.  Only take over-the-counter or prescription medicines for pain, discomfort, or fever as directed by your caregiver.  Soft shoe inserts or athletic shoes with air or gel sole cushions may be helpful.  If problems continue or become more severe, consult a sports medicine caregiver or your own health care provider. Cortisone is a potent anti-inflammatory medication that may be injected into the painful area. You can discuss this treatment with your caregiver. MAKE SURE YOU:   Understand these instructions.  Will watch your condition.  Will get help right away if you are not doing well or get worse. Document Released: 04/15/2001 Document Revised: 10/13/2011 Document Reviewed: 06/14/2008 [facility] Patient Information 2014 [place], [place].

## 2013-09-02 NOTE — Progress Notes (Signed)
   Subjective:    Patient ID: Matthew Carter, male    DOB: [birth year], 73 y.o.   MRN: [id number]  HPI Having pain in left heel After sitting, it will be tender when he first gets up Seems to ease up after walking Started about 3-4 months  No known injury Hasn't tried any therapy--other than trying a new pair of shoes  Current Outpatient Prescriptions on File Prior to Visit  Medication Sig Dispense Refill  . amLODipine (NORVASC) 5 MG tablet TAKE 1 TABLET BY MOUTH EVERY DAY FOR BLOOD PRESSURE  90 tablet  3  . latanoprost (XALATAN) 0.005 % ophthalmic solution Place 1 drop into both eyes daily.       . timolol (BETIMOL) 0.5 % ophthalmic solution 1 drop daily.       No current facility-administered medications on file prior to visit.    No Known Allergies  Past Medical History  Diagnosis Date  . Allergy   . Glucose intolerance (impaired glucose tolerance)   . ED (erectile dysfunction)   . Hypertension   . Adenomatous polyps   . Hemorrhoids   . Glaucoma     Past Surgical History  Procedure Laterality Date  . No prior surgery      Family History  Problem Relation Age of Onset  . GER disease Mother   . Hypertension Mother   . Stroke Father   . Hypertension Father   . Cancer Paternal Grandfather     Leukemia  . Diabetes Paternal Grandfather   . Cancer Brother     throat cancer  . Colon cancer Neg Hx     History   Social History  . Marital Status: Married    Spouse Name: N/A    Number of Children: 3  . 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 Smoking status: Former Smoker    Quit date: 08/04/1981  . Smokeless tobacco: Never Used  . Alcohol Use: No  . Drug Use: No  . Sexual Activity: Not on file   Other Topics Concern  . Not on file   Social History Narrative   Has living will    No formal health care POA but requests wife   Would accept resuscitation attempts  but no prolonged artificial ventilation   Would accept a feeding tube   Review of Systems No swollen joints Not sick or any fever    Objective:   Physical Exam  Constitutional: He appears well-developed and well-nourished. No distress.  Musculoskeletal:  No heel swelling or tenderness Achilles is normal          Assessment & Plan:

## 2013-09-02 NOTE — Assessment & Plan Note (Signed)
Classic but fortunately fairly mild He has preserved arches and shoes without support Discussed new stability shoes NSAIDs prn

## 2013-11-29 ENCOUNTER — Encounter: Payer: Self-pay | Admitting: Internal Medicine

## 2013-11-29 ENCOUNTER — Ambulatory Visit (INDEPENDENT_AMBULATORY_CARE_PROVIDER_SITE_OTHER): Payer: Medicare HMO | Admitting: Internal Medicine

## 2013-11-29 VITALS — BP 132/78 | HR 73 | Temp 97.9°F | Ht 72.9 in | Wt 240.5 lb

## 2013-11-29 DIAGNOSIS — Z23 Encounter for immunization: Secondary | ICD-10-CM

## 2013-11-29 DIAGNOSIS — Z0000 Encounter for general adult medical examination without abnormal findings: Secondary | ICD-10-CM

## 2013-11-29 DIAGNOSIS — I10 Essential (primary) hypertension: Secondary | ICD-10-CM

## 2013-11-29 DIAGNOSIS — H409 Unspecified glaucoma: Secondary | ICD-10-CM

## 2013-11-29 LAB — COMPREHENSIVE METABOLIC PANEL
ALBUMIN: 3.7 g/dL (ref 3.5–5.2)
ALK PHOS: 87 U/L (ref 39–117)
ALT: 20 U/L (ref 0–53)
AST: 21 U/L (ref 0–37)
BUN: 13 mg/dL (ref 6–23)
CHLORIDE: 101 meq/L (ref 96–112)
CO2: 27 mEq/L (ref 19–32)
Calcium: 9.9 mg/dL (ref 8.4–10.5)
Creatinine, Ser: 1.1 mg/dL (ref 0.4–1.5)
GFR: 86.62 mL/min (ref >60.00)
Glucose, Bld: 103 mg/dL — ABNORMAL HIGH (ref 70–99)
POTASSIUM: 4.2 meq/L (ref 3.5–5.1)
SODIUM: 137 meq/L (ref 135–145)
Total Bilirubin: 0.4 mg/dL (ref 0.3–1.2)
Total Protein: 7.4 g/dL (ref 6.0–8.3)

## 2013-11-29 LAB — LIPID PANEL
CHOL/HDL RATIO: 4
CHOLESTEROL: 161 mg/dL (ref 0–200)
HDL: 43.1 mg/dL (ref >39.00)
LDL CALC: 94 mg/dL (ref 0–99)
Triglycerides: 119 mg/dL (ref 0.0–149.0)
VLDL: 23.8 mg/dL (ref 0.0–40.0)

## 2013-11-29 LAB — CBC WITH DIFFERENTIAL/PLATELET
Basophils Absolute: 0 10*3/uL (ref 0.0–0.1)
Basophils Relative: 0.6 % (ref 0.0–3.0)
EOS ABS: 0.1 10*3/uL (ref 0.0–0.7)
Eosinophils Relative: 1.5 % (ref 0.0–5.0)
HCT: 38.8 % — ABNORMAL LOW (ref 39.0–52.0)
Hemoglobin: 12.9 g/dL — ABNORMAL LOW (ref 13.0–17.0)
Lymphocytes Relative: 39.4 % (ref 12.0–46.0)
Lymphs Abs: 2 10*3/uL (ref 0.7–4.0)
MCHC: 33.2 g/dL (ref 30.0–36.0)
MCV: 85.5 fl (ref 78.0–100.0)
MONO ABS: 0.3 10*3/uL (ref 0.1–1.0)
Monocytes Relative: 6.7 % (ref 3.0–12.0)
NEUTROS PCT: 51.8 % (ref 43.0–77.0)
Neutro Abs: 2.7 10*3/uL (ref 1.4–7.7)
Platelets: 267 10*3/uL (ref 150.0–400.0)
RBC: 4.54 Mil/uL (ref 4.22–5.81)
RDW: 14.8 % — AB (ref 11.5–14.6)
WBC: 5.2 10*3/uL (ref 4.5–10.5)

## 2013-11-29 LAB — TSH: TSH: 0.35 u[IU]/mL (ref 0.35–5.50)

## 2013-11-29 LAB — T4, FREE: Free T4: 1.02 ng/dL (ref 0.60–1.60)

## 2013-11-29 MED ORDER — ZOSTER VACCINE LIVE 19400 UNT/0.65ML ~~LOC~~ SOLR: 0.6500 mL | Freq: Once | SUBCUTANEOUS | Status: DC

## 2013-11-29 NOTE — Progress Notes (Signed)
Subjective:    Patient ID: Matthew Carter, male    DOB: [birth year], 73 y.o.   MRN: [id number]  HPI Here for physical No new concerns Foot is better with new shoes with better support  Continues on drops for glaucoma Pressure has been well controlled  Still does some part time work at [employer] occasionally Tries to eat healthy  Current Outpatient Prescriptions on File Prior to Visit  Medication Sig Dispense Refill  . amLODipine (NORVASC) 5 MG tablet TAKE 1 TABLET BY MOUTH EVERY DAY FOR BLOOD PRESSURE  90 tablet  3  . latanoprost (XALATAN) 0.005 % ophthalmic solution Place 1 drop into both eyes daily.       . timolol (BETIMOL) 0.5 % ophthalmic solution 1 drop daily.       No current facility-administered medications on file prior to visit.    No Known Allergies  Past Medical History  Diagnosis Date  . Allergy   . Glucose intolerance (impaired glucose tolerance)   . ED (erectile dysfunction)   . Hypertension   . Adenomatous polyps   . Hemorrhoids   . Glaucoma     Past Surgical History  Procedure Laterality Date  . No prior surgery      Family History  Problem Relation Age of Onset  . GER disease Mother   . Hypertension Mother   . Stroke Father   . Hypertension Father   . Cancer Paternal Grandfather     Leukemia  . Diabetes Paternal Grandfather   . Cancer Brother     throat cancer  . Colon cancer Neg Hx     History   Social History  . Marital Status: Married    Spouse Name: N/A    Number of Children: 3  . 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 Smoking status: Former Smoker    Quit date: 08/04/1981  . Smokeless tobacco: Never Used  . Alcohol Use: No  . Drug Use: No  . Sexual Activity: Not on file   Other Topics Concern  . Not on file   Social History Narrative   Has living will    No formal health care POA but requests wife   Would  accept resuscitation attempts but no prolonged artificial ventilation   Would accept a feeding tube   Review of Systems  Constitutional: Negative for fatigue and unexpected weight change.       Wears seat belt  HENT: Positive for tinnitus. Negative for dental problem and hearing loss.        Full dentures No oral sores  Eyes: Negative for visual disturbance.       No diplopia or unilateral vision loss  Respiratory: Negative for cough, chest tightness and shortness of breath.   Cardiovascular: Negative for chest pain, palpitations and leg swelling.  Gastrointestinal: Negative for nausea, vomiting, abdominal pain, constipation and blood in stool.       No heartburn  Endocrine: Negative for cold intolerance and heat intolerance.  Genitourinary: Negative for urgency and difficulty urinating.       No sex--no problem  Musculoskeletal: Negative for arthralgias, back pain and joint swelling.       Slight AM stiffness  Skin: Negative for rash.       No suspicious lesions  Allergic/Immunologic: Positive for environmental allergies. Negative for immunocompromised state.       Only mild symptoms --hasn't needed meds  Neurological: Negative for dizziness, syncope, weakness, light-headedness, numbness and headaches.  Hematological: Negative for adenopathy. Does not bruise/bleed easily.  Psychiatric/Behavioral: Negative for sleep disturbance and dysphoric mood. The patient is not nervous/anxious.        Objective:   Physical Exam  Constitutional: He is oriented to person, place, and time. He appears well-developed and well-nourished. No distress.  HENT:  Head: Normocephalic and atraumatic.  Right Ear: External ear normal.  Left Ear: External ear normal.  Mouth/Throat: Oropharynx is clear and moist. No oropharyngeal exudate.  Eyes: Conjunctivae and EOM are normal. Pupils are equal, round, and reactive to light.  Neck: Normal range of motion. Neck supple. No thyromegaly present.    Cardiovascular: Normal rate, regular rhythm, normal heart sounds and intact distal pulses.  Exam reveals no gallop.   No murmur heard. Pulmonary/Chest: Effort normal and breath sounds normal. No respiratory distress. He has no wheezes. He has no rales.  Abdominal: Soft. There is no tenderness.  Musculoskeletal: He exhibits no edema and no tenderness.  Lymphadenopathy:    He has no cervical adenopathy.  Neurological: He is alert and oriented to person, place, and time.  Skin: No rash noted. No erythema.  Psychiatric: He has a normal mood and affect. His behavior is normal.          Assessment & Plan:

## 2013-11-29 NOTE — Progress Notes (Signed)
Pre visit review using our clinic review tool, if applicable. No additional management support is needed unless otherwise documented below in the visit note. 

## 2013-11-29 NOTE — Assessment & Plan Note (Signed)
Healthy but needs to work on fitness Info given [place] again for zostavax

## 2013-11-29 NOTE — Assessment & Plan Note (Signed)
BP Readings from Last 3 Encounters:  11/29/13 132/78  09/02/13 148/80  06/28/13 138/77   Good control No changes needed

## 2013-11-29 NOTE — Assessment & Plan Note (Signed)
Doing well on Rx.

## 2013-11-29 NOTE — Patient Instructions (Signed)
Exercise to Lose Weight Exercise and a healthy diet may help you lose weight. Your doctor may suggest specific exercises. EXERCISE IDEAS AND TIPS  Choose low-cost things you enjoy doing, such as walking, bicycling, or exercising to workout videos.  Take stairs instead of the elevator.  Walk during your lunch break.  Park your car further away from work or school.  Go to a gym or an exercise class.  Start with 5 to 10 minutes of exercise each day. Build up to 30 minutes of exercise 4 to 6 days a week.  Wear shoes with good support and comfortable clothes.  Stretch before and after working out.  Work out until you breathe harder and your heart beats faster.  Drink extra water when you exercise.  Do not do so much that you hurt yourself, feel dizzy, or get very short of breath. Exercises that burn about 150 calories:  Running 1  [place] in 15 minutes.  Playing volleyball for 45 to 60 minutes.  Washing and waxing a car for 45 to 60 minutes.  Playing touch football for 45 minutes.  Walking 1  [place] in 35 minutes.  Pushing a stroller 1  [place] in 30 minutes.  Playing basketball for 30 minutes.  Raking leaves for 30 minutes.  Bicycling 5 [place] in 30 minutes.  Walking 2 [place] in 30 minutes.  Dancing for 30 minutes.  Shoveling snow for 15 minutes.  Swimming laps for 20 minutes.  Walking up stairs for 15 minutes.  Bicycling 4 [place] in 15 minutes.  Gardening for 30 to 45 minutes.  Jumping rope for 15 minutes.  Washing windows or floors for 45 to 60 minutes. Document Released: 08/23/2010 Document Revised: 10/13/2011 Document Reviewed: 08/23/2010 [facility] Patient Information 2014 [place], [place]. DASH Diet The DASH diet stands for "Dietary Approaches to Stop Hypertension." It is a healthy eating plan that has been shown to reduce high blood pressure (hypertension) in as little as 14 days, while also possibly providing other significant health benefits. These other  health benefits include reducing the risk of breast cancer after menopause and reducing the risk of type 2 diabetes, heart disease, colon cancer, and stroke. Health benefits also include weight loss and slowing kidney failure in patients with chronic kidney disease.  DIET GUIDELINES  Limit salt (sodium). Your diet should contain less than 1500 mg of sodium daily.  Limit refined or processed carbohydrates. Your diet should include mostly whole grains. Desserts and added sugars should be used sparingly.  Include small amounts of heart-healthy fats. These types of fats include nuts, oils, and tub margarine. Limit saturated and trans fats. These fats have been shown to be harmful in the body. CHOOSING FOODS  The following food groups are based on a 2000 calorie diet. See your Registered Dietitian for individual calorie needs. Grains and Grain Products (6 to 8 servings daily)  Eat More Often: Whole-wheat bread, brown rice, whole-grain or wheat pasta, quinoa, popcorn without added fat or salt (air popped).  Eat Less Often: White bread, white pasta, white rice, cornbread. Vegetables (4 to 5 servings daily)  Eat More Often: Fresh, frozen, and canned vegetables. Vegetables may be raw, steamed, roasted, or grilled with a minimal amount of fat.  Eat Less Often/Avoid: Creamed or fried vegetables. Vegetables in a cheese sauce. Fruit (4 to 5 servings daily)  Eat More Often: All fresh, canned (in natural juice), or frozen fruits. Dried fruits without added sugar. One hundred percent fruit juice ( cup [237 mL] daily).  Eat Less Often: Dried fruits with added sugar. Canned fruit in light or heavy syrup. [employer], Fish, and Poultry (2 servings or less daily. One serving is 3 to 4 oz [85-114 g]).  Eat More Often: Ninety percent or leaner ground beef, tenderloin, sirloin. Round cuts of beef, chicken breast, [place] breast. All fish. Grill, bake, or broil your meat. Nothing should be fried.  Eat Less  Often/Avoid: Fatty cuts of meat, [place], or chicken leg, thigh, or wing. Fried cuts of meat or fish. Dairy (2 to 3 servings)  Eat More Often: Low-fat or fat-free milk, low-fat plain or light yogurt, reduced-fat or part-skim cheese.  Eat Less Often/Avoid: Milk (whole, 2%).Whole milk yogurt. Full-fat cheeses. Nuts, Seeds, and Legumes (4 to 5 servings per week)  Eat More Often: All without added salt.  Eat Less Often/Avoid: Salted nuts and seeds, canned beans with added salt. Fats and Sweets (limited)  Eat More Often: Vegetable oils, tub margarines without trans fats, sugar-free gelatin. Mayonnaise and salad dressings.  Eat Less Often/Avoid: Coconut oils, palm oils, butter, stick margarine, cream, half and half, cookies, candy, pie. FOR MORE INFORMATION The Dash Diet Eating Plan: www.dashdiet.org Document Released: 07/10/2011 Document Revised: 10/13/2011 Document Reviewed: 07/10/2011 [facility] Patient Information 2014 [place], [place].

## 2013-11-29 NOTE — Addendum Note (Signed)
Addended by: Ander Gaster B on: 11/29/2013 09:40 AM   Modules accepted: Orders

## 2013-11-30 ENCOUNTER — Encounter: Payer: Self-pay | Admitting: Family Medicine

## 2013-12-06 ENCOUNTER — Ambulatory Visit (INDEPENDENT_AMBULATORY_CARE_PROVIDER_SITE_OTHER): Payer: Commercial Managed Care - HMO | Admitting: Family Medicine

## 2013-12-06 ENCOUNTER — Encounter: Payer: Self-pay | Admitting: Family Medicine

## 2013-12-06 VITALS — BP 116/60 | HR 89 | Temp 98.1°F | Ht 72.9 in | Wt 240.5 lb

## 2013-12-06 DIAGNOSIS — B9789 Other viral agents as the cause of diseases classified elsewhere: Principal | ICD-10-CM

## 2013-12-06 DIAGNOSIS — J069 Acute upper respiratory infection, unspecified: Secondary | ICD-10-CM

## 2013-12-06 MED ORDER — GUAIFENESIN-CODEINE 100-10 MG/5ML PO SYRP: 5.0000 mL | ORAL_SOLUTION | Freq: Every evening | ORAL | Status: DC | PRN

## 2013-12-06 NOTE — Progress Notes (Signed)
Pre visit review using our clinic review tool, if applicable. No additional management support is needed unless otherwise documented below in the visit note. 

## 2013-12-06 NOTE — Patient Instructions (Signed)
Start mucinex DM during the day, prescription cough suppressant at night.  Call if not improving as expected in 7-10 days, call sooner if any shortness of breath.

## 2013-12-06 NOTE — Progress Notes (Signed)
   Subjective:    Patient ID: Matthew Carter, male    DOB: [birth year], 73 y.o.   MRN: [id number]  Cough This is a new problem. The current episode started in the past 7 days (4 days). The problem has been gradually improving. The cough is non-productive. Associated symptoms include postnasal drip and a sore throat. Pertinent negatives include no chills, ear congestion, ear pain, fever, headaches, myalgias, nasal congestion, rhinorrhea, shortness of breath or wheezing. Associated symptoms comments: sneezing. The symptoms are aggravated by lying down (cough keeping him up at night). Risk factors for lung disease include smoking/tobacco exposure (former smoker 15 pack year history). He has tried OTC cough suppressant for the symptoms. The treatment provided mild relief. His past medical history is significant for environmental allergies. There is no history of asthma, bronchiectasis, bronchitis, COPD, emphysema or pneumonia.      Review of Systems  Constitutional: Negative for fever and chills.  HENT: Positive for postnasal drip and sore throat. Negative for ear pain and rhinorrhea.   Respiratory: Positive for cough. Negative for shortness of breath and wheezing.   Musculoskeletal: Negative for myalgias.  Allergic/Immunologic: Positive for environmental allergies.  Neurological: Negative for headaches.       Objective:   Physical Exam  Constitutional: Vital signs are normal. He appears well-developed and well-nourished.  Non-toxic appearance. He does not appear ill. No distress.  HENT:  Head: Normocephalic and atraumatic.  Right Ear: Hearing, tympanic membrane, external ear and ear canal normal. No tenderness. No foreign bodies. Tympanic membrane is not retracted and not bulging.  Left Ear: Hearing, tympanic membrane, external ear and ear canal normal. No tenderness. No foreign bodies. Tympanic membrane is not retracted and not bulging.  Nose: Nose normal. No mucosal edema or rhinorrhea.  Right sinus exhibits no maxillary sinus tenderness and no frontal sinus tenderness. Left sinus exhibits no maxillary sinus tenderness and no frontal sinus tenderness.  Mouth/Throat: Uvula is midline, oropharynx is clear and moist and mucous membranes are normal. Normal dentition. No dental caries. No oropharyngeal exudate or tonsillar abscesses.  Eyes: Conjunctivae, EOM and lids are normal. Pupils are equal, round, and reactive to light. Lids are everted and swept, no foreign bodies found.  Neck: Trachea normal, normal range of motion and phonation normal. Neck supple. Carotid bruit is not present. No mass and no thyromegaly present.  Cardiovascular: Normal rate, regular rhythm, S1 normal, S2 normal, normal heart sounds, intact distal pulses and normal pulses.  Exam reveals no gallop.   No murmur heard. Pulmonary/Chest: Effort normal and breath sounds normal. No respiratory distress. He has no wheezes. He has no rhonchi. He has no rales.  Abdominal: Soft. Normal appearance and bowel sounds are normal. There is no hepatosplenomegaly. There is no tenderness. There is no rebound, no guarding and no CVA tenderness. No hernia.  Neurological: He is alert. He has normal reflexes.  Skin: Skin is warm, dry and intact. No rash noted.  Psychiatric: He has a normal mood and affect. His speech is normal and behavior is normal. Judgment normal.          Assessment & Plan:

## 2013-12-06 NOTE — Assessment & Plan Note (Signed)
Symptom care. 

## 2014-02-26 ENCOUNTER — Emergency Department: Payer: Self-pay | Admitting: Emergency Medicine

## 2014-02-26 LAB — CBC WITH DIFFERENTIAL/PLATELET
BASOS ABS: 0 10*3/uL (ref 0.0–0.1)
Basophil %: 0.4 %
EOS ABS: 0 10*3/uL (ref 0.0–0.7)
EOS PCT: 0.7 %
HCT: 40.7 % (ref 40.0–52.0)
HGB: 13 g/dL (ref 13.0–18.0)
Lymphocyte #: 1.3 10*3/uL (ref 1.0–3.6)
Lymphocyte %: 22.1 %
MCH: 27.8 pg (ref 26.0–34.0)
MCHC: 32 g/dL (ref 32.0–36.0)
MCV: 87 fL (ref 80–100)
Monocyte #: 0.4 x10 3/mm (ref 0.2–1.0)
Monocyte %: 6.3 %
NEUTROS PCT: 70.5 %
Neutrophil #: 4 10*3/uL (ref 1.4–6.5)
Platelet: 237 10*3/uL (ref 150–440)
RBC: 4.68 10*6/uL (ref 4.40–5.90)
RDW: 14.5 % (ref 11.5–14.5)
WBC: 5.7 10*3/uL (ref 3.8–10.6)

## 2014-02-26 LAB — LIPASE, BLOOD: LIPASE: 217 U/L (ref 73–393)

## 2014-02-26 LAB — COMPREHENSIVE METABOLIC PANEL
ALK PHOS: 115 U/L
Albumin: 3.3 g/dL — ABNORMAL LOW (ref 3.4–5.0)
Anion Gap: 5 — ABNORMAL LOW (ref 7–16)
BUN: 12 mg/dL (ref 7–18)
Bilirubin,Total: 0.5 mg/dL (ref 0.2–1.0)
CREATININE: 1.19 mg/dL (ref 0.60–1.30)
Calcium, Total: 9.2 mg/dL (ref 8.5–10.1)
Chloride: 104 mmol/L (ref 98–107)
Co2: 32 mmol/L (ref 21–32)
EGFR (African American): >60
EGFR (Non-African Amer.): >60
Glucose: 139 mg/dL — ABNORMAL HIGH (ref 65–99)
Osmolality: 283 (ref 275–301)
Potassium: 4.3 mmol/L (ref 3.5–5.1)
SGOT(AST): 148 U/L — ABNORMAL HIGH (ref 15–37)
SGPT (ALT): 86 U/L — ABNORMAL HIGH
Sodium: 141 mmol/L (ref 136–145)
Total Protein: 7.7 g/dL (ref 6.4–8.2)

## 2014-04-17 ENCOUNTER — Ambulatory Visit: Payer: Commercial Managed Care - HMO

## 2014-04-19 ENCOUNTER — Ambulatory Visit (INDEPENDENT_AMBULATORY_CARE_PROVIDER_SITE_OTHER): Payer: Commercial Managed Care - HMO

## 2014-04-19 DIAGNOSIS — Z23 Encounter for immunization: Secondary | ICD-10-CM

## 2014-07-05 ENCOUNTER — Telehealth: Payer: Self-pay

## 2014-07-05 NOTE — Telephone Encounter (Signed)
Please check on him today--find out what happened

## 2014-07-05 NOTE — Telephone Encounter (Signed)
.  left message to have patient return my call.  

## 2014-07-05 NOTE — Telephone Encounter (Signed)
PLEASE NOTE: All timestamps contained within this report are represented as [place] Standard Time. CONFIDENTIALTY NOTICE: This fax transmission is intended only for the addressee. It contains information that is legally privileged, confidential or otherwise protected from use or disclosure. If you are not the intended recipient, you are strictly prohibited from reviewing, disclosing, copying using or disseminating any of this information or taking any action in reliance on or regarding this information. If you have received this fax in error, please notify us immediately by telephone so that we can arrange for its return to [place]. Phone: [phone], Toll-Free: [phone], Fax: [phone] Page: 1 of 2 Call Id: [id number] Cook Patient Name: Matthew Carter Gender: Male DOB: 1941/07/11 Age: 73 Y 10 M 7 D Return Phone Number: [phone] (Primary), [phone] (Secondary) Address: [address] [place] City/State/Zip: [place] [address] Client [facility] - Client Client Site [place] Physician Viviana Simpler Contact Type Call Call Type Triage / Clinical Relationship To Patient Self Return Phone Number [phone] (Primary) Chief Complaint Foot Pain Initial Comment Caller states his big toe and the two toes beside it on his right foot are hurting to the point he can't walk. PreDisposition Go to ED Nurse Assessment Nurse: Gershon Mussel, RN, Caryl Pina Date/Time Eilene Ghazi Time): 07/04/2014 7:21:38 PM Confirm and document reason for call. If symptomatic, describe symptoms. ---Caller states: "his big toe and the two toes beside it on his right foot are hurting and swollen and cannot hardly walk on it." Has the patient traveled out of the country within the last 30 days? ---No Does the patient require triage? ---Yes Related visit to  physician within the last 2 weeks? ---No Does the PT have any chronic conditions? (i.e. diabetes, asthma, etc.) ---No Guidelines Guideline Title Affirmed Question Affirmed Notes Nurse Date/Time Eilene Ghazi Time) Foot Pain [1] SEVERE pain (e.g., excruciating, unable to do any normal activities) AND [2] not improved after 2 hours of pain medicine Nelda Severe 07/04/2014 7:23:14 PM Disp. Time Eilene Ghazi Time) Disposition Final User 07/04/2014 7:26:10 PM See Physician within 4 Hours (or PCP triage) Yes Moses, RN, Hulan Saas Understands: Yes Disagree/Comply: Comply PLEASE NOTE: All timestamps contained within this report are represented as [place] Standard Time. CONFIDENTIALTY NOTICE: This fax transmission is intended only for the addressee. It contains information that is legally privileged, confidential or otherwise protected from use or disclosure. If you are not the intended recipient, you are strictly prohibited from reviewing, disclosing, copying using or disseminating any of this information or taking any action in reliance on or regarding this information. If you have received this fax in error, please notify us immediately by telephone so that we can arrange for its return to [place]. Phone: [phone], Toll-Free: [phone], Fax: [phone] Page: 2 of 2 Call Id: [id number] Care Advice Given Per Guideline SEE PHYSICIAN WITHIN 4 HOURS (or PCP triage): * IF NO PCP TRIAGE: You need to be seen. Go to _______________ (ED/UCC or office if it will be open) within the next 3 or 4 hours. Go sooner if you become worse. PAIN MEDICINES: ACETAMINOPHEN (E.G., TYLENOL): IBUPROFEN (E.G., MOTRIN, ADVIL): CALL BACK IF: * You become worse. CARE ADVICE given per Foot Pain (Adult) guideline. Referrals [facility] - ED

## 2014-07-06 ENCOUNTER — Encounter: Payer: Self-pay | Admitting: Internal Medicine

## 2014-07-06 ENCOUNTER — Ambulatory Visit (INDEPENDENT_AMBULATORY_CARE_PROVIDER_SITE_OTHER): Payer: Commercial Managed Care - HMO | Admitting: Internal Medicine

## 2014-07-06 VITALS — BP 136/74 | HR 77 | Temp 97.9°F | Wt 238.0 lb

## 2014-07-06 DIAGNOSIS — M79671 Pain in right foot: Secondary | ICD-10-CM

## 2014-07-06 DIAGNOSIS — M25471 Effusion, right ankle: Secondary | ICD-10-CM

## 2014-07-06 DIAGNOSIS — M79674 Pain in right toe(s): Secondary | ICD-10-CM

## 2014-07-06 DIAGNOSIS — M7989 Other specified soft tissue disorders: Secondary | ICD-10-CM

## 2014-07-06 LAB — BASIC METABOLIC PANEL
BUN: 16 mg/dL (ref 6–23)
CO2: 29 mEq/L (ref 19–32)
Calcium: 9.3 mg/dL (ref 8.4–10.5)
Chloride: 103 mEq/L (ref 96–112)
Creatinine, Ser: 1.2 mg/dL (ref 0.4–1.5)
GFR: 78.84 mL/min (ref >60.00)
Glucose, Bld: 110 mg/dL — ABNORMAL HIGH (ref 70–99)
POTASSIUM: 4.2 meq/L (ref 3.5–5.1)
Sodium: 139 mEq/L (ref 135–145)

## 2014-07-06 LAB — URIC ACID: URIC ACID, SERUM: 7.7 mg/dL (ref 4.0–7.8)

## 2014-07-06 NOTE — Patient Instructions (Signed)

## 2014-07-06 NOTE — Telephone Encounter (Signed)
Pt came in today to see [facility]

## 2014-07-06 NOTE — Progress Notes (Signed)
Pre visit review using our clinic review tool, if applicable. No additional management support is needed unless otherwise documented below in the visit note. 

## 2014-07-06 NOTE — Progress Notes (Signed)
Subjective:    Patient ID: Matthew Carter, male    DOB: 16-May-1941, 73 y.o.   MRN: [id number]  HPI  Pt presents to the clinic today with c/o pain in his first, second and third toe on his right foot. This started 2 days ago. He has noticed some associated swelling. He has pain with weight bearing. The pain is not a severe when he is sitting or lying down. He denies any injury to the area. He has no history of gout. He has tried soaking it in epsom salt with some relief. He reports the pain is not nearly as bad as it was.  Review of Systems      Past Medical History  Diagnosis Date  . Allergy   . Glucose intolerance (impaired glucose tolerance)   . ED (erectile dysfunction)   . Hypertension   . Adenomatous polyps   . Hemorrhoids   . Glaucoma     Current Outpatient Prescriptions  Medication Sig Dispense Refill  . amLODipine (NORVASC) 5 MG tablet TAKE 1 TABLET BY MOUTH EVERY DAY FOR BLOOD PRESSURE 90 tablet 3  . guaiFENesin-codeine (ROBITUSSIN AC) 100-10 MG/5ML syrup Take 5-10 mLs by mouth at bedtime as needed for cough. 180 mL 0  . latanoprost (XALATAN) 0.005 % ophthalmic solution Place 1 drop into both eyes daily.     . timolol (BETIMOL) 0.5 % ophthalmic solution 1 drop daily.    Marland Kitchen zoster vaccine live, PF, (ZOSTAVAX) 18299 UNT/0.65ML injection Inject 19,400 Units into the skin once. 1 each 0   No current facility-administered medications for this visit.    No Known Allergies  Family History  Problem Relation Age of Onset  . GER disease Mother   . Hypertension Mother   . Stroke Father   . Hypertension Father   . Cancer Paternal Grandfather     Leukemia  . Diabetes Paternal Grandfather   . Cancer Brother     throat cancer  . Colon cancer Neg Hx     History   Social History  . Marital Status: Married    Spouse Name: N/A    Number of Children: 3  . Years of Education: N/A   Occupational History  . Retired Control and instrumentation engineer   Social  History Main Topics  . Smoking status: Former Smoker    Quit date: 08/04/1981  . Smokeless tobacco: Never Used  . Alcohol Use: No  . Drug Use: No  . Sexual Activity: Not on file   Other Topics Concern  . Not on file   Social History Narrative   Has living will    No formal health care POA but requests wife   Would accept resuscitation attempts but no prolonged artificial ventilation   Would accept a feeding tube     Constitutional: Denies fever, malaise, fatigue, headache or abrupt weight changes.  Musculoskeletal: Pt reports joint pain and swelling. Denies muscle pain.  Skin: Denies redness, rashes, lesions or ulcercations.  Neurological: Denies numbness or tingling in his feet or problems with balance and coordination.   No other specific complaints in a complete review of systems (except as listed in HPI above).  Objective:   Physical Exam  Wt 238 lb (107.956 kg) Wt Readings from Last 3 Encounters:  07/06/14 238 lb (107.956 kg)  12/06/13 240 lb 8 oz (109.09 kg)  11/29/13 240 lb 8 oz (109.09 kg)    General: Appears his stated age, well developed, well nourished in NAD. Skin: Warm, dry  and intact. No erythema noted of the toes on the right foot. Cardiovascular: Normal rate and rhythm. S1,S2 noted.  No murmur, rubs or gallops noted. Pedal pulses 2+ bilaterally. 2+ swelling of the right ankle/foot. Pulmonary/Chest: Normal effort and positive vesicular breath sounds. No respiratory distress. No wheezes, rales or ronchi noted.  Musculoskeletal: Normal flexion and extension of the ankle and toes. Mild pain with palpation at the base of the 1st and second metatarsals. Pain with palpation just lateral to the ball of the foot. Slightly ataxic gait. Neurological: Sensation intact to bilateral feet.  BMET    Component Value Date/Time   NA 137 11/29/2013 0936   K 4.2 11/29/2013 0936   CL 101 11/29/2013 0936   CO2 27 11/29/2013 0936   GLUCOSE 103* 11/29/2013 0936   BUN 13  11/29/2013 0936   CREATININE 1.1 11/29/2013 0936   CALCIUM 9.9 11/29/2013 0936   GFRNONAA 83.06 05/24/2010 0839   GFRAA 86 08/01/2008 0818    Lipid Panel     Component Value Date/Time   CHOL 161 11/29/2013 0936   TRIG 119.0 11/29/2013 0936   HDL 43.10 11/29/2013 0936   CHOLHDL 4 11/29/2013 0936   VLDL 23.8 11/29/2013 0936   LDLCALC 94 11/29/2013 0936    CBC    Component Value Date/Time   WBC 5.2 11/29/2013 0936   RBC 4.54 11/29/2013 0936   HGB 12.9* 11/29/2013 0936   HCT 38.8* 11/29/2013 0936   PLT 267.0 11/29/2013 0936   MCV 85.5 11/29/2013 0936   MCHC 33.2 11/29/2013 0936   RDW 14.8* 11/29/2013 0936   LYMPHSABS 2.0 11/29/2013 0936   MONOABS 0.3 11/29/2013 0936   EOSABS 0.1 11/29/2013 0936   BASOSABS 0.0 11/29/2013 0936    Hgb A1C Lab Results  Component Value Date   HGBA1C 6.2 05/24/2010        Assessment & Plan:   Pain/swelling in toes of right foot/ankle:  I do not think this is gout, but he would like the blood test to check Will order BMET and uric acid levels ? If this could be due to the norvasc- but will continue for now Wrapped his foot and ankle in an ace wrap for compression- advised him to keep this on during the day and take off at night Advised him to keep it elevated as much as possible Take Ibuprofen for pain/inflammation  Will follow up with you after the labs are back, RTC as needed or if pain/swelling persist or worsen

## 2014-07-11 ENCOUNTER — Ambulatory Visit (INDEPENDENT_AMBULATORY_CARE_PROVIDER_SITE_OTHER): Payer: Commercial Managed Care - HMO | Admitting: Internal Medicine

## 2014-07-11 ENCOUNTER — Encounter: Payer: Self-pay | Admitting: Internal Medicine

## 2014-07-11 ENCOUNTER — Ambulatory Visit (INDEPENDENT_AMBULATORY_CARE_PROVIDER_SITE_OTHER)
Admission: RE | Admit: 2014-07-11 | Discharge: 2014-07-11 | Disposition: A | Discharge status: Home / Self Care | Payer: Commercial Managed Care - HMO | Source: Ambulatory Visit | Attending: Internal Medicine | Admitting: Internal Medicine

## 2014-07-11 VITALS — BP 138/80 | HR 100 | Temp 98.0°F | Wt 242.0 lb

## 2014-07-11 DIAGNOSIS — M79671 Pain in right foot: Secondary | ICD-10-CM | POA: Insufficient documentation

## 2014-07-11 NOTE — Assessment & Plan Note (Signed)
Really doesn't seem to be gout Will check x-ray Increase ibuprofen and consider trial of colchicine just in case--if it persists

## 2014-07-11 NOTE — Progress Notes (Signed)
Pre visit review using our clinic review tool, if applicable. No additional management support is needed unless otherwise documented below in the visit note. 

## 2014-07-11 NOTE — Patient Instructions (Signed)
Please increase the ibuprofen to 2-3 tabs up to three times a day till your pain is better.

## 2014-07-11 NOTE — Progress Notes (Signed)
   Subjective:    Patient ID: JHALEN ELEY, male    DOB: 1940/12/14, 73 y.o.   MRN: [id number]  HPI Here for swelling still in right foot Not really painful--just mostly swollen Toes are sore also This started a week ago Foot around ankle is swollen   No injury Has tried ibuprofen 200mg  every few hours (5 per day)  Current Outpatient Prescriptions on File Prior to Visit  Medication Sig Dispense Refill  . amLODipine (NORVASC) 5 MG tablet TAKE 1 TABLET BY MOUTH EVERY DAY FOR BLOOD PRESSURE 90 tablet 3  . latanoprost (XALATAN) 0.005 % ophthalmic solution Place 1 drop into both eyes daily.     . timolol (BETIMOL) 0.5 % ophthalmic solution 1 drop daily.     No current facility-administered medications on file prior to visit.    No Known Allergies  Past Medical History  Diagnosis Date  . Allergy   . Glucose intolerance (impaired glucose tolerance)   . ED (erectile dysfunction)   . Hypertension   . Adenomatous polyps   . Hemorrhoids   . Glaucoma     Past Surgical History  Procedure Laterality Date  . No prior surgery      Family History  Problem Relation Age of Onset  . GER disease Mother   . Hypertension Mother   . Stroke Father   . Hypertension Father   . Cancer Paternal Grandfather     Leukemia  . Diabetes Paternal Grandfather   . Cancer Brother     throat cancer  . Colon cancer Neg Hx     History   Social History  . Marital Status: Married    Spouse Name: N/A    Number of Children: 3  . 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 Smoking status: Former Smoker    Quit date: 08/04/1981  . Smokeless tobacco: Never Used  . Alcohol Use: No  . Drug Use: No  . Sexual Activity: Not on file   Other Topics Concern  . Not on file   Social History Narrative   Has living will    No formal health care POA but requests wife   Would accept resuscitation attempts but no  prolonged artificial ventilation   Would accept a feeding tube   Review of Systems No fever No other joint problems    Objective:   Physical Exam  Constitutional: He appears well-developed and well-nourished. No distress.  Musculoskeletal:  Slight puffiness in right ankle No inflammation anywhere Very slight tenderness at 2nd and 3rd MTPs--but very little          Assessment & Plan:

## 2014-07-12 ENCOUNTER — Encounter: Payer: Self-pay | Admitting: *Deleted

## 2014-09-19 ENCOUNTER — Other Ambulatory Visit: Payer: Self-pay | Admitting: Internal Medicine

## 2014-10-17 DIAGNOSIS — H4011X1 Primary open-angle glaucoma, mild stage: Secondary | ICD-10-CM | POA: Diagnosis not present

## 2014-12-01 ENCOUNTER — Encounter: Payer: Self-pay | Admitting: Internal Medicine

## 2014-12-01 ENCOUNTER — Ambulatory Visit (INDEPENDENT_AMBULATORY_CARE_PROVIDER_SITE_OTHER): Payer: Commercial Managed Care - HMO | Admitting: Internal Medicine

## 2014-12-01 VITALS — BP 130/80 | HR 81 | Temp 98.3°F | Ht 73.0 in | Wt 238.0 lb

## 2014-12-01 DIAGNOSIS — Z7189 Other specified counseling: Secondary | ICD-10-CM

## 2014-12-01 DIAGNOSIS — I10 Essential (primary) hypertension: Secondary | ICD-10-CM

## 2014-12-01 DIAGNOSIS — Z0000 Encounter for general adult medical examination without abnormal findings: Secondary | ICD-10-CM

## 2014-12-01 DIAGNOSIS — R7309 Other abnormal glucose: Secondary | ICD-10-CM

## 2014-12-01 DIAGNOSIS — R7303 Prediabetes: Secondary | ICD-10-CM | POA: Insufficient documentation

## 2014-12-01 NOTE — Assessment & Plan Note (Signed)
Has cut back on simple sugars Discussed proper eating and exercise

## 2014-12-01 NOTE — Patient Instructions (Signed)
DASH Eating Plan °DASH stands for "Dietary Approaches to Stop Hypertension." The DASH eating plan is a healthy eating plan that has been shown to reduce high blood pressure (hypertension). Additional health benefits may include reducing the risk of type 2 diabetes mellitus, heart disease, and stroke. The DASH eating plan may also help with weight loss. °WHAT DO I NEED TO KNOW ABOUT THE DASH EATING PLAN? °For the DASH eating plan, you will follow these general guidelines: °· Choose foods with a percent daily value for sodium of less than 5% (as listed on the food label). °· Use salt-free seasonings or herbs instead of table salt or sea salt. °· Check with your health care provider or pharmacist before using salt substitutes. °· Eat lower-sodium products, often labeled as "lower sodium" or "no salt added." °· Eat fresh foods. °· Eat more vegetables, fruits, and low-fat dairy products. °· Choose whole grains. Look for the word "whole" as the first word in the ingredient list. °· Choose fish and skinless chicken or turkey more often than red meat. Limit fish, poultry, and meat to 6 oz (170 g) each day. °· Limit sweets, desserts, sugars, and sugary drinks. °· Choose heart-healthy fats. °· Limit cheese to 1 oz (28 g) per day. °· Eat more home-cooked food and less restaurant, buffet, and fast food. °· Limit fried foods. °· Cook foods using methods other than frying. °· Limit canned vegetables. If you do use them, rinse them well to decrease the sodium. °· When eating at a restaurant, ask that your food be prepared with less salt, or no salt if possible. °WHAT FOODS CAN I EAT? °Seek help from a dietitian for individual calorie needs. °Grains °Whole grain or whole wheat bread. Brown rice. Whole grain or whole wheat pasta. Quinoa, bulgur, and whole grain cereals. Low-sodium cereals. Corn or whole wheat flour tortillas. Whole grain cornbread. Whole grain crackers. Low-sodium crackers. °Vegetables °Fresh or frozen vegetables  (raw, steamed, roasted, or grilled). Low-sodium or reduced-sodium tomato and vegetable juices. Low-sodium or reduced-sodium tomato sauce and paste. Low-sodium or reduced-sodium canned vegetables.  °Fruits °All fresh, canned (in natural juice), or frozen fruits. °Meat and Other Protein Products °Ground beef (85% or leaner), grass-fed beef, or beef trimmed of fat. Skinless chicken or turkey. Ground chicken or turkey. Pork trimmed of fat. All fish and seafood. Eggs. Dried beans, peas, or lentils. Unsalted nuts and seeds. Unsalted canned beans. °Dairy °Low-fat dairy products, such as skim or 1% milk, 2% or reduced-fat cheeses, low-fat ricotta or cottage cheese, or plain low-fat yogurt. Low-sodium or reduced-sodium cheeses. °Fats and Oils °Tub margarines without trans fats. Light or reduced-fat mayonnaise and salad dressings (reduced sodium). Avocado. Safflower, olive, or canola oils. Natural peanut or almond butter. °Other °Unsalted popcorn and pretzels. °The items listed above may not be a complete list of recommended foods or beverages. Contact your dietitian for more options. °WHAT FOODS ARE NOT RECOMMENDED? °Grains °White bread. White pasta. White rice. Refined cornbread. Bagels and croissants. Crackers that contain trans fat. °Vegetables °Creamed or fried vegetables. Vegetables in a cheese sauce. Regular canned vegetables. Regular canned tomato sauce and paste. Regular tomato and vegetable juices. °Fruits °Dried fruits. Canned fruit in light or heavy syrup. Fruit juice. °Meat and Other Protein Products °Fatty cuts of meat. Ribs, chicken wings, bacon, sausage, bologna, salami, chitterlings, fatback, hot dogs, bratwurst, and packaged luncheon meats. Salted nuts and seeds. Canned beans with salt. °Dairy °Whole or 2% milk, cream, half-and-half, and cream cheese. Whole-fat or sweetened yogurt. Full-fat   cheeses or blue cheese. Nondairy creamers and whipped toppings. Processed cheese, cheese spreads, or cheese  curds. °Condiments °Onion and garlic salt, seasoned salt, table salt, and sea salt. Canned and packaged gravies. Worcestershire sauce. Tartar sauce. Barbecue sauce. Teriyaki sauce. Soy sauce, including reduced sodium. Steak sauce. Fish sauce. Oyster sauce. Cocktail sauce. Horseradish. Ketchup and mustard. Meat flavorings and tenderizers. Bouillon cubes. Hot sauce. Tabasco sauce. Marinades. Taco seasonings. Relishes. °Fats and Oils °Butter, stick margarine, lard, shortening, ghee, and bacon fat. Coconut, palm kernel, or palm oils. Regular salad dressings. °Other °Pickles and olives. Salted popcorn and pretzels. °The items listed above may not be a complete list of foods and beverages to avoid. Contact your dietitian for more information. °WHERE CAN I FIND MORE INFORMATION? °National Heart, [employer], and [employer] Institute: www.nhlbi.nih.gov/health/health-topics/topics/dash/ °Document Released: 07/10/2011 Document Revised: 12/05/2013 Document Reviewed: 05/25/2013 °Exit[employer]® Patient Information ©2015 [employer]. This information is not intended to replace advice given to you by your health care provider. Make sure you discuss any questions you have with your health care provider. ° °

## 2014-12-01 NOTE — Assessment & Plan Note (Signed)
I have personally reviewed the Medicare Annual Wellness questionnaire and have noted 1. The patient's medical and social history 2. Their use of alcohol, tobacco or illicit drugs 3. Their current medications and supplements 4. The patient's functional ability including ADL's, fall risks, home safety risks and hearing or visual             impairment. 5. Diet and physical activities 6. Evidence for depression or mood disorders  The patients weight, height, BMI and visual acuity have been recorded in the chart I have made referrals, counseling and provided education to the patient based review of the above and I have provided the pt with a written personalized care plan for preventive services.  I have provided you with a copy of your personalized plan for preventive services. Please take the time to review along with your updated medication list.  He will reconsider the zostavax Colonoscopy due 2019 No PSA due to age Discussed fitness

## 2014-12-01 NOTE — Assessment & Plan Note (Signed)
See social history 

## 2014-12-01 NOTE — Progress Notes (Signed)
Pre visit review using our clinic review tool, if applicable. No additional management support is needed unless otherwise documented below in the visit note. 

## 2014-12-01 NOTE — Progress Notes (Signed)
Subjective:    Patient ID: Matthew Carter, male    DOB: 04/08/1941, 74 y.o.   MRN: [id number]  HPI Here for Medicare wellness and follow up of chronic medical conditions Reviewed form and advanced directives Only sees eye doctor No tobacco or alcohol Tries to exercise regularly No depression or anhedonia Independent with all ADLs No apparent cognitive problems--just forgets people's names Vision and hearing are okay  Foot is better No other joint problems other than some pain at left elbow (in tendon area). He does ice this prn  No problems with BP med Will get slight swelling if he wears tight socks (indentation) No chest pain No SOB No dizziness or syncope No headaches  Tries to be careful with eating Weight is down a few pounds unsweet tea--limits simple sugars  Continues on eye drops Pressures have been controlled--- regular with ophtho  Current Outpatient Prescriptions on File Prior to Visit  Medication Sig Dispense Refill  . amLODipine (NORVASC) 5 MG tablet TAKE 1 TABLET BY MOUTH EVERY DAY FOR BLOOD PRESSURE 90 tablet 0  . latanoprost (XALATAN) 0.005 % ophthalmic solution Place 1 drop into both eyes daily.      No current facility-administered medications on file prior to visit.    No Known Allergies  Past Medical History  Diagnosis Date  . Allergy   . Glucose intolerance (impaired glucose tolerance)   . ED (erectile dysfunction)   . Hypertension   . Adenomatous polyps   . Hemorrhoids   . Glaucoma     Past Surgical History  Procedure Laterality Date  . No prior surgery      Family History  Problem Relation Age of Onset  . GER disease Mother   . Hypertension Mother   . Stroke Father   . Hypertension Father   . Cancer Paternal Grandfather     Leukemia  . Diabetes Paternal Grandfather   . Cancer Brother     throat cancer  . Colon cancer Neg Hx     History   Social History  . Marital Status: Married    Spouse Name: N/A  . Number of  Children: 3  . 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 Smoking status: Former Smoker    Quit date: 08/04/1981  . Smokeless tobacco: Never Used  . Alcohol Use: No  . Drug Use: No  . Sexual Activity: Not on file   Other Topics Concern  . Not on file   Social History Narrative   Has living will    No formal health care POA but requests wife   Would accept resuscitation attempts but no prolonged artificial ventilation   Would accept a feeding tube   Review of Systems Full dentures Sleeps well Appetite is fine Bowels are fine Voids fine. Nocturia x 1-2 and not bothersome Wears seat belt Will get occasional mild back soreness    Objective:   Physical Exam  Constitutional: He is oriented to person, place, and time. He appears well-developed and well-nourished. No distress.  HENT:  Mouth/Throat: Oropharynx is clear and moist. No oropharyngeal exudate.  Neck: Normal range of motion. Neck supple. No thyromegaly present.  Cardiovascular: Normal rate, regular rhythm, normal heart sounds and intact distal pulses.  Exam reveals no gallop.   No murmur heard. Pulmonary/Chest: Effort normal and breath sounds normal. No respiratory distress. He has no wheezes. He has no rales.  Abdominal:  Soft. There is no tenderness.  Small reducible umbilical hernia  Musculoskeletal: He exhibits no edema or tenderness.  Lymphadenopathy:    He has no cervical adenopathy.  Neurological: He is alert and oriented to person, place, and time.  President-- "Obama, Bush, Clinton" [phone] [id number] Recall 2/3  Skin: No rash noted. No erythema.  Psychiatric: He has a normal mood and affect. His behavior is normal.          Assessment & Plan:

## 2014-12-01 NOTE — Assessment & Plan Note (Signed)
BP Readings from Last 3 Encounters:  12/01/14 130/80  07/11/14 138/80  07/06/14 136/74   Good control Defer labs since renal fine a few months ago

## 2015-01-15 DIAGNOSIS — H4011X1 Primary open-angle glaucoma, mild stage: Secondary | ICD-10-CM | POA: Diagnosis not present

## 2015-01-26 ENCOUNTER — Other Ambulatory Visit: Payer: Self-pay | Admitting: Internal Medicine

## 2015-04-05 ENCOUNTER — Telehealth: Payer: Self-pay

## 2015-04-05 NOTE — Telephone Encounter (Signed)
Pt request refill amlodipine to [facility]. Advised pt to contact pharmacy refills were done 01/2015. Pt voiced understanding.

## 2015-04-27 ENCOUNTER — Ambulatory Visit: Payer: Commercial Managed Care - HMO

## 2015-05-02 ENCOUNTER — Ambulatory Visit (INDEPENDENT_AMBULATORY_CARE_PROVIDER_SITE_OTHER): Payer: Commercial Managed Care - HMO

## 2015-05-02 DIAGNOSIS — Z23 Encounter for immunization: Secondary | ICD-10-CM | POA: Diagnosis not present

## 2015-06-01 ENCOUNTER — Telehealth: Payer: Self-pay | Admitting: Internal Medicine

## 2015-06-01 MED ORDER — ZOSTER VACCINE LIVE 19400 UNT/0.65ML ~~LOC~~ SOLR: 0.6500 mL | Freq: Once | SUBCUTANEOUS | Status: DC

## 2015-06-01 NOTE — Telephone Encounter (Signed)
Pt wants a shingles shot, but needs an authorization before ins will pay for it. Please call pt back at [phone] Thank you

## 2015-06-01 NOTE — Telephone Encounter (Signed)
Pt stated insurance will pay better at pharmacy [facility] [address] [place] Please advise pt when this has been called in And pt would like to know the side effects of the vaccine

## 2015-06-01 NOTE — Telephone Encounter (Signed)
Spoke with pt and advise him to check with pharmacy and see where they cover shingles vaccine, either here at the office or as an Rx sent to pharmacy, pt will check which way the insurance covers the vaccine and call us back

## 2015-06-01 NOTE — Telephone Encounter (Signed)
See prev note

## 2015-06-01 NOTE — Telephone Encounter (Signed)
Please let him know it has been sent in  Other than mild vaccine reactions like sore arm, the vaccine is very well tolerated. I haven't seen side effects but the pharmacist should give him a form about it

## 2015-06-04 NOTE — Telephone Encounter (Signed)
.  left message to have patient return my call.  

## 2015-06-05 DIAGNOSIS — H401131 Primary open-angle glaucoma, bilateral, mild stage: Secondary | ICD-10-CM | POA: Diagnosis not present

## 2015-08-05 DIAGNOSIS — C801 Malignant (primary) neoplasm, unspecified: Secondary | ICD-10-CM

## 2015-08-05 HISTORY — DX: Malignant (primary) neoplasm, unspecified: C80.1

## 2015-08-31 ENCOUNTER — Encounter: Payer: Self-pay | Admitting: Internal Medicine

## 2015-08-31 ENCOUNTER — Ambulatory Visit (INDEPENDENT_AMBULATORY_CARE_PROVIDER_SITE_OTHER): Payer: Commercial Managed Care - HMO | Admitting: Internal Medicine

## 2015-08-31 VITALS — BP 140/70 | HR 62 | Temp 98.0°F | Wt 240.0 lb

## 2015-08-31 DIAGNOSIS — R251 Tremor, unspecified: Secondary | ICD-10-CM | POA: Diagnosis not present

## 2015-08-31 DIAGNOSIS — G250 Essential tremor: Secondary | ICD-10-CM | POA: Insufficient documentation

## 2015-08-31 NOTE — Progress Notes (Signed)
   Subjective:    Patient ID: Matthew Carter, male    DOB: 05/10/1941, 75 y.o.   MRN: [id number]  HPI Here due to right arm trouble  When he is trying to eat--- he notices shaking Noticed over 1 year or so--but now worsening Trouble using key, etc Is right handed Hasn't affected other ADLs as much as eating No weakness in hand or arm  Handwriting is about the same No trouble walking No regular stiffness--- except if prolonged sitting like in a car No known family history  Current Outpatient Prescriptions on File Prior to Visit  Medication Sig Dispense Refill  . amLODipine (NORVASC) 5 MG tablet TAKE 1 TABLET EVERY DAY FOR BLOOD PRESSURE 90 tablet 3  . latanoprost (XALATAN) 0.005 % ophthalmic solution Place 1 drop into both eyes daily.     Marland Kitchen loratadine (CLARITIN) 10 MG tablet Take 10 mg by mouth daily as needed for allergies.    Marland Kitchen timolol (TIMOPTIC) 0.5 % ophthalmic solution Place 1 drop into both eyes daily.      No current facility-administered medications on file prior to visit.    No Known Allergies  Past Medical History  Diagnosis Date  . Allergy   . Glucose intolerance (impaired glucose tolerance)   . ED (erectile dysfunction)   . Hypertension   . Adenomatous polyps   . Hemorrhoids   . Glaucoma     Past Surgical History  Procedure Laterality Date  . No prior surgery      Family History  Problem Relation Age of Onset  . GER disease Mother   . Hypertension Mother   . Stroke Father   . Hypertension Father   . Cancer Paternal Grandfather     Leukemia  . Diabetes Paternal Grandfather   . Cancer Brother     throat cancer  . Colon cancer Neg Hx     Social History   Social History  . Marital Status: Married    Spouse Name: N/A  . Number of Children: 3  . 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 Smoking status: Former Smoker    Quit date: 08/04/1981  .  Smokeless tobacco: Never Used  . Alcohol Use: No  . Drug Use: No  . Sexual Activity: Not on file   Other Topics Concern  . Not on file   Social History Narrative   Has living will    No formal health care POA but requests wife   Would accept resuscitation attempts but no prolonged artificial ventilation   Would accept a feeding tube   Review of Systems  No fever Doesn't feel sick     Objective:   Physical Exam  Neurological:  Mild resting tremor and with intention in right hand. Slight intention tremor in left No bradykinesia or stiffness Normal gait Handwriting looks normal          Assessment & Plan:

## 2015-08-31 NOTE — Assessment & Plan Note (Signed)
Despite no FH--this seems more like an essential tremor. Can't rule out very early Parkinson's, I guess No treatment needed in any case Discussed neuro consultation---he would like to proceed

## 2015-08-31 NOTE — Progress Notes (Signed)
Pre visit review using our clinic review tool, if applicable. No additional management support is needed unless otherwise documented below in the visit note. 

## 2015-09-06 ENCOUNTER — Ambulatory Visit (INDEPENDENT_AMBULATORY_CARE_PROVIDER_SITE_OTHER): Payer: Commercial Managed Care - HMO | Admitting: Neurology

## 2015-09-06 ENCOUNTER — Other Ambulatory Visit (INDEPENDENT_AMBULATORY_CARE_PROVIDER_SITE_OTHER): Payer: Commercial Managed Care - HMO

## 2015-09-06 ENCOUNTER — Encounter: Payer: Self-pay | Admitting: Neurology

## 2015-09-06 ENCOUNTER — Telehealth: Payer: Self-pay | Admitting: Neurology

## 2015-09-06 VITALS — BP 128/80 | HR 88 | Ht 71.0 in | Wt 235.0 lb

## 2015-09-06 DIAGNOSIS — R7401 Elevation of levels of liver transaminase levels: Secondary | ICD-10-CM

## 2015-09-06 DIAGNOSIS — R740 Nonspecific elevation of levels of transaminase and lactic acid dehydrogenase [LDH]: Secondary | ICD-10-CM

## 2015-09-06 DIAGNOSIS — R739 Hyperglycemia, unspecified: Secondary | ICD-10-CM | POA: Diagnosis not present

## 2015-09-06 DIAGNOSIS — G250 Essential tremor: Secondary | ICD-10-CM

## 2015-09-06 LAB — COMPREHENSIVE METABOLIC PANEL
ALT: 11 U/L (ref 0–53)
AST: 12 U/L (ref 0–37)
Albumin: 3.6 g/dL (ref 3.5–5.2)
Alkaline Phosphatase: 99 U/L (ref 39–117)
BUN: 14 mg/dL (ref 6–23)
CHLORIDE: 103 meq/L (ref 96–112)
CO2: 32 meq/L (ref 19–32)
CREATININE: 1.28 mg/dL (ref 0.40–1.50)
Calcium: 10 mg/dL (ref 8.4–10.5)
GFR: 70.84 mL/min (ref >60.00)
GLUCOSE: 102 mg/dL — AB (ref 70–99)
POTASSIUM: 4.9 meq/L (ref 3.5–5.1)
SODIUM: 140 meq/L (ref 135–145)
Total Bilirubin: 0.3 mg/dL (ref 0.2–1.2)
Total Protein: 7.4 g/dL (ref 6.0–8.3)

## 2015-09-06 LAB — TSH: TSH: 0.37 u[IU]/mL (ref 0.35–4.50)

## 2015-09-06 LAB — HEMOGLOBIN A1C: HEMOGLOBIN A1C: 6.3 % (ref 4.6–6.5)

## 2015-09-06 MED ORDER — PRIMIDONE 50 MG PO TABS: 50.0000 mg | ORAL_TABLET | Freq: Every day | ORAL | Status: DC

## 2015-09-06 NOTE — Progress Notes (Signed)
Matthew Carter was seen today in the movement disorders clinic for neurologic consultation at the request of Viviana Simpler, MD.  The consultation is for the evaluation of tremor in the right hand x 1 year.  Tremor is mostly noted with use of the hand.  He is right hand dominant.  He states that it doesn't bother him but he wants to know what it is.  He is "chasing around his food."  No tremor at rest.  No fam hx of tremor.  No tremor on the left.  Has trouble locking a door because of tremor.  Specific Symptoms:  Tremor: Yes.     Affected by caffeine:  No. (drinks 1 cup coffee/day and few cups tea per day)  Affected by alcohol: unknown  Affected by stress:  No., "I don't worry"  Affected by fatigue:  No.  Spills soup if on spoon:  No.  Spills glass of liquid if full:  Yes.    Affects ADL's (tying shoes, brushing teeth, etc):  No.   Voice: no change Sleep: sleeps well  Vivid Dreams:  No.  Acting out dreams:  No. Wet Pillows: No. Postural symptoms:  No.  Falls?  No. Bradykinesia symptoms: difficulty getting out of a chair Loss of smell:  No. Loss of taste:  No. Urinary Incontinence:  No. Difficulty Swallowing:  No. Handwriting, micrographia: No. Trouble with ADL's:  No.  Trouble buttoning clothing: No. Depression:  No. Memory changes:  No. Hallucinations:  No.  visual distortions: No. N/V:  No. Lightheaded:  No.  Syncope: No. Diplopia:  No. Dyskinesia:  No.  Neuroimaging has not previously been performed.     ALLERGIES:  No Known Allergies  CURRENT MEDICATIONS:  Outpatient Encounter Prescriptions as of 09/06/2015  Medication Sig  . amLODipine (NORVASC) 5 MG tablet TAKE 1 TABLET EVERY DAY FOR BLOOD PRESSURE  . aspirin 81 MG tablet Take 81 mg by mouth daily.  Marland Kitchen latanoprost (XALATAN) 0.005 % ophthalmic solution Place 1 drop into both eyes daily.   Marland Kitchen loratadine (CLARITIN) 10 MG tablet Take 10 mg by mouth daily as needed for allergies.  Marland Kitchen timolol (TIMOPTIC) 0.5 %  ophthalmic solution Place 1 drop into both eyes daily.    No facility-administered encounter medications on file as of 09/06/2015.    PAST MEDICAL HISTORY:   Past Medical History  Diagnosis Date  . Allergy   . Glucose intolerance (impaired glucose tolerance)   . ED (erectile dysfunction)   . Hypertension   . Adenomatous polyps   . Hemorrhoids   . Glaucoma     PAST SURGICAL HISTORY:   Past Surgical History  Procedure Laterality Date  . No past surgeries      SOCIAL HISTORY:   Social History   Social History  . Marital Status: Married    Spouse Name: N/A  . Number of Children: 3  . 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 Smoking status: Former Smoker    Quit date: 08/04/1981  . Smokeless tobacco: Never Used  . Alcohol Use: No  . Drug Use: No  . Sexual Activity: Not on file   Other Topics Concern  . Not on file   Social History Narrative   Has living will    No formal health care POA but requests wife   Would accept resuscitation attempts but no prolonged artificial ventilation   Would accept a feeding  tube    FAMILY HISTORY:   Family Status  Relation Status Death Age  . Mother Deceased     renal failure, HTN  . Father Deceased     stroke  . Paternal Grandfather Deceased   . Brother Alive     dementia, throat cancer, [place] vet  . Sister Alive     unknown medical health  . Child Alive     3, healthy    ROS:  A complete 10 system review of systems was obtained and was unremarkable apart from what is mentioned above.  PHYSICAL EXAMINATION:    VITALS:   Filed Vitals:   09/06/15 0807  BP: 128/80  Pulse: 88  Height: 5\' 11"  (1.803 m)  Weight: 235 lb (106.595 kg)    GEN:  The patient appears stated age and is in NAD. HEENT:  Normocephalic, atraumatic.  The mucous membranes are moist. The superficial temporal arteries are without ropiness or tenderness. CV:   RRR Lungs:  CTAB Neck/HEME:  There are no carotid bruits bilaterally.  Neurological examination:  Orientation: The patient is alert and oriented x3. Fund of knowledge is appropriate.  Recent and remote memory are intact.  Attention and concentration are normal.    Able to name objects and repeat phrases. Cranial nerves: There is good facial symmetry. Pupils are equal round and reactive to light bilaterally. Fundoscopic exam is attempted but the disc margins are not well visualized bilaterally. Extraocular muscles are intact. The visual fields are full to confrontational testing. The speech is fluent and clear. Soft palate rises symmetrically and there is no tongue deviation. Hearing is intact to conversational tone. Sensation: Sensation is intact to light and pinprick throughout (facial, trunk, extremities). Vibration is intact at the bilateral big toe but it is decreased . There is no extinction with double simultaneous stimulation. There is no sensory dermatomal level identified. Motor: Strength is 5/5 in the bilateral upper and lower extremities.   Shoulder shrug is equal and symmetric.  There is no pronator drift. Deep tendon reflexes: Deep tendon reflexes are 1/4 at the bilateral biceps, triceps, brachioradialis, patella and absent achilles. Plantar responses are downgoing bilaterally.  Movement examination: Tone: There is normal tone in the bilateral upper extremities.  The tone in the lower extremities is normal.  Abnormal movements: There is no rest tremor.  He does have intention tremor, more on the right, but it is present on the left but it is much more mild.  When he is asked to pour a full glass of water from one glass to another, tremor is evident when the water is in both hands, but he spills very little. Coordination:  There is no decremation with RAM's, with any form of RAMS, including alternating supination and pronation of the forearm, hand opening and closing, finger taps, heel  taps and toe taps. Gait and Station: The patient has no difficulty arising out of a deep-seated chair without the use of the hands. The patient's stride length is normal.  He does have mild difficulty ambulating in a tandem fashion.  He is able to heel toe walk.  LABS  Lab Results  Component Value Date   TSH 0.35 11/29/2013     Chemistry      Component Value Date/Time   NA 139 07/06/2014 0840   NA 141 02/26/2014 0246   K 4.2 07/06/2014 0840   K 4.3 02/26/2014 0246   CL 103 07/06/2014 0840   CL 104 02/26/2014 0246   CO2 29  07/06/2014 0840   CO2 32 02/26/2014 0246   BUN 16 07/06/2014 0840   BUN 12 02/26/2014 0246   CREATININE 1.2 07/06/2014 0840   CREATININE 1.19 02/26/2014 0246      Component Value Date/Time   CALCIUM 9.3 07/06/2014 0840   CALCIUM 9.2 02/26/2014 0246   ALKPHOS 115 02/26/2014 0246   ALKPHOS 87 11/29/2013 0936   AST 148* 02/26/2014 0246   AST 21 11/29/2013 0936   ALT 86* 02/26/2014 0246   ALT 20 11/29/2013 0936   BILITOT 0.5 02/26/2014 0246   BILITOT 0.4 11/29/2013 0936      Lab Results  Component Value Date   HGBA1C 6.2 05/24/2010      ASSESSMENT/PLAN:  1.  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  It is not uncommon for essential tremor to be asymmetric.  He does have tremor in the left hand, even though it is much more prominent in the right.  Regardless, it is fairly mild and it does not particularly bother the patient.  I will do lab work to make sure that we are not missing an underlying etiology.  He will have a chemistry, TSH, hemoglobin A1c (history of hyperglycemia).  -We talked about medications, but in the end opted to hold on that.  -Patient literature regarding essential tremor was provided.  -I do not see any evidence of a neurodegenerative process like Parkinson's disease.  -We will call the patient with the results of his lab work.  He will let me know if he decides that he wants medication in the future and we would be  happy to discuss that.  For now, he would like to follow-up on a when necessary basis.  Much greater than 50% of the 45 minute visit spent in counseling.

## 2015-09-06 NOTE — Patient Instructions (Signed)
Your provider has requested that you have labwork completed today. Please go to [facility] ([id number]) on the second floor of this building before leaving the office today. You do not need to check in. If you are not called within 15 minutes please check with the front desk.   

## 2015-09-06 NOTE — Telephone Encounter (Signed)
-----   Message from [place], DO sent at 09/06/2015  1:04 PM EST ----- Let pt know that labs okay.  On borderline with DM, but has been for a while and looks stable.  Watch diet.

## 2015-09-06 NOTE — Telephone Encounter (Signed)
Interesting.  That is not what I heard today.  Perhaps he changed his mind.  Start primidone - 50 mg - per my protocol at bedtime.  Make f/u appt in 2-3 months

## 2015-09-06 NOTE — Telephone Encounter (Signed)
Patient made aware. RX sent to the pharmacy. Made aware first dose can cause dizziness and nausea. He will let us know of any side effects. Follow up appt scheduled.

## 2015-09-06 NOTE — Telephone Encounter (Signed)
Patient called back and made aware labs okay. He is interested in starting medication for tremor. Please advise on medication and dosage.

## 2015-09-06 NOTE — Telephone Encounter (Signed)
Left message on machine for patient to call back.

## 2015-11-21 ENCOUNTER — Ambulatory Visit: Payer: Self-pay | Admitting: Neurology

## 2015-11-21 DIAGNOSIS — Z029 Encounter for administrative examinations, unspecified: Secondary | ICD-10-CM

## 2015-12-03 DIAGNOSIS — H401131 Primary open-angle glaucoma, bilateral, mild stage: Secondary | ICD-10-CM | POA: Diagnosis not present

## 2015-12-05 ENCOUNTER — Encounter: Payer: Self-pay | Admitting: Internal Medicine

## 2015-12-05 ENCOUNTER — Ambulatory Visit (INDEPENDENT_AMBULATORY_CARE_PROVIDER_SITE_OTHER): Payer: Commercial Managed Care - HMO | Admitting: Internal Medicine

## 2015-12-05 VITALS — BP 130/80 | HR 79 | Temp 98.0°F | Ht 71.0 in | Wt 235.0 lb

## 2015-12-05 DIAGNOSIS — G250 Essential tremor: Secondary | ICD-10-CM | POA: Diagnosis not present

## 2015-12-05 DIAGNOSIS — Z7189 Other specified counseling: Secondary | ICD-10-CM | POA: Diagnosis not present

## 2015-12-05 DIAGNOSIS — I10 Essential (primary) hypertension: Secondary | ICD-10-CM

## 2015-12-05 DIAGNOSIS — Z0000 Encounter for general adult medical examination without abnormal findings: Secondary | ICD-10-CM | POA: Diagnosis not present

## 2015-12-05 NOTE — Assessment & Plan Note (Signed)
I have personally reviewed the Medicare Annual Wellness questionnaire and have noted 1. The patient's medical and social history 2. Their use of alcohol, tobacco or illicit drugs 3. Their current medications and supplements 4. The patient's functional ability including ADL's, fall risks, home safety risks and hearing or visual             impairment. 5. Diet and physical activities 6. Evidence for depression or mood disorders  The patients weight, height, BMI and visual acuity have been recorded in the chart I have made referrals, counseling and provided education to the patient based review of the above and I have provided the pt with a written personalized care plan for preventive services.  I have provided you with a copy of your personalized plan for preventive services. Please take the time to review along with your updated medication list.  No PSA due to age Colon due 2019 Discussed fitness He will consider the zostavax Yearly flu vaccine

## 2015-12-05 NOTE — Assessment & Plan Note (Signed)
Mild  No Rx needed as yet

## 2015-12-05 NOTE — Assessment & Plan Note (Signed)
See social history Blank forms given 

## 2015-12-05 NOTE — Patient Instructions (Signed)
DASH Eating Plan  DASH stands for "Dietary Approaches to Stop Hypertension." The DASH eating plan is a healthy eating plan that has been shown to reduce high blood pressure (hypertension). Additional health benefits may include reducing the risk of type 2 diabetes mellitus, heart disease, and stroke. The DASH eating plan may also help with weight loss.  WHAT DO I NEED TO KNOW ABOUT THE DASH EATING PLAN?  For the DASH eating plan, you will follow these general guidelines:  · Choose foods with a percent daily value for sodium of less than 5% (as listed on the food label).  · Use salt-free seasonings or herbs instead of table salt or sea salt.  · Check with your health care provider or pharmacist before using salt substitutes.  · Eat lower-sodium products, often labeled as "lower sodium" or "no salt added."  · Eat fresh foods.  · Eat more vegetables, fruits, and low-fat dairy products.  · Choose whole grains. Look for the word "whole" as the first word in the ingredient list.  · Choose fish and skinless chicken or turkey more often than red meat. Limit fish, poultry, and meat to 6 oz (170 g) each day.  · Limit sweets, desserts, sugars, and sugary drinks.  · Choose heart-healthy fats.  · Limit cheese to 1 oz (28 g) per day.  · Eat more home-cooked food and less restaurant, buffet, and fast food.  · Limit fried foods.  · Cook foods using methods other than frying.  · Limit canned vegetables. If you do use them, rinse them well to decrease the sodium.  · When eating at a restaurant, ask that your food be prepared with less salt, or no salt if possible.  WHAT FOODS CAN I EAT?  Seek help from a dietitian for individual calorie needs.  Grains  Whole grain or whole wheat bread. Brown rice. Whole grain or whole wheat pasta. Quinoa, bulgur, and whole grain cereals. Low-sodium cereals. Corn or whole wheat flour tortillas. Whole grain cornbread. Whole grain crackers. Low-sodium crackers.  Vegetables  Fresh or frozen vegetables  (raw, steamed, roasted, or grilled). Low-sodium or reduced-sodium tomato and vegetable juices. Low-sodium or reduced-sodium tomato sauce and paste. Low-sodium or reduced-sodium canned vegetables.   Fruits  All fresh, canned (in natural juice), or frozen fruits.  Meat and Other Protein Products  Ground beef (85% or leaner), grass-fed beef, or beef trimmed of fat. Skinless chicken or turkey. Ground chicken or turkey. Pork trimmed of fat. All fish and seafood. Eggs. Dried beans, peas, or lentils. Unsalted nuts and seeds. Unsalted canned beans.  Dairy  Low-fat dairy products, such as skim or 1% milk, 2% or reduced-fat cheeses, low-fat ricotta or cottage cheese, or plain low-fat yogurt. Low-sodium or reduced-sodium cheeses.  Fats and Oils  Tub margarines without trans fats. Light or reduced-fat mayonnaise and salad dressings (reduced sodium). Avocado. Safflower, olive, or canola oils. Natural peanut or almond butter.  Other  Unsalted popcorn and pretzels.  The items listed above may not be a complete list of recommended foods or beverages. Contact your dietitian for more options.  WHAT FOODS ARE NOT RECOMMENDED?  Grains  White bread. White pasta. White rice. Refined cornbread. Bagels and croissants. Crackers that contain trans fat.  Vegetables  Creamed or fried vegetables. Vegetables in a cheese sauce. Regular canned vegetables. Regular canned tomato sauce and paste. Regular tomato and vegetable juices.  Fruits  Dried fruits. Canned fruit in light or heavy syrup. Fruit juice.  Meat and Other Protein   Products  Fatty cuts of meat. Ribs, chicken wings, bacon, sausage, bologna, salami, chitterlings, fatback, hot dogs, bratwurst, and packaged luncheon meats. Salted nuts and seeds. Canned beans with salt.  Dairy  Whole or 2% milk, cream, half-and-half, and cream cheese. Whole-fat or sweetened yogurt. Full-fat cheeses or blue cheese. Nondairy creamers and whipped toppings. Processed cheese, cheese spreads, or cheese  curds.  Condiments  Onion and garlic salt, seasoned salt, table salt, and sea salt. Canned and packaged gravies. Worcestershire sauce. Tartar sauce. Barbecue sauce. Teriyaki sauce. Soy sauce, including reduced sodium. Steak sauce. Fish sauce. Oyster sauce. Cocktail sauce. Horseradish. Ketchup and mustard. Meat flavorings and tenderizers. Bouillon cubes. Hot sauce. Tabasco sauce. Marinades. Taco seasonings. Relishes.  Fats and Oils  Butter, stick margarine, lard, shortening, ghee, and bacon fat. Coconut, palm kernel, or palm oils. Regular salad dressings.  Other  Pickles and olives. Salted popcorn and pretzels.  The items listed above may not be a complete list of foods and beverages to avoid. Contact your dietitian for more information.  WHERE CAN I FIND MORE INFORMATION?  National Heart, Lung, and Blood Institute: www.nhlbi.nih.gov/health/health-topics/topics/dash/     This information is not intended to replace advice given to you by your health care provider. Make sure you discuss any questions you have with your health care provider.     Document Released: 07/10/2011 Document Revised: 08/11/2014 Document Reviewed: 05/25/2013  Elsevier Interactive Patient Education ©2016 [employer]

## 2015-12-05 NOTE — Assessment & Plan Note (Signed)
BP Readings from Last 3 Encounters:  12/05/15 130/80  09/06/15 128/80  08/31/15 140/70   Good control No changes needed

## 2015-12-05 NOTE — Progress Notes (Signed)
Subjective:    Patient ID: Matthew Carter, male    DOB: 02-13-1941, 75 y.o.   MRN: [id number]  HPI Here for Medicare wellness and follow up of chronic medical conditions Reviewed form and advanced directives Reviewed other doctors-- just eye doctor Very mild hearing issues--mostly according to  wife No alcohol or tobacco Does walk once a week--discussed increasing frequency. Does yard work also No falls No depression or anhedonia Independent with instrumental ADLs Mild memory issues--nothing worrisome  Reviewed Dr Doristine Devoid consultation Decided against taking the primidone Tremor is still mild--so he feels okay without treatment  BP is controlled No chest pain No SOB No dizziness or syncope No edema  Glaucoma is controlled with drops Vision is fine   Current Outpatient Prescriptions on File Prior to Visit  Medication Sig Dispense Refill  . amLODipine (NORVASC) 5 MG tablet TAKE 1 TABLET EVERY DAY FOR BLOOD PRESSURE 90 tablet 3  . aspirin 81 MG tablet Take 81 mg by mouth daily.    Marland Kitchen latanoprost (XALATAN) 0.005 % ophthalmic solution Place 1 drop into both eyes daily.     Marland Kitchen loratadine (CLARITIN) 10 MG tablet Take 10 mg by mouth daily as needed for allergies.    Marland Kitchen timolol (TIMOPTIC) 0.5 % ophthalmic solution Place 1 drop into both eyes daily.      No current facility-administered medications on file prior to visit.    No Known Allergies  Past Medical History  Diagnosis Date  . Allergy   . Glucose intolerance (impaired glucose tolerance)   . ED (erectile dysfunction)   . Hypertension   . Adenomatous polyps   . Hemorrhoids   . Glaucoma     Past Surgical History  Procedure Laterality Date  . No past surgeries      Family History  Problem Relation Age of Onset  . GER disease Mother   . Hypertension Mother   . Stroke Father   . Hypertension Father   . Cancer Paternal Grandfather     Leukemia  . Diabetes Paternal Grandfather   . Cancer Brother     throat cancer   . Colon cancer Neg Hx     Social History   Social History  . Marital Status: Married    Spouse Name: N/A  . Number of Children: 3  . 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 Smoking status: Former Smoker    Quit date: 08/04/1981  . Smokeless tobacco: Never Used  . Alcohol Use: No  . Drug Use: No  . Sexual Activity: Not on file   Other Topics Concern  . Not on file   Social History Narrative   Has living will    No formal health care POA but requests wife   Would accept resuscitation attempts but no prolonged artificial ventilation   Would accept a feeding tube   Review of Systems He discussed the zostavax with pharmacist--- he is still undecided Satisfied with loratadine for allergies Sleeps reasonably well-- 5-6 hours. No daytime somnolence. Full dentures Part time at [place] work Appetite is fine Weight stable Wears seat belt Had some constipation--better with increased fruit and daily oatmeal. No blood. Voids okay. No sig back or joint pain---usual aches and stiffness No rash or suspicious lesions     Objective:   Physical Exam  Constitutional: He is oriented to person, place, and time. He appears well-developed and well-nourished. No distress.  HENT:  Mouth/Throat: Oropharynx is clear and moist. No oropharyngeal exudate.  Neck: Normal range of motion. Neck supple. No thyromegaly present.  Cardiovascular: Normal rate, regular rhythm, normal heart sounds and intact distal pulses.  Exam reveals no gallop.   No murmur heard. Pulmonary/Chest: Effort normal and breath sounds normal. No respiratory distress. He has no wheezes. He has no rales.  Abdominal: Soft. There is no tenderness.  Musculoskeletal: He exhibits no edema or tenderness.  Lymphadenopathy:    He has no cervical adenopathy.  Neurological: He is alert and oriented to person, place, and time.    President-- "Dwaine Deter, Bush" [phone] [id number] Recall 3/3  Skin: No rash noted. No erythema.  Psychiatric: He has a normal mood and affect. His behavior is normal.          Assessment & Plan:

## 2016-01-05 ENCOUNTER — Other Ambulatory Visit: Payer: Self-pay | Admitting: Internal Medicine

## 2016-04-04 ENCOUNTER — Ambulatory Visit: Payer: Self-pay

## 2016-04-09 ENCOUNTER — Ambulatory Visit (INDEPENDENT_AMBULATORY_CARE_PROVIDER_SITE_OTHER): Payer: Commercial Managed Care - HMO

## 2016-04-09 DIAGNOSIS — Z23 Encounter for immunization: Secondary | ICD-10-CM | POA: Diagnosis not present

## 2016-06-06 ENCOUNTER — Ambulatory Visit (INDEPENDENT_AMBULATORY_CARE_PROVIDER_SITE_OTHER): Payer: Commercial Managed Care - HMO | Admitting: Primary Care

## 2016-06-06 ENCOUNTER — Encounter: Payer: Self-pay | Admitting: Primary Care

## 2016-06-06 VITALS — BP 140/78 | HR 70 | Temp 98.3°F | Ht 71.0 in | Wt 236.8 lb

## 2016-06-06 DIAGNOSIS — R229 Localized swelling, mass and lump, unspecified: Secondary | ICD-10-CM | POA: Diagnosis not present

## 2016-06-06 NOTE — Patient Instructions (Addendum)
Apply warm compresses three times daily for 30 minute intervals for the next several days.   Please call me if you develop fevers, increased pain, notice enlarged side, increased redness.   It was a pleasure meeting you!

## 2016-06-06 NOTE — Progress Notes (Signed)
Pre visit review using our clinic review tool, if applicable. No additional management support is needed unless otherwise documented below in the visit note. 

## 2016-06-06 NOTE — Progress Notes (Signed)
Subjective:    Patient ID: Matthew Carter, male    DOB: 1940-12-18, 75 y.o.   MRN: [id number]  HPI  Matthew Carter is a 75 year old male with a history of boils who presents today with a chief complaint of skin mass This mass is located to the mid thoracic back which he noticed 3-4 days ago. He has intermittent pain, and woke up this morning and noticed a reduction in his pain. He denies fevers, weakness, nausea. He's not taken anything OTC for his symptoms.   Review of Systems  Constitutional: Negative for chills, fatigue and fever.  Skin: Positive for color change and wound.       Past Medical History:  Diagnosis Date  . Adenomatous polyps   . Allergy   . ED (erectile dysfunction)   . Glaucoma   . Glucose intolerance (impaired glucose tolerance)   . Hemorrhoids   . Hypertension      Social History   Social History  . Marital status: Married    Spouse name: N/A  . Number of children: 3  . Years of education: N/A   Occupational History  . Retired Control and instrumentation engineer   Social History Main Topics  . Smoking status: Former Smoker    Quit date: 08/04/1981  . Smokeless tobacco: Never Used  . Alcohol use No  . Drug use: No  . Sexual activity: Not on file   Other Topics Concern  . Not on file   Social History Narrative   Has living will    No formal health care POA but requests wife   Would accept resuscitation attempts but no prolonged artificial ventilation   Would accept a feeding tube    Past Surgical History:  Procedure Laterality Date  . NO PAST SURGERIES      Family History  Problem Relation Age of Onset  . GER disease Mother   . Hypertension Mother   . Stroke Father   . Hypertension Father   . Cancer Paternal Grandfather     Leukemia  . Diabetes Paternal Grandfather   . Cancer Brother     throat cancer  . Colon cancer Neg Hx     No Known Allergies  Current Outpatient Prescriptions on File Prior to Visit  Medication Sig  Dispense Refill  . amLODipine (NORVASC) 5 MG tablet TAKE 1 TABLET EVERY DAY FOR BLOOD PRESSURE 90 tablet 3  . aspirin 81 MG tablet Take 81 mg by mouth daily.    Marland Kitchen latanoprost (XALATAN) 0.005 % ophthalmic solution Place 1 drop into both eyes daily.     Marland Kitchen loratadine (CLARITIN) 10 MG tablet Take 10 mg by mouth daily as needed for allergies.    Marland Kitchen timolol (TIMOPTIC) 0.5 % ophthalmic solution Place 1 drop into both eyes daily.      No current facility-administered medications on file prior to visit.     BP 140/78   Pulse 70   Temp 98.3 F (36.8 C) (Oral)   Ht 5\' 11"  (1.803 m)   Wt 236 lb 12.8 oz (107.4 kg)   SpO2 97%   BMI 33.03 kg/m    Objective:   Physical Exam  Constitutional: He appears well-nourished.  Cardiovascular: Normal rate and regular rhythm.   Pulmonary/Chest: Effort normal and breath sounds normal.  Skin: Skin is warm and dry.  Less than 2 cm circular irritated black head to area of concern (mid thoracic back). No surrounding erythema. Non tender.  Assessment & Plan:  Black Head:  Skin mass/irritation to mid thoracic back x 3 days. Exam today with numerous black heads in surrounding area. Area of concern is irritated black head, not abscess or cellulitis. No s/s of acute infection. Discussed warm compresses, aleve PRN. He will notify if he develops fevers, increased pain, swelling.  Sheral Flow, NP

## 2016-07-04 DIAGNOSIS — H401131 Primary open-angle glaucoma, bilateral, mild stage: Secondary | ICD-10-CM | POA: Diagnosis not present

## 2016-07-10 ENCOUNTER — Ambulatory Visit (INDEPENDENT_AMBULATORY_CARE_PROVIDER_SITE_OTHER): Payer: Commercial Managed Care - HMO | Admitting: Family Medicine

## 2016-07-10 ENCOUNTER — Encounter: Payer: Self-pay | Admitting: Family Medicine

## 2016-07-10 VITALS — BP 144/72 | HR 74 | Temp 98.3°F | Wt 235.5 lb

## 2016-07-10 DIAGNOSIS — R319 Hematuria, unspecified: Secondary | ICD-10-CM

## 2016-07-10 DIAGNOSIS — N3090 Cystitis, unspecified without hematuria: Secondary | ICD-10-CM

## 2016-07-10 LAB — POC URINALSYSI DIPSTICK (AUTOMATED)
BILIRUBIN UA: NEGATIVE
Glucose, UA: NEGATIVE
Ketones, UA: NEGATIVE
NITRITE UA: NEGATIVE
PH UA: 6
PROTEIN UA: 15
Spec Grav, UA: 1.02
UROBILINOGEN UA: 0.2

## 2016-07-10 MED ORDER — SULFAMETHOXAZOLE-TRIMETHOPRIM 800-160 MG PO TABS: 1.0000 | ORAL_TABLET | Freq: Two times a day (BID) | ORAL | 0 refills | Status: DC

## 2016-07-10 NOTE — Progress Notes (Signed)
Subjective:    Patient ID: Matthew Carter, male    DOB: 1940/11/07, 75 y.o.   MRN: [id number]  HPI This is a 75 yo male who presents today with hematuria, started yesterday. Noticed some redness on end of penis when showering yesterday, less red today, no discharge. No dysuria, no frequency, no abdominal pain or nausea or vomiting. No back pain. No fever. Not sexually active.  Otherwise feels fine. Had similar symptoms in the past that cleared spontaneously.   Past Medical History:  Diagnosis Date  . Adenomatous polyps   . Allergy   . ED (erectile dysfunction)   . Glaucoma   . Glucose intolerance (impaired glucose tolerance)   . Hemorrhoids   . Hypertension    Past Surgical History:  Procedure Laterality Date  . NO PAST SURGERIES     Family History  Problem Relation Age of Onset  . GER disease Mother   . Hypertension Mother   . Stroke Father   . Hypertension Father   . Cancer Paternal Grandfather     Leukemia  . Diabetes Paternal Grandfather   . Cancer Brother     throat cancer  . Colon cancer Neg Hx    Social History  Substance Use Topics  . Smoking status: Former Smoker    Quit date: 08/04/1981  . Smokeless tobacco: Never Used  . Alcohol use No      Review of Systems Per HPI    Objective:   Physical Exam  Constitutional: He is oriented to person, place, and time. He appears well-developed and well-nourished. No distress.  HENT:  Head: Normocephalic and atraumatic.  Cardiovascular: Normal rate.   Pulmonary/Chest: Effort normal.  Genitourinary: Circumcised. Penile erythema present.  Genitourinary Comments: Slight erythema around glans. No discharge.   Musculoskeletal: He exhibits no edema.  Neurological: He is alert and oriented to person, place, and time.  Skin: Skin is warm and dry. He is not diaphoretic.  Psychiatric: He has a normal mood and affect. His behavior is normal. Judgment and thought content normal.  Vitals reviewed.     BP (!) 144/72  (BP Location: Right Arm, Patient Position: Sitting, Cuff Size: Normal)   Pulse 74   Temp 98.3 F (36.8 C) (Oral)   Wt 235 lb 8 oz (106.8 kg)   SpO2 97%   BMI 32.85 kg/m  Results for orders placed or performed in visit on 07/10/16  POCT Urinalysis Dipstick (Automated)  Result Value Ref Range   Color, UA Dark Red    Clarity, UA Cloudy    Glucose, UA negative    Bilirubin, UA negative    Ketones, UA negative    Spec Grav, UA 1.020    Blood, UA 3+    pH, UA 6.0    Protein, UA 15    Urobilinogen, UA 0.2    Nitrite, UA negative    Leukocytes, UA large (3+) (A) Negative       Assessment & Plan:  1. Hematuria, unspecified type - POCT Urinalysis Dipstick (Automated) - Urinalysis, Routine w reflex microscopic; Future  2. Cystitis - Provided written and verbal information regarding diagnosis and treatment. - sulfamethoxazole-trimethoprim (BACTRIM DS,SEPTRA DS) 800-160 MG tablet; Take 1 tablet by mouth 2 (two) times daily.  Dispense: 6 tablet; Refill: 0 - Urine culture - Urinalysis, Routine w reflex microscopic; Future - Will recheck urine at lab only visit in 4-6 weeks - RTC precautions reviewed - localized irritation likely related and is improved today, will continue to monitor  for improvement  Clarene Reamer, FNP-BC  [facility]: [facility], [facility]  07/10/2016 10:28 AM

## 2016-07-10 NOTE — Patient Instructions (Signed)
We will notify you of culture results  Please make a lab visit appointment to come back in 4-6 weeks for a urine recheck If you develop fever, vomiting or severe back or abdominal pain, please come back in to see [place] or go the ER   Urinary Tract Infection, Adult A urinary tract infection (UTI) is an infection of any part of the urinary tract, which includes the kidneys, ureters, bladder, and urethra. These organs make, store, and get rid of urine in the body. UTI can be a bladder infection (cystitis) or kidney infection (pyelonephritis). What are the causes? This infection may be caused by fungi, viruses, or bacteria. Bacteria are the most common cause of UTIs. This condition can also be caused by repeated incomplete emptying of the bladder during urination. What increases the risk? This condition is more likely to develop if:  You ignore your need to urinate or hold urine for long periods of time.  You do not empty your bladder completely during urination.  You wipe back to front after urinating or having a bowel movement, if you are male.  You are uncircumcised, if you are male.  You are constipated.  You have a urinary catheter that stays in place (indwelling).  You have a weak defense (immune) system.  You have a medical condition that affects your bowels, kidneys, or bladder.  You have diabetes.  You take antibiotic medicines frequently or for long periods of time, and the antibiotics no longer work well against certain types of infections (antibiotic resistance).  You take medicines that irritate your urinary tract.  You are exposed to chemicals that irritate your urinary tract.  You are male. What are the signs or symptoms? Symptoms of this condition include:  Fever.  Frequent urination or passing small amounts of urine frequently.  Needing to urinate urgently.  Pain or burning with urination.  Urine that smells bad or unusual.  Cloudy urine.  Pain in the  lower abdomen or back.  Trouble urinating.  Blood in the urine.  Vomiting or being less hungry than normal.  Diarrhea or abdominal pain.  Vaginal discharge, if you are male. How is this diagnosed? This condition is diagnosed with a medical history and physical exam. You will also need to provide a urine sample to test your urine. Other tests may be done, including:  Blood tests.  Sexually transmitted disease (STD) testing. If you have had more than one UTI, a cystoscopy or imaging studies may be done to determine the cause of the infections. How is this treated? Treatment for this condition often includes a combination of two or more of the following:  Antibiotic medicine.  Other medicines to treat less common causes of UTI.  Over-the-counter medicines to treat pain.  Drinking enough water to stay hydrated. Follow these instructions at home:  Take over-the-counter and prescription medicines only as told by your health care provider.  If you were prescribed an antibiotic, take it as told by your health care provider. Do not stop taking the antibiotic even if you start to feel better.  Avoid alcohol, caffeine, tea, and carbonated beverages. They can irritate your bladder.  Drink enough fluid to keep your urine clear or pale yellow.  Keep all follow-up visits as told by your health care provider. This is important.  Make sure to:  Empty your bladder often and completely. Do not hold urine for long periods of time.  Empty your bladder before and after sex.  Wipe from front to  back after a bowel movement if you are male. Use each tissue one time when you wipe. Contact a health care provider if:  You have back pain.  You have a fever.  You feel nauseous or vomit.  Your symptoms do not get better after 3 days.  Your symptoms go away and then return. Get help right away if:  You have severe back pain or lower abdominal pain.  You are vomiting and cannot keep  down any medicines or water. This information is not intended to replace advice given to you by your health care provider. Make sure you discuss any questions you have with your health care provider. Document Released: 04/30/2005 Document Revised: 01/02/2016 Document Reviewed: 06/11/2015 Elsevier Interactive Patient Education  2017 [employer].

## 2016-07-10 NOTE — Progress Notes (Signed)
Pre visit review using our clinic review tool, if applicable. No additional management support is needed unless otherwise documented below in the visit note. 

## 2016-07-11 LAB — URINE CULTURE: Organism ID, Bacteria: NO GROWTH

## 2016-07-16 ENCOUNTER — Telehealth: Payer: Self-pay | Admitting: Family Medicine

## 2016-07-16 NOTE — Telephone Encounter (Signed)
Lab message sent to clinical staff to call patient regarding results.

## 2016-07-16 NOTE — Telephone Encounter (Signed)
Patient called to get the results of his urine culture.

## 2016-08-07 ENCOUNTER — Other Ambulatory Visit: Payer: Commercial Managed Care - HMO

## 2016-08-11 ENCOUNTER — Other Ambulatory Visit (INDEPENDENT_AMBULATORY_CARE_PROVIDER_SITE_OTHER): Payer: Commercial Managed Care - HMO

## 2016-08-11 DIAGNOSIS — N3090 Cystitis, unspecified without hematuria: Secondary | ICD-10-CM

## 2016-08-11 DIAGNOSIS — R319 Hematuria, unspecified: Secondary | ICD-10-CM | POA: Diagnosis not present

## 2016-08-11 LAB — URINALYSIS, ROUTINE W REFLEX MICROSCOPIC
BILIRUBIN URINE: NEGATIVE
KETONES UR: NEGATIVE
NITRITE: NEGATIVE
Specific Gravity, Urine: 1.015 (ref 1.000–1.030)
TOTAL PROTEIN, URINE-UPE24: NEGATIVE
URINE GLUCOSE: NEGATIVE
Urobilinogen, UA: 0.2 (ref 0.0–1.0)
pH: 6 (ref 5.0–8.0)

## 2016-08-19 ENCOUNTER — Ambulatory Visit (INDEPENDENT_AMBULATORY_CARE_PROVIDER_SITE_OTHER): Payer: Commercial Managed Care - HMO | Admitting: Family Medicine

## 2016-08-19 ENCOUNTER — Encounter: Payer: Self-pay | Admitting: Family Medicine

## 2016-08-19 VITALS — BP 124/66 | HR 91 | Temp 98.9°F | Wt 237.8 lb

## 2016-08-19 DIAGNOSIS — R197 Diarrhea, unspecified: Secondary | ICD-10-CM | POA: Diagnosis not present

## 2016-08-19 DIAGNOSIS — R109 Unspecified abdominal pain: Secondary | ICD-10-CM

## 2016-08-19 LAB — CBC WITH DIFFERENTIAL/PLATELET
BASOS PCT: 0.5 % (ref 0.0–3.0)
Basophils Absolute: 0 10*3/uL (ref 0.0–0.1)
EOS ABS: 0 10*3/uL (ref 0.0–0.7)
EOS PCT: 0.1 % (ref 0.0–5.0)
HEMATOCRIT: 36.8 % — AB (ref 39.0–52.0)
HEMOGLOBIN: 12.4 g/dL — AB (ref 13.0–17.0)
LYMPHS PCT: 21.2 % (ref 12.0–46.0)
Lymphs Abs: 1 10*3/uL (ref 0.7–4.0)
MCHC: 33.8 g/dL (ref 30.0–36.0)
MCV: 83.9 fl (ref 78.0–100.0)
MONO ABS: 0.4 10*3/uL (ref 0.1–1.0)
Monocytes Relative: 8.9 % (ref 3.0–12.0)
Neutro Abs: 3.3 10*3/uL (ref 1.4–7.7)
Neutrophils Relative %: 69.3 % (ref 43.0–77.0)
Platelets: 222 10*3/uL (ref 150.0–400.0)
RBC: 4.39 Mil/uL (ref 4.22–5.81)
RDW: 15 % (ref 11.5–15.5)
WBC: 4.8 10*3/uL (ref 4.0–10.5)

## 2016-08-19 NOTE — Progress Notes (Signed)
Pre visit review using our clinic review tool, if applicable. No additional management support is needed unless otherwise documented below in the visit note. 

## 2016-08-19 NOTE — Progress Notes (Signed)
Sx started yesterday.  He thought he had a "stomach bug" with diarrhea yesterday x3, no blood in stool.   No vomiting.  No fevers.  Diarrhea resolved but still with abd pain today and back pain today.  Didn't have back/abd pain yesterday.  Last BM was yesterday.  Normal UOP.  Appetite okay, ate jello this AM.  Drank some gatorade today.  B abd pain, at the level of the umbilicus, constant today.  Hasn't had pain like this prev.    Meds, vitals, and allergies reviewed.   ROS: Per HPI unless specifically indicated in ROS section   nad ncat Mmm Neck supple, no LA rrr ctab abd with some abd pain at time of exam but not in distress and abd not ttp, no change in pain with palpation.  Normal BS and no cva pain No rash No BLE edema Well appearing.

## 2016-08-19 NOTE — Patient Instructions (Signed)
Avoid dairy.  Clear liquids for now.  Rest and update [place] as needed.  Okay to try gas x.  Go to the lab on the way out.  We'll contact you with your lab report. Take care.  Glad to see you.

## 2016-08-21 DIAGNOSIS — R197 Diarrhea, unspecified: Secondary | ICD-10-CM | POA: Insufficient documentation

## 2016-08-21 NOTE — Assessment & Plan Note (Signed)
Likely benign transient issue.  Nontoxic.  D/w pt.  Possibly infectious, ie viral.   Avoid dairy.  Clear liquids for now.  Rest and update [place] as needed.  Okay to try gas x.  He agrees.  See notes on labs.

## 2016-11-12 ENCOUNTER — Encounter: Payer: Self-pay | Admitting: Internal Medicine

## 2016-11-12 ENCOUNTER — Ambulatory Visit (INDEPENDENT_AMBULATORY_CARE_PROVIDER_SITE_OTHER): Payer: Medicare HMO | Admitting: Internal Medicine

## 2016-11-12 ENCOUNTER — Encounter (INDEPENDENT_AMBULATORY_CARE_PROVIDER_SITE_OTHER): Payer: Self-pay

## 2016-11-12 VITALS — BP 122/70 | HR 71 | Temp 98.1°F | Wt 238.0 lb

## 2016-11-12 DIAGNOSIS — R310 Gross hematuria: Secondary | ICD-10-CM | POA: Insufficient documentation

## 2016-11-12 LAB — POC URINALSYSI DIPSTICK (AUTOMATED)
BILIRUBIN UA: NEGATIVE
GLUCOSE UA: NEGATIVE
Ketones, UA: NEGATIVE
Leukocytes, UA: NEGATIVE — AB
NITRITE UA: NEGATIVE
Protein, UA: NEGATIVE
Spec Grav, UA: 1.02 (ref 1.010–1.025)
UROBILINOGEN UA: 0.2 U/dL
pH, UA: 6.5 (ref 5.0–8.0)

## 2016-11-12 NOTE — Progress Notes (Signed)
   Subjective:    Patient ID: Matthew Carter, male    DOB: 24-Feb-1941, 76 y.o.   MRN: [id number]  HPI Here due to blood in urine Noticed it yesterday Same as previous episode Has "grape soda" type red color  Had apparent self limited episode in December Possible cystitis--- got antibiotic  No dysuria Mild urgency always--no change No fever No back pain  Current Outpatient Prescriptions on File Prior to Visit  Medication Sig Dispense Refill  . amLODipine (NORVASC) 5 MG tablet TAKE 1 TABLET EVERY DAY FOR BLOOD PRESSURE 90 tablet 3  . aspirin 81 MG tablet Take 81 mg by mouth daily.    Marland Kitchen latanoprost (XALATAN) 0.005 % ophthalmic solution Place 1 drop into both eyes daily.     Marland Kitchen loratadine (CLARITIN) 10 MG tablet Take 10 mg by mouth daily as needed for allergies.    Marland Kitchen timolol (TIMOPTIC) 0.5 % ophthalmic solution Place 1 drop into both eyes daily.      No current facility-administered medications on file prior to visit.     No Known Allergies  Past Medical History:  Diagnosis Date  . Adenomatous polyps   . Allergy   . ED (erectile dysfunction)   . Glaucoma   . Glucose intolerance (impaired glucose tolerance)   . Hemorrhoids   . Hypertension     Past Surgical History:  Procedure Laterality Date  . NO PAST SURGERIES      Family History  Problem Relation Age of Onset  . GER disease Mother   . Hypertension Mother   . Stroke Father   . Hypertension Father   . Cancer Paternal Grandfather     Leukemia  . Diabetes Paternal Grandfather   . Cancer Brother     throat cancer  . Colon cancer Neg Hx     Social History   Social History  . Marital status: Married    Spouse name: N/A  . Number of children: 3  . Years of education: N/A   Occupational History  . Retired Control and instrumentation engineer   Social History Main Topics  . Smoking status: Former Smoker    Quit date: 08/04/1981  . Smokeless tobacco: Never Used  . Alcohol use No  . Drug use: No  .  Sexual activity: Not on file   Other Topics Concern  . Not on file   Social History Narrative   Has living will    No formal health care POA but requests wife   Would accept resuscitation attempts but no prolonged artificial ventilation   Would accept a feeding tube   Review of Systems Appetite is good No abdominal pain    Objective:   Physical Exam  Constitutional: He appears well-nourished. No distress.  Genitourinary:  Genitourinary Comments: Slight redness of glans but urethra looks normal Scrotum enlarged but no obvious testicular mass  Prostate fairly normal --normal consistency and median sulcus without nodule          Assessment & Plan:

## 2016-11-12 NOTE — Progress Notes (Signed)
Pre visit review using our clinic review tool, if applicable. No additional management support is needed unless otherwise documented below in the visit note. 

## 2016-11-12 NOTE — Assessment & Plan Note (Signed)
2 episodes now No symptoms of acute infection Doesn't sound like stone Was smoker in past Will refer to urology--will need imaging and cysto

## 2016-11-12 NOTE — Addendum Note (Signed)
Addended by: Pilar Grammes on: 11/12/2016 09:33 AM   Modules accepted: Orders

## 2016-11-17 ENCOUNTER — Encounter: Payer: Self-pay | Admitting: Urology

## 2016-11-17 ENCOUNTER — Ambulatory Visit (INDEPENDENT_AMBULATORY_CARE_PROVIDER_SITE_OTHER): Payer: Medicare HMO | Admitting: Urology

## 2016-11-17 VITALS — BP 155/80 | HR 85 | Ht 71.0 in | Wt 235.8 lb

## 2016-11-17 DIAGNOSIS — R310 Gross hematuria: Secondary | ICD-10-CM

## 2016-11-17 LAB — MICROSCOPIC EXAMINATION

## 2016-11-17 LAB — URINALYSIS, COMPLETE
Bilirubin, UA: NEGATIVE
GLUCOSE, UA: NEGATIVE
KETONES UA: NEGATIVE
Nitrite, UA: NEGATIVE
Protein, UA: NEGATIVE
SPEC GRAV UA: 1.01 (ref 1.005–1.030)
Urobilinogen, Ur: 0.2 mg/dL (ref 0.2–1.0)
pH, UA: 6.5 (ref 5.0–7.5)

## 2016-11-17 NOTE — Progress Notes (Signed)
11/17/2016 1:35 PM   Matthew Carter 1940-08-22 [id number]  Referring provider: Venia Carbon, MD [place], [place] [address]  Chief Complaint  Patient presents with  . New Patient (Initial Visit)    gross hematuria     HPI: I was consulted to assess the patient's history of gross hematuria that occurred one time last week not associated with pain, dysuria or increased frequency. He quit smoking 25 years ago or longer. He does take daily aspirin but no blood thinners. He has never had a kidney stone. He voids every 2 or 3 hours and usually gets up twice a night and reports a good flow.  He denies a history of previous GU surgery as bowel movements are normal  Modifying factors: There are no other modifying factors  Associated signs and symptoms: There are no other associated signs and symptoms Aggravating and relieving factors: There are no other aggravating or relieving factors Severity: Moderate Duration: Persistent     PMH: Past Medical History:  Diagnosis Date  . Adenomatous polyps   . Allergy   . ED (erectile dysfunction)   . Glaucoma   . Glucose intolerance (impaired glucose tolerance)   . Hemorrhoids   . Hypertension     Surgical History: Past Surgical History:  Procedure Laterality Date  . NO PAST SURGERIES      Home Medications:  Allergies as of 11/17/2016   No Known Allergies     Medication List       Accurate as of 11/17/16  1:35 PM. Always use your most recent med list.          amLODipine 5 MG tablet Commonly known as:  NORVASC TAKE 1 TABLET EVERY DAY FOR BLOOD PRESSURE   aspirin 81 MG tablet Take 81 mg by mouth daily.   latanoprost 0.005 % ophthalmic solution Commonly known as:  XALATAN Place 1 drop into both eyes daily.   loratadine 10 MG tablet Commonly known as:  CLARITIN Take 10 mg by mouth daily as needed for allergies.   timolol 0.5 % ophthalmic solution Commonly known as:  TIMOPTIC Place 1 drop into  both eyes daily.       Allergies: No Known Allergies  Family History: Family History  Problem Relation Age of Onset  . GER disease Mother   . Hypertension Mother   . Kidney failure Mother   . Stroke Father   . Hypertension Father   . Cancer Paternal Grandfather     Leukemia  . Diabetes Paternal Grandfather   . Cancer Brother     throat cancer  . Colon cancer Neg Hx     Social History:  reports that he quit smoking about 35 years ago. He has never used smokeless tobacco. He reports that he does not drink alcohol or use drugs.  ROS: UROLOGY Frequent Urination?: No Hard to postpone urination?: No Burning/pain with urination?: No Get up at night to urinate?: Yes Leakage of urine?: No Urine stream starts and stops?: No Trouble starting stream?: No Do you have to strain to urinate?: No Blood in urine?: Yes Urinary tract infection?: No Sexually transmitted disease?: No Injury to kidneys or bladder?: No Painful intercourse?: No Weak stream?: No Erection problems?: No Penile pain?: No  Gastrointestinal Nausea?: No Vomiting?: No Indigestion/heartburn?: No Diarrhea?: No Constipation?: No  Constitutional Fever: No Night sweats?: No Weight loss?: No Fatigue?: No  Skin Skin rash/lesions?: No Itching?: No  Eyes Blurred vision?: Yes Double vision?: No  Ears/Nose/Throat Sore throat?: No Sinus problems?: Yes  Hematologic/Lymphatic Swollen glands?: No Easy bruising?: No  Cardiovascular Leg swelling?: No Chest pain?: No  Respiratory Cough?: No Shortness of breath?: Yes  Endocrine Excessive thirst?: No  Musculoskeletal Back pain?: No Joint pain?: Yes  Neurological Headaches?: No Dizziness?: No  Psychologic Depression?: No Anxiety?: No  Physical Exam: BP (!) 155/80   Pulse 85   Ht 5\' 11"  (1.803 m)   Wt 235 lb 12.8 oz (107 kg)   BMI 32.89 kg/m   Constitutional:  Alert and oriented, No acute distress. HEENT: [place] AT, moist mucus membranes.   Trachea midline, no masses. Cardiovascular: No clubbing, cyanosis, or edema. Respiratory: Normal respiratory effort, no increased work of breathing. GI: Abdomen is soft, nontender, nondistended, no abdominal masses GU: No CVA tenderness. 50 g benign prostate and normal genitalia Skin: No rashes, bruises or suspicious lesions. Lymph: No cervical or inguinal adenopathy. Neurologic: Grossly intact, no focal deficits, moving all 4 extremities. Psychiatric: Normal mood and affect.  Laboratory Data: Lab Results  Component Value Date   WBC 4.8 08/19/2016   HGB 12.4 (L) 08/19/2016   HCT 36.8 (L) 08/19/2016   MCV 83.9 08/19/2016   PLT 222.0 08/19/2016    Lab Results  Component Value Date   CREATININE 1.28 09/06/2015    Lab Results  Component Value Date   PSA 2.97 11/14/2011   PSA 2.96 08/21/2009   PSA 1.92 08/18/2007    No results found for: TESTOSTERONE  Lab Results  Component Value Date   HGBA1C 6.3 09/06/2015    Urinalysis    Component Value Date/Time   COLORURINE YELLOW 08/11/2016 0820   APPEARANCEUR CLEAR 08/11/2016 0820   LABSPEC 1.015 08/11/2016 0820   PHURINE 6.0 08/11/2016 0820   GLUCOSEU NEGATIVE 08/11/2016 0820   HGBUR SMALL (A) 08/11/2016 0820   BILIRUBINUR negative 11/12/2016 0929   KETONESUR NEGATIVE 08/11/2016 0820   PROTEINUR negative 11/12/2016 0929   UROBILINOGEN 0.2 11/12/2016 0929   UROBILINOGEN 0.2 08/11/2016 0820   NITRITE negative 11/12/2016 0929   NITRITE NEGATIVE 08/11/2016 0820   LEUKOCYTESUR Negative (A) 11/12/2016 0929    Pertinent Imaging: None  Assessment & Plan:  The patient had an episode of gross hematuria. Workup described. I sent the urine for culture today. He had microscopic hematuria. He will return with a CT scan in for cystoscopy. Otherwise he has minimal voiding dysfunction and mild nighttime frequency  1. Gross hematuria 2. Nighttime frequency - Urinalysis, Complete   No Follow-up on file.  Reece Packer,  MD  [facility] [address] [place], [place] [address] [phone]

## 2016-11-20 LAB — CULTURE, URINE COMPREHENSIVE

## 2016-11-22 ENCOUNTER — Emergency Department
Admission: EM | Admit: 2016-11-22 | Discharge: 2016-11-23 | Disposition: A | Discharge status: Home / Self Care | Payer: Medicare HMO | Attending: Emergency Medicine | Admitting: Emergency Medicine

## 2016-11-22 DIAGNOSIS — Z7982 Long term (current) use of aspirin: Secondary | ICD-10-CM | POA: Insufficient documentation

## 2016-11-22 DIAGNOSIS — Z79899 Other long term (current) drug therapy: Secondary | ICD-10-CM | POA: Diagnosis not present

## 2016-11-22 DIAGNOSIS — I10 Essential (primary) hypertension: Secondary | ICD-10-CM | POA: Insufficient documentation

## 2016-11-22 DIAGNOSIS — K5900 Constipation, unspecified: Secondary | ICD-10-CM | POA: Diagnosis not present

## 2016-11-22 DIAGNOSIS — Z87891 Personal history of nicotine dependence: Secondary | ICD-10-CM | POA: Insufficient documentation

## 2016-11-22 NOTE — ED Triage Notes (Signed)
Patient reports no bowel movement for approximately 3 days.

## 2016-11-23 ENCOUNTER — Emergency Department: Payer: Medicare HMO

## 2016-11-23 DIAGNOSIS — K5900 Constipation, unspecified: Secondary | ICD-10-CM | POA: Diagnosis not present

## 2016-11-23 MED ORDER — MAGNESIUM CITRATE PO SOLN
1.0000 | Freq: Once | ORAL | Status: AC
  Administered 2016-11-23: 1 via ORAL
  Filled 2016-11-23: qty 296

## 2016-11-23 MED ORDER — FLEET ENEMA 7-19 GM/118ML RE ENEM
1.0000 | ENEMA | Freq: Once | RECTAL | Status: AC
  Administered 2016-11-23: 1 via RECTAL

## 2016-11-23 MED ORDER — SENNOSIDES-DOCUSATE SODIUM 8.6-50 MG PO TABS
2.0000 | ORAL_TABLET | Freq: Once | ORAL | Status: AC
  Administered 2016-11-23: 2 via ORAL
  Filled 2016-11-23: qty 2

## 2016-11-23 NOTE — ED Provider Notes (Signed)
[facility] Emergency Department Provider Note    First MD Initiated Contact with Patient 11/23/16 0020     (approximate)  I have reviewed the triage vital signs and the nursing notes.   HISTORY  Chief Complaint Constipation   HPI KENECHUKWU ECKSTEIN is a 76 y.o. male with below list of chronic medical conditions presents emergency department with 4 day history of constipation. Patient denies any abdominal pain no nausea vomiting no fever. Patient denies any urinary symptoms.   Past Medical History:  Diagnosis Date  . Adenomatous polyps   . Allergy   . ED (erectile dysfunction)   . Glaucoma   . Glucose intolerance (impaired glucose tolerance)   . Hemorrhoids   . Hypertension     Patient Active Problem List   Diagnosis Date Noted  . Gross hematuria 11/12/2016  . Diarrhea 08/21/2016  . Essential tremor 08/31/2015  . Advance directive discussed with patient 12/01/2014  . Personal history of colonic adenomas 06/28/2013  . Routine general medical examination at a health care facility 11/14/2011  . Glaucoma   . Abdominal pain 11/27/2010  . Essential hypertension, benign 06/27/2008  . ALLERGIC RHINITIS 03/24/2007    Past Surgical History:  Procedure Laterality Date  . NO PAST SURGERIES      Prior to Admission medications   Medication Sig Start Date End Date Taking? Authorizing Provider  amLODipine (NORVASC) 5 MG tablet TAKE 1 TABLET EVERY DAY FOR BLOOD PRESSURE 01/07/16   Venia Carbon, MD  aspirin 81 MG tablet Take 81 mg by mouth daily.    Historical Provider, MD  latanoprost (XALATAN) 0.005 % ophthalmic solution Place 1 drop into both eyes daily.  11/01/12   Historical Provider, MD  loratadine (CLARITIN) 10 MG tablet Take 10 mg by mouth daily as needed for allergies.    Historical Provider, MD  timolol (TIMOPTIC) 0.5 % ophthalmic solution Place 1 drop into both eyes daily.  09/19/14   Historical Provider, MD    Allergies No known drug  allergies  Family History  Problem Relation Age of Onset  . GER disease Mother   . Hypertension Mother   . Kidney failure Mother   . Stroke Father   . Hypertension Father   . Cancer Paternal Grandfather     Leukemia  . Diabetes Paternal Grandfather   . Cancer Brother     throat cancer  . Colon cancer Neg Hx     Social History Social History  Substance Use Topics  . Smoking status: Former Smoker    Quit date: 08/04/1981  . Smokeless tobacco: Never Used  . Alcohol use No    Review of Systems Constitutional: No fever/chills Eyes: No visual changes. ENT: No sore throat. Cardiovascular: Denies chest pain. Respiratory: Denies shortness of breath. Gastrointestinal: No abdominal pain.  No nausea, no vomiting.  No diarrhea.  Positive for constipation. Genitourinary: Negative for dysuria. Musculoskeletal: Negative for back pain. Skin: Negative for rash. Neurological: Negative for headaches, focal weakness or numbness.  10-point ROS otherwise negative.  ____________________________________________   PHYSICAL EXAM:  VITAL SIGNS: ED Triage Vitals [11/22/16 2350]  Enc Vitals Group     BP (!) 166/71     Pulse Rate 93     Resp 18     Temp 98.2 F (36.8 C)     Temp Source Oral     SpO2 95 %     Weight      Height      Head Circumference  Peak Flow      Pain Score      Pain Loc      Pain Edu?      Excl. in [place]?     Constitutional: Alert and oriented. Well appearing and in no acute distress. Eyes: Conjunctivae are normal. PERRL. EOMI. Head: Atraumatic. Mouth/Throat: Mucous membranes are moist.  Oropharynx non-erythematous. Neck: No stridor.   Cardiovascular: Normal rate, regular rhythm. Good peripheral circulation. Grossly normal heart sounds. Respiratory: Normal respiratory effort.  No retractions. Lungs CTAB. Gastrointestinal: Soft and nontender. No distention.   Musculoskeletal: No lower extremity tenderness nor edema. No gross deformities of  extremities. Neurologic:  Normal speech and language. No gross focal neurologic deficits are appreciated.  Skin:  Skin is warm, dry and intact. No rash noted. Psychiatric: Mood and affect are normal. Speech and behavior are normal.  RADIOLOGY I, [place], personally viewed and evaluated these images (plain radiographs) as part of my medical decision making, as well as reviewing the written report by the radiologist.  Dg Abdomen 1 View  Result Date: 11/23/2016 CLINICAL DATA:  No bowel movements for 3 days. Constipation. Initial encounter. EXAM: ABDOMEN - 1 VIEW COMPARISON:  None. FINDINGS: The visualized bowel gas pattern is unremarkable. Scattered air and stool filled loops of colon are seen; no abnormal dilatation of small bowel loops is seen to suggest small bowel obstruction. No free intra-abdominal air is identified, though evaluation for free air is limited on a single supine view. The visualized osseous structures are within normal limits; the sacroiliac joints are unremarkable in appearance. The visualized lung bases are essentially clear. IMPRESSION: Unremarkable bowel gas pattern; no free intra-abdominal air seen. Moderate amount of stool noted in the colon. Electronically Signed   By: Garald Balding M.D.   On: 11/23/2016 00:59     Procedures   ____________________________________________   INITIAL IMPRESSION / ASSESSMENT AND PLAN / ED COURSE  Pertinent labs & imaging results that were available during my care of the patient were reviewed by me and considered in my medical decision making (see chart for details).  Patient given fleets enema, magnesium citrate and senna plus. Patient advised to follow-up with primary care provider.     ____________________________________________  FINAL CLINICAL IMPRESSION(S) / ED DIAGNOSES  Final diagnoses:  Constipation, unspecified constipation type     MEDICATIONS GIVEN DURING THIS VISIT:  Medications  magnesium citrate  solution 1 Bottle (1 Bottle Oral Given 11/23/16 0111)  senna-docusate (Senokot-S) tablet 2 tablet (2 tablets Oral Given 11/23/16 0111)  sodium phosphate (FLEET) 7-19 GM/118ML enema 1 enema (1 enema Rectal Given 11/23/16 0112)     NEW OUTPATIENT MEDICATIONS STARTED DURING THIS VISIT:  New Prescriptions   No medications on file    Modified Medications   No medications on file    Discontinued Medications   No medications on file     Note:  This document was prepared using Dragon voice recognition software and may include unintentional dictation errors.    Gregor Hams, MD 11/23/16 [phone]

## 2016-11-23 NOTE — ED Notes (Signed)
Enema given to pt to take at home per dr. Owens Shark. Explanation of how to administer enema provided to pt who verbalizes understanding.

## 2016-11-24 ENCOUNTER — Telehealth: Payer: Self-pay

## 2016-11-24 NOTE — Telephone Encounter (Signed)
Spoke to pt. He said he is feeling much better. 

## 2016-11-25 ENCOUNTER — Telehealth: Payer: Self-pay | Admitting: Internal Medicine

## 2016-11-25 NOTE — Telephone Encounter (Signed)
PLEASE NOTE: All timestamps contained within this report are represented as [place] Standard Time. CONFIDENTIALTY NOTICE: This fax transmission is intended only for the addressee. It contains information that is legally privileged, confidential or otherwise protected from use or disclosure. If you are not the intended recipient, you are strictly prohibited from reviewing, disclosing, copying using or disseminating any of this information or taking any action in reliance on or regarding this information. If you have received this fax in error, please notify us immediately by telephone so that we can arrange for its return to [place]. Phone: [phone], Toll-Free: [phone], Fax: [phone] Page: 1 of 2 Call Id: [id number] [place] Patient Name: Matthew Carter Gender: Male DOB: June 04, 1941 Age: 76 Y 2 M 30 D Return Phone Number: [phone] (Primary), [phone] (Secondary) City/State/Zip: [place] [address] Client [facility] - Client Client Site [facility] - Day Physician Viviana Simpler - MD Who Is Calling Patient / Member / Family / Caregiver Call Type Triage / Clinical Relationship To Patient Self Return Phone Number [phone] (Primary) Chief Complaint Diarrhea Reason for Call Symptomatic / Request for Health Information Initial Comment been having loose stools since sunday, everything he eats passes right through Appointment Disposition EMR Appointment Not Necessary Info pasted into Epic Yes Nurse Assessment Nurse: Mallie Mussel, RN, Alveta Heimlich Date/Time (Eastern Time): 11/25/2016 9:09:17 AM Confirm and document reason for call. If symptomatic, describe symptoms. ---Caller states that he has had diarrhea since Sunday. He has had diarrhea x 3-4 in the last 24 hours. He last urinated about 40 minutes ago. Denies vomiting and fever. Does the PT have  any chronic conditions? (i.e. diabetes, asthma, etc.) ---Yes List chronic conditions. ---HTN Guidelines Guideline Title Affirmed Question Diarrhea MILD-MODERATE diarrhea (e.g., 1-6 times / day more than normal) Disp. Time Eilene Ghazi Time) Disposition Final User 11/25/2016 [place], RN, [facility] Advice Given Per Guideline * Sometimes the cause is an infection caused by a virus ('stomach flu) or a bacteria. Diarrhea is one of the body's way of getting rid of germs. HOME CARE: You should be able to treat this at home. * Certain foods (e.g. dairy products, supplements like Ensure) can also trigger diarrhea. * Drink more fluids, at least 8-10 cups daily. One cup equals 8 oz (240 ml). * WATER: For mild to moderate diarrhea, water is often the best liquid to drink. You should also eat some salty foods (e.g., potato chips, pretzels, saltine crackers). This is important to make sure you are getting enough salt, sugars, and fluids to meet your body's needs. * SPORTS DRINKS: You can also drink a sports drinks (e.g., Gatorade, Powerade) to help treat and prevent dehydration. For it to work best, mix it half and half with water. * Avoid caffeinated beverages (Reason: caffeine is mildly dehydrating). * Avoid alcohol beverages (beer, wine, hard liquor). * Maintaining some food intake during episodes of diarrhea is important. * Other foods that are OK include: bananas, yogurt, crackers, soup. * Begin with boiled starches / cereals (e.g., potatoes, rice, noodles, wheat, oats) with a small amount of salt to taste. OTC MEDS - Loperamide (Imodium AD): * DO NOT use if there is a fever over 100.5 F (38.1 C) or if there is blood or mucus in the stools. * Be certain to wash your hands after using the restroom. * If your work is cooking, Research scientist (medical), serving or preparing  food, then you should not work until the diarrhea has completely stopped. EXPECTED COURSE: Viral diarrhea lasts 4-7 days. Always  worse PLEASE NOTE: All timestamps contained within this report are represented as [place] Standard Time. CONFIDENTIALTY NOTICE: This fax transmission is intended only for the addressee. It contains information that is legally privileged, confidential or otherwise protected from use or disclosure. If you are not the intended recipient, you are strictly prohibited from reviewing, disclosing, copying using or disseminating any of this information or taking any action in reliance on or regarding this information. If you have received this fax in error, please notify us immediately by telephone so that we can arrange for its return to [place]. Phone: [phone], Toll-Free: [phone], Fax: [phone] Page: 2 of 2 Call Id: [id number] Care Advice Given Per Guideline on days 1 and 2. * Signs of dehydration occur (e.g., no urine over 12 hours, very dry mouth, lightheaded, etc.) * Diarrhea persists over 7 days * You become worse.

## 2016-11-25 NOTE — Telephone Encounter (Signed)
Please check on him tomorrow Make sure he is feeling better

## 2016-11-25 NOTE — Telephone Encounter (Signed)
He went to the ER over the weekend for constipation.

## 2016-11-25 NOTE — Telephone Encounter (Signed)
Patient Name: Matthew Carter  DOB: 08/08/1940    Initial Comment been having loose stools since sunday, everything he eats passes right through    Nurse Assessment  Nurse: Mallie Mussel, RN, Alveta Heimlich Date/Time Eilene Ghazi Time): 11/25/2016 9:09:17 AM  Confirm and document reason for call. If symptomatic, describe symptoms. ---Caller states that he has had diarrhea since Sunday. He has had diarrhea x 3-4 in the last 24 hours. He last urinated about 40 minutes ago. Denies vomiting and fever.  Does the patient have any new or worsening symptoms? ---Yes  Will a triage be completed? ---Yes  Related visit to physician within the last 2 weeks? ---No  Does the PT have any chronic conditions? (i.e. diabetes, asthma, etc.) ---Yes  List chronic conditions. ---HTN  Is this a behavioral health or substance abuse call? ---No     Guidelines    Guideline Title Affirmed Question Affirmed Notes  Diarrhea MILD-MODERATE diarrhea (e.g., 1-6 times / day more than normal)    Final Disposition User   [place], RN, Alveta Heimlich    Disagree/Comply: Leta Baptist

## 2016-11-26 NOTE — Telephone Encounter (Signed)
Spoke to pt. He said the diarrhea has gotten better. Eating yogurt

## 2016-11-26 NOTE — Telephone Encounter (Signed)
Left message to call office

## 2016-12-01 ENCOUNTER — Ambulatory Visit
Admission: RE | Admit: 2016-12-01 | Discharge: 2016-12-01 | Disposition: A | Discharge status: Home / Self Care | Payer: Medicare HMO | Source: Ambulatory Visit | Attending: Urology | Admitting: Urology

## 2016-12-01 DIAGNOSIS — K429 Umbilical hernia without obstruction or gangrene: Secondary | ICD-10-CM | POA: Diagnosis not present

## 2016-12-01 DIAGNOSIS — N2889 Other specified disorders of kidney and ureter: Secondary | ICD-10-CM | POA: Diagnosis not present

## 2016-12-01 DIAGNOSIS — R310 Gross hematuria: Secondary | ICD-10-CM | POA: Insufficient documentation

## 2016-12-01 DIAGNOSIS — N3289 Other specified disorders of bladder: Secondary | ICD-10-CM | POA: Diagnosis not present

## 2016-12-01 LAB — POCT I-STAT CREATININE: Creatinine, Ser: 1.1 mg/dL (ref 0.61–1.24)

## 2016-12-01 MED ORDER — IOPAMIDOL (ISOVUE-300) INJECTION 61%
125.0000 mL | Freq: Once | INTRAVENOUS | Status: AC | PRN
  Administered 2016-12-01: 125 mL via INTRAVENOUS

## 2016-12-05 ENCOUNTER — Encounter: Payer: Self-pay | Admitting: Internal Medicine

## 2016-12-05 ENCOUNTER — Ambulatory Visit (INDEPENDENT_AMBULATORY_CARE_PROVIDER_SITE_OTHER): Payer: Medicare HMO | Admitting: Internal Medicine

## 2016-12-05 ENCOUNTER — Ambulatory Visit (INDEPENDENT_AMBULATORY_CARE_PROVIDER_SITE_OTHER): Payer: Medicare HMO

## 2016-12-05 VITALS — BP 142/70 | HR 90 | Temp 98.6°F | Ht 70.75 in | Wt 230.0 lb

## 2016-12-05 VITALS — BP 128/80 | HR 90 | Temp 98.6°F | Ht 70.75 in | Wt 230.0 lb

## 2016-12-05 DIAGNOSIS — G250 Essential tremor: Secondary | ICD-10-CM

## 2016-12-05 DIAGNOSIS — Z0000 Encounter for general adult medical examination without abnormal findings: Secondary | ICD-10-CM

## 2016-12-05 DIAGNOSIS — R310 Gross hematuria: Secondary | ICD-10-CM

## 2016-12-05 DIAGNOSIS — R200 Anesthesia of skin: Secondary | ICD-10-CM | POA: Insufficient documentation

## 2016-12-05 DIAGNOSIS — I10 Essential (primary) hypertension: Secondary | ICD-10-CM

## 2016-12-05 DIAGNOSIS — E538 Deficiency of other specified B group vitamins: Secondary | ICD-10-CM | POA: Insufficient documentation

## 2016-12-05 DIAGNOSIS — H409 Unspecified glaucoma: Secondary | ICD-10-CM

## 2016-12-05 LAB — COMPREHENSIVE METABOLIC PANEL
ALBUMIN: 3.9 g/dL (ref 3.5–5.2)
ALK PHOS: 88 U/L (ref 39–117)
ALT: 14 U/L (ref 0–53)
AST: 16 U/L (ref 0–37)
BUN: 18 mg/dL (ref 6–23)
CO2: 33 mEq/L — ABNORMAL HIGH (ref 19–32)
CREATININE: 1.12 mg/dL (ref 0.40–1.50)
Calcium: 9.9 mg/dL (ref 8.4–10.5)
Chloride: 103 mEq/L (ref 96–112)
GFR: 82.36 mL/min (ref >60.00)
GLUCOSE: 100 mg/dL — AB (ref 70–99)
Potassium: 4.4 mEq/L (ref 3.5–5.1)
SODIUM: 140 meq/L (ref 135–145)
TOTAL PROTEIN: 7.4 g/dL (ref 6.0–8.3)
Total Bilirubin: 0.4 mg/dL (ref 0.2–1.2)

## 2016-12-05 LAB — CBC WITH DIFFERENTIAL/PLATELET
Basophils Absolute: 0 10*3/uL (ref 0.0–0.1)
Basophils Relative: 1 % (ref 0.0–3.0)
Eosinophils Absolute: 0.1 10*3/uL (ref 0.0–0.7)
Eosinophils Relative: 1.3 % (ref 0.0–5.0)
HCT: 40.1 % (ref 39.0–52.0)
HEMOGLOBIN: 13 g/dL (ref 13.0–17.0)
LYMPHS ABS: 2.1 10*3/uL (ref 0.7–4.0)
Lymphocytes Relative: 44.2 % (ref 12.0–46.0)
MCHC: 32.3 g/dL (ref 30.0–36.0)
MCV: 86.5 fl (ref 78.0–100.0)
MONO ABS: 0.4 10*3/uL (ref 0.1–1.0)
Monocytes Relative: 7.6 % (ref 3.0–12.0)
NEUTROS PCT: 45.9 % (ref 43.0–77.0)
Neutro Abs: 2.2 10*3/uL (ref 1.4–7.7)
Platelets: 269 10*3/uL (ref 150.0–400.0)
RBC: 4.64 Mil/uL (ref 4.22–5.81)
RDW: 14.9 % (ref 11.5–15.5)
WBC: 4.7 10*3/uL (ref 4.0–10.5)

## 2016-12-05 LAB — T4, FREE: FREE T4: 1.12 ng/dL (ref 0.60–1.60)

## 2016-12-05 LAB — VITAMIN B12: VITAMIN B 12: 260 pg/mL (ref 211–911)

## 2016-12-05 NOTE — Progress Notes (Signed)
Subjective:   Matthew Carter is a 76 y.o. male who presents for Medicare Annual/Subsequent preventive visit.   Review of Systems: N/A Cardiac Risk Factors include: advanced age (>33men, >46 women);male gender;hypertension;obesity (BMI >30kg/m2)     Objective:    Vitals: BP (!) 142/70 (BP Location: Right Arm, Patient Position: Sitting, Cuff Size: Normal)   Pulse 90   Temp 98.6 F (37 C) (Oral)   Ht 5' 10.75" (1.797 m) Comment: no shoes  Wt 230 lb (104.3 kg)   SpO2 93%   BMI 32.31 kg/m   Body mass index is 32.31 kg/m.  Tobacco History  Smoking Status  . Former Smoker  . Quit date: 08/04/1981  Smokeless Tobacco  . Never Used     Counseling given: No   Past Medical History:  Diagnosis Date  . Adenomatous polyps   . Allergy   . ED (erectile dysfunction)   . Glaucoma   . Glucose intolerance (impaired glucose tolerance)   . Hemorrhoids   . Hypertension    Past Surgical History:  Procedure Laterality Date  . NO PAST SURGERIES     Family History  Problem Relation Age of Onset  . GER disease Mother   . Hypertension Mother   . Kidney failure Mother   . Stroke Father   . Hypertension Father   . Cancer Paternal Grandfather     Leukemia  . Diabetes Paternal Grandfather   . Cancer Brother     throat cancer  . Colon cancer Neg Hx    History  Sexual Activity  . Sexual activity: Not on file    Outpatient Encounter Prescriptions as of 12/05/2016  Medication Sig  . amLODipine (NORVASC) 5 MG tablet TAKE 1 TABLET EVERY DAY FOR BLOOD PRESSURE  . aspirin 81 MG tablet Take 81 mg by mouth daily.  Marland Kitchen latanoprost (XALATAN) 0.005 % ophthalmic solution Place 1 drop into both eyes daily.   Marland Kitchen loratadine (CLARITIN) 10 MG tablet Take 10 mg by mouth daily as needed for allergies.  Marland Kitchen timolol (TIMOPTIC) 0.5 % ophthalmic solution Place 1 drop into both eyes daily.    No facility-administered encounter medications on file as of 12/05/2016.     Activities of Daily Living In your  present state of health, do you have any difficulty performing the following activities: 12/05/2016  Hearing? N  Vision? N  Difficulty concentrating or making decisions? N  Walking or climbing stairs? N  Dressing or bathing? N  Doing errands, shopping? N  Preparing Food and eating ? N  Using the Toilet? N  In the past six months, have you accidently leaked urine? N  Do you have problems with loss of bowel control? N  Managing your Medications? N  Managing your Finances? N  Housekeeping or managing your Housekeeping? N  Some recent data might be hidden    Patient Care Team: Venia Carbon, MD as PCP - General   Assessment:      Hearing Screening   125Hz  250Hz  500Hz  1000Hz  2000Hz  3000Hz  4000Hz  6000Hz  8000Hz   Right ear:   0 0 0  40    Left ear:   0 0 40  0    Vision Screening Comments: Last vision exam in June 2017 with Dr. Wallace Going    Exercise Activities and Dietary recommendations Current Exercise Habits: The patient has a physically strenous job, but has no regular exercise apart from work. (pt employed PT as school custodian 4 hours daily), Exercise limited by: None identified  Goals    . 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        Fall Risk Fall Risk  12/05/2016 12/05/2015 12/01/2014 11/29/2013 11/29/2013  Falls in the past year? No No No No No   Depression Screen PHQ 2/9 Scores 12/05/2016 12/05/2015 12/01/2014 11/29/2013  PHQ - 2 Score 0 0 0 0    Cognitive Function MMSE - Mini Mental State Exam 12/05/2016  Orientation to time 5  Orientation to Place 5  Registration 3  Attention/ Calculation 0  Recall 3  Language- name 2 objects 0  Language- repeat 1  Language- follow 3 step command 3  Language- read & follow direction 0  Write a sentence 0  Copy design 0  Total score 20     PLEASE NOTE: A Mini-Cog screen was completed. Maximum score is 20. A value of 0 denotes this part of  [place] MMSE was not completed or the patient failed this part of the Mini-Cog screening.   Mini-Cog Screening Orientation to Time - Max 5 pts Orientation to Place - Max 5 pts Registration - Max 3 pts Recall - Max 3 pts Language Repeat - Max 1 pts Language Follow 3 Step Command - Max 3 pts     Immunization History  Administered Date(s) Administered  . Influenza Split 05/01/2011, 04/26/2012  . Influenza Whole 05/04/2001, 05/24/2009, 04/12/2010  . Influenza,inj,Quad PF,36+ Mos 04/28/2013, 04/19/2014, 05/02/2015, 04/09/2016  . Pneumococcal Conjugate-13 11/29/2013  . Pneumococcal Polysaccharide-23 08/21/2009  . Td 08/19/2002, 11/24/2012   Screening Tests Health Maintenance  Topic Date Due  . INFLUENZA VACCINE  03/04/2017  . COLONOSCOPY  06/28/2018  . TETANUS/TDAP  11/25/2022  . PNA vac Low Risk Adult  Completed      Plan:     I have personally reviewed and addressed the Medicare Annual Wellness questionnaire and have noted the following in the patient's chart:  A. Medical and social history B. Use of alcohol, tobacco or illicit drugs  C. Current medications and supplements D. Functional ability and status E.  Nutritional status F.  Physical activity G. Advance directives H. List of other physicians I.  Hospitalizations, surgeries, and ER visits in previous 12 months J.  Pope to include hearing, vision, cognitive, depression L. Referrals and appointments - none  In addition, I have reviewed and discussed with patient certain preventive protocols, quality metrics, and best practice recommendations. A written personalized care plan for preventive services as well as general preventive health recommendations were provided to patient.  See attached scanned questionnaire for additional information.   Signed,   Lindell Noe, MHA, BS, LPN Health Coach

## 2016-12-05 NOTE — Patient Instructions (Signed)
Matthew Carter , Thank you for taking time to come for your Medicare Wellness Visit. I appreciate your ongoing commitment to your health goals. Please review the following plan we discussed and let me know if I can assist you in the future.   These are the goals we discussed: Goals    . 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         This is a list of the screening recommended for you and due dates:  Health Maintenance  Topic Date Due  . Flu Shot  03/04/2017  . Colon Cancer Screening  06/28/2018  . Tetanus Vaccine  11/25/2022  . Pneumonia vaccines  Completed   Preventive Care for Adults  A healthy lifestyle and preventive care can promote health and wellness. Preventive health guidelines for adults include the following key practices.  . A routine yearly physical is a good way to check with your health care provider about your health and preventive screening. It is a chance to share any concerns and updates on your health and to receive a thorough exam.  . Visit your dentist for a routine exam and preventive care every 6 months. Brush your teeth twice a day and floss once a day. Good oral hygiene prevents tooth decay and gum disease.  . The frequency of eye exams is based on your age, health, family medical history, use  of contact lenses, and other factors. Follow your health care provider's ecommendations for frequency of eye exams.  . Eat a healthy diet. Foods like vegetables, fruits, whole grains, low-fat dairy products, and lean protein foods contain the nutrients you need without too many calories. Decrease your intake of foods high in solid fats, added sugars, and salt. Eat the right amount of calories for you. Get information about a proper diet from your health care provider, if necessary.  . Regular physical exercise is one of the most important things you can do for your health. Most adults  should get at least 150 minutes of moderate-intensity exercise (any activity that increases your heart rate and causes you to sweat) each week. In addition, most adults need muscle-strengthening exercises on 2 or more days a week.  Silver Sneakers may be a benefit available to you. To determine eligibility, you may visit the website: www.silversneakers.com or contact program at [phone] Mon-Fri between 8AM-8PM.   . Maintain a healthy weight. The body mass index (BMI) is a screening tool to identify possible weight problems. It provides an estimate of body fat based on height and weight. Your health care provider can find your BMI and can help you achieve or maintain a healthy weight.   For adults 20 years and older: ? A BMI below 18.5 is considered underweight. ? A BMI of 18.5 to 24.9 is normal. ? A BMI of 25 to 29.9 is considered overweight. ? A BMI of 30 and above is considered obese.   . Maintain normal blood lipids and cholesterol levels by exercising and minimizing your intake of saturated fat. Eat a balanced diet with plenty of fruit and vegetables. Blood tests for lipids and cholesterol should begin at age 67 and be repeated every 5 years. If your lipid or cholesterol levels are high, you are over 50, or you are at high risk for heart disease, you may need your cholesterol levels checked more frequently. Ongoing high lipid  and cholesterol levels should be treated with medicines if diet and exercise are not working.  . If you smoke, find out from your health care provider how to quit. If you do not use tobacco, please do not start.  . If you choose to drink alcohol, please do not consume more than 2 drinks per day. One drink is considered to be 12 ounces (355 mL) of beer, 5 ounces (148 mL) of wine, or 1.5 ounces (44 mL) of liquor.  . If you are 4-77 years old, ask your health care provider if you should take aspirin to prevent strokes.  . Use sunscreen. Apply sunscreen liberally and  repeatedly throughout the day. You should seek shade when your shadow is shorter than you. Protect yourself by wearing long sleeves, pants, a wide-brimmed hat, and sunglasses year round, whenever you are outdoors.  . Once a month, do a whole body skin exam, using a mirror to look at the skin on your back. Tell your health care provider of new moles, moles that have irregular borders, moles that are larger than a pencil eraser, or moles that have changed in shape or color.

## 2016-12-05 NOTE — Assessment & Plan Note (Signed)
BP Readings from Last 3 Encounters:  12/05/16 128/80  12/05/16 (!) 142/70  11/23/16 (!) 161/70   Good control No changes needed

## 2016-12-05 NOTE — Progress Notes (Signed)
Pre visit review using our clinic review tool, if applicable. No additional management support is needed unless otherwise documented below in the visit note. 

## 2016-12-05 NOTE — Assessment & Plan Note (Signed)
In feet No pain Will check labs

## 2016-12-05 NOTE — Assessment & Plan Note (Signed)
Reviewed CT scan with him Does have lesion in bladder Discussed that he needs to follow up with cystoscopy and then plans can be made based on the findings

## 2016-12-05 NOTE — Assessment & Plan Note (Signed)
Mild  Doing well without Rx

## 2016-12-05 NOTE — Progress Notes (Signed)
PCP notes:   Health maintenance:  No gaps identified.   Abnormal screenings:   Hearing - failed  Patient concerns:   None  Nurse concerns:  None  Next PCP appt:   12/05/16 @ 1030

## 2016-12-05 NOTE — Progress Notes (Signed)
   Subjective:    Patient ID: Matthew Carter, male    DOB: 02/11/1941, 76 y.o.   MRN: [id number]  HPI  I reviewed health advisor's note, was available for consultation, and agree with documentation and plan.   Review of Systems     Objective:   Physical Exam        Assessment & Plan:

## 2016-12-05 NOTE — Assessment & Plan Note (Signed)
On Rx Keeps up with eye doctor

## 2016-12-05 NOTE — Progress Notes (Signed)
Subjective:    Patient ID: MILAD BUBLITZ, male    DOB: 02-06-1941, 76 y.o.   MRN: [id number]  HPI Here for follow up of chronic health conditions Had wellness visit earlier today  Has some numbness on the bottom of his feet No pain No trouble walking No problems with hands  BP has been okay No chest pain or SOB No dizziness or syncope No edema No headaches Still working part time  Still has minor tremor Mostly in right arm No problems with ADLs  Did see the urologist Had CT scan--- hadn't heard anything Cystoscopy planned  Current Outpatient Prescriptions on File Prior to Visit  Medication Sig Dispense Refill  . amLODipine (NORVASC) 5 MG tablet TAKE 1 TABLET EVERY DAY FOR BLOOD PRESSURE 90 tablet 3  . aspirin 81 MG tablet Take 81 mg by mouth daily.    Marland Kitchen latanoprost (XALATAN) 0.005 % ophthalmic solution Place 1 drop into both eyes daily.     Marland Kitchen loratadine (CLARITIN) 10 MG tablet Take 10 mg by mouth daily as needed for allergies.    Marland Kitchen timolol (TIMOPTIC) 0.5 % ophthalmic solution Place 1 drop into both eyes daily.      No current facility-administered medications on file prior to visit.     No Known Allergies  Past Medical History:  Diagnosis Date  . Adenomatous polyps   . Allergy   . ED (erectile dysfunction)   . Glaucoma   . Glucose intolerance (impaired glucose tolerance)   . Hemorrhoids   . Hypertension     Past Surgical History:  Procedure Laterality Date  . NO PAST SURGERIES      Family History  Problem Relation Age of Onset  . GER disease Mother   . Hypertension Mother   . Kidney failure Mother   . Stroke Father   . Hypertension Father   . Cancer Paternal Grandfather     Leukemia  . Diabetes Paternal Grandfather   . Cancer Brother     throat cancer  . Colon cancer Neg Hx     Social History   Social History  . Marital status: Married    Spouse name: N/A  . Number of children: 3  . Years of education: N/A   Occupational History  .  Retired Control and instrumentation engineer   Social History Main Topics  . Smoking status: Former Smoker    Quit date: 08/04/1981  . Smokeless tobacco: Never Used  . Alcohol use No  . Drug use: No  . Sexual activity: Not on file   Other Topics Concern  . Not on file   Social History Narrative   Has living will    No formal health care POA but requests wife   Would accept resuscitation attempts but no prolonged artificial ventilation   Would accept a feeding tube   Review of Systems Has lost 5# in the past year Continues on drops for glaucoma---keeps up with the eye doctor Sleeps well Bowels are okay    Objective:   Physical Exam  Constitutional: He appears well-developed and well-nourished. No distress.  HENT:  Mouth/Throat: Oropharynx is clear and moist. No oropharyngeal exudate.  Neck: No thyromegaly present.  Cardiovascular: Normal rate, regular rhythm, normal heart sounds and intact distal pulses.  Exam reveals no gallop.   No murmur heard. Pulmonary/Chest: Effort normal and breath sounds normal. No respiratory distress.  Abdominal: Soft. There is no tenderness.  Reducible umbilical hernia  Musculoskeletal: He exhibits no edema or tenderness.  Lymphadenopathy:    He has no cervical adenopathy.  Skin: No erythema.  Psychiatric: He has a normal mood and affect. His behavior is normal.          Assessment & Plan:

## 2016-12-11 ENCOUNTER — Telehealth: Payer: Self-pay | Admitting: Urology

## 2016-12-11 NOTE — Telephone Encounter (Signed)
Pt called office yesterday, was told a nurse would call him back, hasn't heard from anyone and is calling back today, Pt is aware of upcoming appointment but has questions and still has concerns about blood urine, Pt wants some reassurance as to if this is normal and to be concerned or not. Please advise.

## 2016-12-11 NOTE — Telephone Encounter (Signed)
Spoke with pt in reference to gross hematuria. Made pt aware cysto appt has been moved up. Pt voiced understanding.

## 2016-12-11 NOTE — Telephone Encounter (Signed)
I havent called the pt back yet. CT results show concerns of bladder cancer. Can we move up his cysto appt?

## 2016-12-11 NOTE — Telephone Encounter (Signed)
App has been moved to 12-18-16 @ 8:00 with Pilar Jarvis please notify the patient.  Thanks  michelle

## 2016-12-18 ENCOUNTER — Telehealth: Payer: Self-pay | Admitting: Radiology

## 2016-12-18 ENCOUNTER — Other Ambulatory Visit: Payer: Self-pay | Admitting: Radiology

## 2016-12-18 ENCOUNTER — Encounter: Payer: Self-pay | Admitting: Urology

## 2016-12-18 ENCOUNTER — Ambulatory Visit: Payer: Medicare HMO | Admitting: Urology

## 2016-12-18 VITALS — BP 149/71 | HR 83 | Ht 70.5 in | Wt 220.0 lb

## 2016-12-18 DIAGNOSIS — R310 Gross hematuria: Secondary | ICD-10-CM

## 2016-12-18 DIAGNOSIS — D494 Neoplasm of unspecified behavior of bladder: Secondary | ICD-10-CM

## 2016-12-18 LAB — URINALYSIS, COMPLETE
BILIRUBIN UA: NEGATIVE
Glucose, UA: NEGATIVE
Ketones, UA: NEGATIVE
NITRITE UA: NEGATIVE
PH UA: 6 (ref 5.0–7.5)
Protein, UA: NEGATIVE
Specific Gravity, UA: 1.01 (ref 1.005–1.030)
UUROB: 0.2 mg/dL (ref 0.2–1.0)

## 2016-12-18 LAB — MICROSCOPIC EXAMINATION

## 2016-12-18 MED ORDER — LIDOCAINE HCL 2 % EX GEL
1.0000 "application " | Freq: Once | CUTANEOUS | Status: AC
  Administered 2016-12-18: 1 via URETHRAL

## 2016-12-18 MED ORDER — CIPROFLOXACIN HCL 500 MG PO TABS
500.0000 mg | ORAL_TABLET | Freq: Once | ORAL | Status: AC
  Administered 2016-12-18: 500 mg via ORAL

## 2016-12-18 NOTE — Progress Notes (Signed)
   12/18/16  CC: Gross hematuria  HPI: The patient is a 76 year old gentleman presents today for completion of his gross hematuria workup. 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 There were no other abnormalities noted on the CT scan. He presents today for cystoscopy.  There were no vitals taken for this visit. NED. A&Ox3.   No respiratory distress   Abd soft, NT, ND Normal phallus with bilateral descended testicles  Cystoscopy Procedure Note  Patient identification was confirmed, informed consent was obtained, and patient was prepped using Betadine solution.  Lidocaine jelly was administered per urethral meatus.    Preoperative abx where received prior to procedure.     Pre-Procedure: - Inspection reveals a normal caliber ureteral meatus.  Procedure: The flexible cystoscope was introduced without difficulty - No urethral strictures/lesions are present. - Enlarged prostate Visually obstructive - 5 cm - Normal bladder neck - Bilateral ureteral orifices identified - Bladder mucosa  reveals 2-3 cm exophytic tumor on right anterior lateral wall - No bladder stones - No trabeculation  Retroflexion shows no intravesical lobe   Post-Procedure: - Patient tolerated the procedure well  Assessment/ Plan:  1. Bladder tumor 2. 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 We discussed the next 70 transurethral resection of bladder tumor. We discussed the risks, benefits, indications of this procedure. He understands the risks include but are not limited to bleeding, infection, need for repeat procedure, iatrogenic injury, and need for Foley catheter. All questions were answered. The patient will follow-up with the TURBT in the OR.

## 2016-12-18 NOTE — Telephone Encounter (Signed)
Confirmed surgery scheduled with Dr Pilar Jarvis on 01/09/17 per pt request, pre-admit testing appt on 12/30/16 @9 :00 & to call day prior to surgery for arrival time to SDS. Advised pt to hold ASA 81mg  beginning 01/02/17 per Dr Pilar Jarvis. Pt voices understanding.

## 2016-12-21 LAB — CULTURE, URINE COMPREHENSIVE

## 2016-12-22 ENCOUNTER — Other Ambulatory Visit: Payer: Self-pay

## 2016-12-30 ENCOUNTER — Encounter
Admission: RE | Admit: 2016-12-30 | Discharge: 2016-12-30 | Disposition: A | Discharge status: Home / Self Care | Payer: Medicare HMO | Source: Ambulatory Visit | Attending: Urology | Admitting: Urology

## 2016-12-30 DIAGNOSIS — I447 Left bundle-branch block, unspecified: Secondary | ICD-10-CM | POA: Insufficient documentation

## 2016-12-30 DIAGNOSIS — R9431 Abnormal electrocardiogram [ECG] [EKG]: Secondary | ICD-10-CM | POA: Diagnosis not present

## 2016-12-30 DIAGNOSIS — Z01818 Encounter for other preprocedural examination: Secondary | ICD-10-CM | POA: Diagnosis not present

## 2016-12-30 DIAGNOSIS — R310 Gross hematuria: Secondary | ICD-10-CM | POA: Insufficient documentation

## 2016-12-30 DIAGNOSIS — N329 Bladder disorder, unspecified: Secondary | ICD-10-CM | POA: Insufficient documentation

## 2016-12-30 DIAGNOSIS — I10 Essential (primary) hypertension: Secondary | ICD-10-CM | POA: Insufficient documentation

## 2016-12-30 DIAGNOSIS — Z01812 Encounter for preprocedural laboratory examination: Secondary | ICD-10-CM | POA: Insufficient documentation

## 2016-12-30 LAB — CBC
HEMATOCRIT: 35 % — AB (ref 40.0–52.0)
HEMOGLOBIN: 11.8 g/dL — AB (ref 13.0–18.0)
MCH: 28.2 pg (ref 26.0–34.0)
MCHC: 33.6 g/dL (ref 32.0–36.0)
MCV: 84 fL (ref 80.0–100.0)
Platelets: 236 10*3/uL (ref 150–440)
RBC: 4.17 MIL/uL — AB (ref 4.40–5.90)
RDW: 14.8 % — ABNORMAL HIGH (ref 11.5–14.5)
WBC: 4.5 10*3/uL (ref 3.8–10.6)

## 2016-12-30 LAB — BASIC METABOLIC PANEL
ANION GAP: 5 (ref 5–15)
BUN: 14 mg/dL (ref 6–20)
CALCIUM: 9.3 mg/dL (ref 8.9–10.3)
CO2: 29 mmol/L (ref 22–32)
Chloride: 106 mmol/L (ref 101–111)
Creatinine, Ser: 1.15 mg/dL (ref 0.61–1.24)
Glucose, Bld: 103 mg/dL — ABNORMAL HIGH (ref 65–99)
POTASSIUM: 4 mmol/L (ref 3.5–5.1)
Sodium: 140 mmol/L (ref 135–145)

## 2016-12-30 LAB — PROTIME-INR
INR: 1.11
PROTHROMBIN TIME: 14.3 s (ref 11.4–15.2)

## 2016-12-30 LAB — APTT: APTT: 30 s (ref 24–36)

## 2016-12-30 NOTE — Pre-Procedure Instructions (Addendum)
EKG COMPARED WITH 2011 AND KNOWN LBBB. LEFT WITHOUT SIGNING INSTRUCTIONS AND COPY MAILED.

## 2016-12-30 NOTE — Patient Instructions (Signed)
Your procedure is scheduled on: 01/09/17 Report to Same Day Surgery 2nd floor medical mall [facility] Entrance-take elevator on left to 2nd floor.  Check in with surgery information desk.) To find out your arrival time please call [phone] between 1PM - 3PM on  01/08/17  Remember: Instructions that are not followed completely may result in serious medical risk, up to and including death, or upon the discretion of your surgeon and anesthesiologist your surgery may need to be rescheduled.    _x___ 1. Do not eat food or drink liquids after midnight. No gum chewing or                              hard candies.     __x__ 2. No Alcohol for 24 hours before or after surgery.   __x__3. No Smoking for 24 prior to surgery.   ____  4. Bring all medications with you on the day of surgery if instructed.    __x__ 5. Notify your doctor if there is any change in your medical condition     (cold, fever, infections).     Do not wear jewelry, make-up, hairpins, clips or nail polish.  Do not wear lotions, powders, or perfumes. You may wear deodorant.  Do not shave 48 hours prior to surgery. Men may shave face and neck.  Do not bring valuables to the hospital.    [facility] is not responsible for any belongings or valuables.               Contacts, dentures or bridgework may not be worn into surgery.  Leave your suitcase in the car. After surgery it may be brought to your room.  For patients admitted to the hospital, discharge time is determined by your                       treatment team.   Patients discharged the day of surgery will not be allowed to drive home.  You will need someone to drive you home and stay with you the night of your procedure.    Please read over the following fact sheets that you were given:   [facility] Preparing for Surgery and or   _x___ Take anti-hypertensive (unless it includes a diuretic), cardiac, seizure, asthma,     anti-reflux and psychiatric medicines. These  include:  1. NORVASC  2.  3.  4.  5.  6.  ____Fleets enema or Magnesium Citrate as directed.   ___ Use CHG Soap or sage wipes as directed on instruction sheet   ____ Use inhalers on the day of surgery and bring to hospital day of surgery  ____ Stop Metformin and Janumet 2 days prior to surgery.    ____ Take 1/2 of usual insulin dose the night before surgery and none on the morning     surgery.   _x___ Follow recommendations from Cardiologist, Pulmonologist or PCP regarding          stopping Aspirin, Coumadin, Pllavix ,Eliquis, Effient, or Pradaxa, and Pletal.             STOP ASPIRIN 01/02/17  X____Stop Anti-inflammatories such as Advil, Aleve, Ibuprofen, Motrin, Naproxen, Naprosyn, Goodies powders or aspirin products. OK to take Tylenol   _   __ Stop supplements until after surgery.  But may continue Vitamin D, Vitamin B,       and multivitamin.   ____ Margretta Sidle  C-Pap to the hospital.

## 2016-12-31 DIAGNOSIS — H401131 Primary open-angle glaucoma, bilateral, mild stage: Secondary | ICD-10-CM | POA: Diagnosis not present

## 2017-01-08 MED ORDER — CEFAZOLIN SODIUM-DEXTROSE 2-4 GM/100ML-% IV SOLN
2.0000 g | INTRAVENOUS | Status: AC
  Administered 2017-01-09: 2 g via INTRAVENOUS

## 2017-01-09 ENCOUNTER — Encounter
Admission: RE | Disposition: A | Discharge status: Home / Self Care | Payer: Self-pay | Source: Ambulatory Visit | Attending: Urology

## 2017-01-09 ENCOUNTER — Ambulatory Visit
Admission: RE | Admit: 2017-01-09 | Discharge: 2017-01-09 | Disposition: A | Discharge status: Home / Self Care | Payer: Medicare HMO | Source: Ambulatory Visit | Attending: Urology | Admitting: Urology

## 2017-01-09 ENCOUNTER — Ambulatory Visit: Payer: Medicare HMO | Admitting: Anesthesiology

## 2017-01-09 ENCOUNTER — Telehealth: Payer: Self-pay | Admitting: Urology

## 2017-01-09 ENCOUNTER — Encounter: Payer: Self-pay | Admitting: *Deleted

## 2017-01-09 DIAGNOSIS — J449 Chronic obstructive pulmonary disease, unspecified: Secondary | ICD-10-CM | POA: Diagnosis not present

## 2017-01-09 DIAGNOSIS — C679 Malignant neoplasm of bladder, unspecified: Secondary | ICD-10-CM | POA: Diagnosis not present

## 2017-01-09 DIAGNOSIS — C673 Malignant neoplasm of anterior wall of bladder: Secondary | ICD-10-CM | POA: Insufficient documentation

## 2017-01-09 DIAGNOSIS — Z87891 Personal history of nicotine dependence: Secondary | ICD-10-CM | POA: Insufficient documentation

## 2017-01-09 DIAGNOSIS — I10 Essential (primary) hypertension: Secondary | ICD-10-CM | POA: Diagnosis not present

## 2017-01-09 DIAGNOSIS — D494 Neoplasm of unspecified behavior of bladder: Secondary | ICD-10-CM

## 2017-01-09 DIAGNOSIS — R310 Gross hematuria: Secondary | ICD-10-CM | POA: Diagnosis not present

## 2017-01-09 HISTORY — PX: TRANSURETHRAL RESECTION OF BLADDER TUMOR: SHX2575

## 2017-01-09 SURGERY — TURBT (TRANSURETHRAL RESECTION OF BLADDER TUMOR)
Anesthesia: General

## 2017-01-09 MED ORDER — MIDAZOLAM HCL 2 MG/2ML IJ SOLN
INTRAMUSCULAR | Status: AC
  Filled 2017-01-09: qty 2

## 2017-01-09 MED ORDER — BELLADONNA ALKALOIDS-OPIUM 16.2-60 MG RE SUPP
RECTAL | Status: AC
  Filled 2017-01-09: qty 1

## 2017-01-09 MED ORDER — PROPOFOL 10 MG/ML IV BOLUS
INTRAVENOUS | Status: AC
  Filled 2017-01-09: qty 20

## 2017-01-09 MED ORDER — DEXMEDETOMIDINE HCL IN NACL 200 MCG/50ML IV SOLN
INTRAVENOUS | Status: AC
  Filled 2017-01-09: qty 50

## 2017-01-09 MED ORDER — ONDANSETRON HCL 4 MG/2ML IJ SOLN
INTRAMUSCULAR | Status: DC | PRN
  Administered 2017-01-09: 4 mg via INTRAVENOUS

## 2017-01-09 MED ORDER — FENTANYL CITRATE (PF) 100 MCG/2ML IJ SOLN
INTRAMUSCULAR | Status: AC
  Filled 2017-01-09: qty 2

## 2017-01-09 MED ORDER — FAMOTIDINE 20 MG PO TABS
20.0000 mg | ORAL_TABLET | Freq: Once | ORAL | Status: AC
  Administered 2017-01-09: 20 mg via ORAL

## 2017-01-09 MED ORDER — CEPHALEXIN 500 MG PO CAPS: 500.0000 mg | ORAL_CAPSULE | Freq: Three times a day (TID) | ORAL | 0 refills | Status: DC

## 2017-01-09 MED ORDER — FENTANYL CITRATE (PF) 100 MCG/2ML IJ SOLN
INTRAMUSCULAR | Status: AC
  Administered 2017-01-09: 25 ug via INTRAVENOUS
  Filled 2017-01-09: qty 2

## 2017-01-09 MED ORDER — ONDANSETRON HCL 4 MG/2ML IJ SOLN: 4.0000 mg | Freq: Once | INTRAMUSCULAR | Status: DC | PRN

## 2017-01-09 MED ORDER — SODIUM BICARBONATE 8.4 % IV SOLN
INTRAVENOUS | Status: AC
  Filled 2017-01-09: qty 50

## 2017-01-09 MED ORDER — OXYCODONE HCL 5 MG PO TABS
ORAL_TABLET | ORAL | Status: AC
  Filled 2017-01-09: qty 1

## 2017-01-09 MED ORDER — LIDOCAINE HCL 2 % EX GEL
CUTANEOUS | Status: AC
  Filled 2017-01-09: qty 5

## 2017-01-09 MED ORDER — OXYCODONE HCL 5 MG/5ML PO SOLN: 5.0000 mg | Freq: Once | ORAL | Status: AC | PRN

## 2017-01-09 MED ORDER — FENTANYL CITRATE (PF) 100 MCG/2ML IJ SOLN
25.0000 ug | INTRAMUSCULAR | Status: DC | PRN
  Administered 2017-01-09 (×4): 25 ug via INTRAVENOUS

## 2017-01-09 MED ORDER — FENTANYL CITRATE (PF) 100 MCG/2ML IJ SOLN
INTRAMUSCULAR | Status: DC | PRN
  Administered 2017-01-09 (×4): 25 ug via INTRAVENOUS
  Administered 2017-01-09 (×2): 50 ug via INTRAVENOUS
  Administered 2017-01-09 (×4): 25 ug via INTRAVENOUS

## 2017-01-09 MED ORDER — FAMOTIDINE 20 MG PO TABS
ORAL_TABLET | ORAL | Status: DC
Start: 2017-01-09 — End: 2017-01-09
  Filled 2017-01-09: qty 1

## 2017-01-09 MED ORDER — CEFAZOLIN SODIUM-DEXTROSE 2-4 GM/100ML-% IV SOLN
INTRAVENOUS | Status: AC
  Filled 2017-01-09: qty 100

## 2017-01-09 MED ORDER — HYDROCODONE-ACETAMINOPHEN 5-325 MG PO TABS: 1.0000 | ORAL_TABLET | Freq: Four times a day (QID) | ORAL | 0 refills | Status: DC | PRN

## 2017-01-09 MED ORDER — LIDOCAINE HCL (CARDIAC) 20 MG/ML IV SOLN
INTRAVENOUS | Status: DC | PRN
  Administered 2017-01-09: 80 mg via INTRAVENOUS

## 2017-01-09 MED ORDER — PROPOFOL 10 MG/ML IV BOLUS
INTRAVENOUS | Status: DC | PRN
  Administered 2017-01-09: 170 mg via INTRAVENOUS

## 2017-01-09 MED ORDER — BELLADONNA ALKALOIDS-OPIUM 16.2-60 MG RE SUPP
1.0000 | Freq: Every day | RECTAL | Status: DC
  Administered 2017-01-09: 1 via RECTAL

## 2017-01-09 MED ORDER — DEXMEDETOMIDINE HCL 200 MCG/2ML IV SOLN
INTRAVENOUS | Status: DC | PRN
  Administered 2017-01-09 (×3): 4 ug via INTRAVENOUS

## 2017-01-09 MED ORDER — MIDAZOLAM HCL 2 MG/2ML IJ SOLN
INTRAMUSCULAR | Status: DC | PRN
  Administered 2017-01-09 (×2): 2 mg via INTRAVENOUS

## 2017-01-09 MED ORDER — PHENYLEPHRINE HCL 10 MG/ML IJ SOLN
INTRAMUSCULAR | Status: DC | PRN
  Administered 2017-01-09: 100 ug via INTRAVENOUS

## 2017-01-09 MED ORDER — OXYCODONE HCL 5 MG PO TABS
5.0000 mg | ORAL_TABLET | Freq: Once | ORAL | Status: AC | PRN
  Administered 2017-01-09: 5 mg via ORAL

## 2017-01-09 MED ORDER — LACTATED RINGERS IV SOLN
INTRAVENOUS | Status: DC
  Administered 2017-01-09: 08:00:00 via INTRAVENOUS

## 2017-01-09 SURGICAL SUPPLY — 31 items
BACTOSHIELD CHG 4% 4OZ (MISCELLANEOUS) ×2
BAG DRAIN CYSTO-URO LG1000N (MISCELLANEOUS) ×3 IMPLANT
BAG URO DRAIN 2000ML W/SPOUT (MISCELLANEOUS) ×2 IMPLANT
CATH COUDE FOLEY 2W 5CC 20FR (CATHETERS) ×2 IMPLANT
CATH FOL LEG HOLDER (MISCELLANEOUS) IMPLANT
CATH FOLEY 2WAY  5CC 16FR (CATHETERS)
CATH FOLEY 2WAY 5CC 16FR (CATHETERS)
CATH FOLEY 3WAY 30CC 24FR (CATHETERS)
CATH URTH 16FR FL 2W BLN LF (CATHETERS) IMPLANT
CATH URTH STD 24FR FL 3W 2 (CATHETERS) IMPLANT
DRAPE INCISE 23X17 IOBAN STRL (DRAPES)
DRAPE INCISE 23X17 STRL (DRAPES) IMPLANT
DRAPE INCISE IOBAN 23X17 STRL (DRAPES) IMPLANT
ELECT LOOP 22F BIPOLAR SML (ELECTROSURGICAL) ×3
ELECT REM PT RETURN 9FT ADLT (ELECTROSURGICAL) ×3
ELECTRODE LOOP 22F BIPOLAR SML (ELECTROSURGICAL) ×1 IMPLANT
ELECTRODE REM PT RTRN 9FT ADLT (ELECTROSURGICAL) ×1 IMPLANT
EVACUATOR ELLICK (MISCELLANEOUS) ×1 IMPLANT
GLOVE BIO SURGEON STRL SZ7.5 (GLOVE) ×3 IMPLANT
GOWN STRL REUS W/ TWL LRG LVL3 (GOWN DISPOSABLE) ×2 IMPLANT
GOWN STRL REUS W/TWL LRG LVL3 (GOWN DISPOSABLE) ×6
KIT RM TURNOVER CYSTO AR (KITS) ×3 IMPLANT
LOOP CUT BIPOLAR 24F LRG (ELECTROSURGICAL) IMPLANT
PACK CYSTO AR (MISCELLANEOUS) ×3 IMPLANT
SCRUB CHG 4% DYNA-HEX 4OZ (MISCELLANEOUS) ×1 IMPLANT
SET IRRIG Y TYPE TUR BLADDER L (SET/KITS/TRAYS/PACK) ×3 IMPLANT
SOL .9 NS 3000ML IRR  AL (IV SOLUTION) ×10
SOL .9 NS 3000ML IRR AL (IV SOLUTION) ×5
SOL .9 NS 3000ML IRR UROMATIC (IV SOLUTION) ×2 IMPLANT
SURGILUBE 2OZ TUBE FLIPTOP (MISCELLANEOUS) ×3 IMPLANT
WATER STERILE IRR 1000ML POUR (IV SOLUTION) ×3 IMPLANT

## 2017-01-09 NOTE — Op Note (Signed)
Date of procedure: 01/09/17  Preoperative diagnosis:  1. Bladder tumor  2. Gross hematuria  Postoperative diagnosis:  1. Bladder tumor 2. Gross hematuria   Procedure: 1. Transurethral resection of bladder tumor-3.5 cm 2. Fulguration of prosthetic bleeders  Surgeon: Baruch Gouty, MD  Anesthesia: General  Complications: None  Intraoperative findings: The patient had a 3 cm tumor on the right anterior bladder wall. Resection was very difficult due to the location as well as the patient's high bladder neck making access to this portion of bladder very difficult. It was resected in entirety to muscle. Due to the torque on the cystoscope needed to reach the tumor, he had oozing from his prostate that was fulgurated and necessitated a catheter postoperatively.  EBL: None  Specimens: Bladder tumor to pathology  Drains: 20 French Coude catheter to dependent drainage  Disposition: Stable to the postanesthesia care unit  Indication for procedure: The patient is a 76 y.o. male with history of gross hematuria found to have a 3 cm tumor at the right anterior wall of the bladder presents for definitive management.  After reviewing the management options for treatment, the patient elected to proceed with the above surgical procedure(s). We have discussed the potential benefits and risks of the procedure, side effects of the proposed treatment, the likelihood of the patient achieving the goals of the procedure, and any potential problems that might occur during the procedure or recuperation. Informed consent has been obtained.  Description of procedure: The patient was met in the preoperative area. All risks, benefits, and indications of the procedure were described in great detail. The patient consented to the procedure. Preoperative antibiotics were given. The patient was taken to the operative theater. General anesthesia was induced per the anesthesia service. The patient was then placed in the  dorsal lithotomy position and prepped and draped in the usual sterile fashion. A preoperative timeout was called.   A 24 French 30 cystoscope was inserted into the patient's bladder per urethra. This was quite difficult due to the patient's very high bladder neck but made it difficult to navigate the cystoscope and the prostatic urethra. This was done with difficulty. The patient had a hypervascular prostate that be in 2 views due to the difficult access. These areas were for repair. The tumor was then found and Pan cystoscopy to be in the right anterior wall of the bladder. It was very difficult to access this area. Pressure had to be placed on the anterior abdominal wall as well as the resectoscope had to be torqued anteriorly due to the patient's high bladder neck and large prostate. With great care, was able to resect the tumor in its entirety down to muscle. It was 3 cm in size. hemostasis was obtained with excellent. bladder tumor chips were evacuated and sent to pathology. further oozing within the prosthetic urethra was controlled until it was minimal. due to this oozing, it was deemed necessary to leave a foley catheter. after hemostasis was obtained, this resectoscope was removed. a 20 french coud catheter was placed. this irrigated easily with pink urine and no clots. the patient was awoke from anesthesia and transferred in stable condition to the post anesthesia care unit.  Plan: The patient return in 3 days to have his Foley catheter removed by the nursing staff. He'll follow-up in one week to discuss pathology results.   Baruch Gouty, M.D.

## 2017-01-09 NOTE — H&P (Signed)
01/09/17 8:25 AM   Matthew Carter [birth year] [id number]  CC: Bladder tumor  HPI: The patient is a 76 year old gentleman presents today for completion of his gross hematuria workup. 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 There were no other abnormalities noted on the CT scan. He presents today for cystoscopy.     PMH: Past Medical History:  Diagnosis Date  . Adenomatous polyps   . Allergy   . ED (erectile dysfunction)   . Glaucoma   . Glucose intolerance (impaired glucose tolerance)   . Hemorrhoids   . Hypertension     Surgical History: Past Surgical History:  Procedure Laterality Date  . NO PAST SURGERIES      Home Medications: Reviewed   Allergies: No Known Allergies  Family History: Family History  Problem Relation Age of Onset  . GER disease Mother   . Hypertension Mother   . Kidney failure Mother   . Stroke Father   . Hypertension Father   . Cancer Paternal Grandfather        Leukemia  . Diabetes Paternal Grandfather   . Cancer Brother        throat cancer  . Colon cancer Neg Hx     Social History:  reports that he quit smoking about 35 years ago. He has never used smokeless tobacco. He reports that he does not drink alcohol or use drugs.  ROS: 12 point ROS negative except for above.  Physical Exam: BP (!) 174/73   Pulse 72   Temp 98.1 F (36.7 C) (Oral)   Resp 20   SpO2 99%   Constitutional:  Alert and oriented, No acute distress. HEENT: [place] AT, moist mucus membranes.  Trachea midline, no masses. Cardiovascular: No clubbing, cyanosis, or edema. Respiratory: Normal respiratory effort, no increased work of breathing. GI: Abdomen is soft, nontender, nondistended, no abdominal masses GU: No CVA tenderness.  Skin: No rashes, bruises or suspicious lesions. Lymph: No cervical or inguinal adenopathy. Neurologic: Grossly intact, no focal deficits, moving all 4  extremities. Psychiatric: Normal mood and affect.  Laboratory Data: Lab Results  Component Value Date   WBC 4.5 12/30/2016   HGB 11.8 (L) 12/30/2016   HCT 35.0 (L) 12/30/2016   MCV 84.0 12/30/2016   PLT 236 12/30/2016    Lab Results  Component Value Date   CREATININE 1.15 12/30/2016    Lab Results  Component Value Date   PSA 2.97 11/14/2011   PSA 2.96 08/21/2009   PSA 1.92 08/18/2007    No results found for: TESTOSTERONE  Lab Results  Component Value Date   HGBA1C 6.3 09/06/2015    Urinalysis    Component Value Date/Time   COLORURINE YELLOW 08/11/2016 0820   APPEARANCEUR Clear 12/18/2016 0818   LABSPEC 1.015 08/11/2016 0820   PHURINE 6.0 08/11/2016 0820   GLUCOSEU Negative 12/18/2016 0818   GLUCOSEU NEGATIVE 08/11/2016 0820   HGBUR SMALL (A) 08/11/2016 0820   BILIRUBINUR Negative 12/18/2016 0818   KETONESUR NEGATIVE 08/11/2016 0820   PROTEINUR Negative 12/18/2016 0818   UROBILINOGEN 0.2 11/12/2016 0929   UROBILINOGEN 0.2 08/11/2016 0820   NITRITE Negative 12/18/2016 0818   NITRITE NEGATIVE 08/11/2016 0820   LEUKOCYTESUR Trace (A) 12/18/2016 0818      Assessment & Plan:    1. Bladder tumor 2. Gross hematuria I discussed the patient that he has a bladder tumor that is considered TCC of the bladder until proven  otherwise. We discussed the next step is transurethral resection of bladder tumor. We discussed the risks, benefits, indications of this procedure. He understands the risks include but are not limited to bleeding, infection, need for repeat procedure, iatrogenic injury, and need for Foley catheter. All questions were answered.   Nickie Retort, MD  [facility] [address] [place], [place] [address] [phone]

## 2017-01-09 NOTE — Anesthesia Preprocedure Evaluation (Signed)
Anesthesia Evaluation  Patient identified by MRN, date of birth, ID band Patient awake    Reviewed: Allergy & Precautions, NPO status , Patient's Chart, lab work & pertinent test results  Airway Mallampati: III       Dental  (+) Upper Dentures, Lower Dentures   Pulmonary COPD, former smoker,     + decreased breath sounds      Cardiovascular Exercise Tolerance: Good hypertension, Pt. on medications  Rhythm:Regular     Neuro/Psych negative neurological ROS     GI/Hepatic negative GI ROS, Neg liver ROS,   Endo/Other  negative endocrine ROS  Renal/GU negative Renal ROS     Musculoskeletal   Abdominal Normal abdominal exam  (+)   Peds negative pediatric ROS (+)  Hematology negative hematology ROS (+)   Anesthesia Other Findings   Reproductive/Obstetrics                             Anesthesia Physical Anesthesia Plan  ASA: III  Anesthesia Plan: General   Post-op Pain Management:    Induction: Intravenous  PONV Risk Score and Plan: 0  Airway Management Planned: LMA  Additional Equipment:   Intra-op Plan:   Post-operative Plan: Extubation in OR  Informed Consent: I have reviewed the patients History and Physical, chart, labs and discussed the procedure including the risks, benefits and alternatives for the proposed anesthesia with the patient or authorized representative who has indicated his/her understanding and acceptance.     Plan Discussed with: CRNA  Anesthesia Plan Comments:         Anesthesia Quick Evaluation

## 2017-01-09 NOTE — Telephone Encounter (Signed)
-----   Message from Nickie Retort, MD sent at 01/09/2017 10:33 AM EDT ----- Patient needs to see nurses in AM Monday for foley removal. He needs to see me in one week for pathology. Please overbook him before lunch. Thanks.

## 2017-01-09 NOTE — Anesthesia Post-op Follow-up Note (Cosign Needed)
Anesthesia QCDR form completed.        

## 2017-01-09 NOTE — Telephone Encounter (Signed)
done

## 2017-01-09 NOTE — Transfer of Care (Signed)
Immediate Anesthesia Transfer of Care Note  Patient: Matthew Carter  Procedure(s) Performed: Procedure(s): TRANSURETHRAL RESECTION OF BLADDER TUMOR (TURBT)-(MEDIUM) (N/A)  Patient Location: PACU  Anesthesia Type:General  Level of Consciousness: awake  Airway & Oxygen Therapy: Patient Spontanous Breathing and Patient connected to face mask oxygen  Post-op Assessment: Report given to RN and Post -op Vital signs reviewed and stable  Post vital signs: Reviewed and stable  Last Vitals:  Vitals:   01/09/17 0757  BP: (!) 174/73  Pulse: 72  Resp: 20  Temp: 36.7 C    Last Pain:  Vitals:   01/09/17 0757  TempSrc: Oral         Complications: No apparent anesthesia complications

## 2017-01-09 NOTE — Anesthesia Postprocedure Evaluation (Signed)
Anesthesia Post Note  Patient: Matthew Carter  Procedure(s) Performed: Procedure(s) (LRB): TRANSURETHRAL RESECTION OF BLADDER TUMOR (TURBT)-(MEDIUM) (N/A)  Patient location during evaluation: PACU Anesthesia Type: General Level of consciousness: awake Pain management: pain level not controlled (Patient experienced significant pain postop, had to be treated with iv Fentanyl, versed and precedex) Vital Signs Assessment: post-procedure vital signs reviewed and stable Respiratory status: spontaneous breathing Cardiovascular status: stable Anesthetic complications: yes     Last Vitals:  Vitals:   01/09/17 1130 01/09/17 1134  BP:  (!) 144/72  Pulse: 71 70  Resp: 15 10  Temp:  36.3 C    Last Pain:  Vitals:   01/09/17 1134  TempSrc:   PainSc: Asleep                 VAN STAVEREN,Sharai [place]

## 2017-01-09 NOTE — Anesthesia Procedure Notes (Signed)
Procedure Name: LMA Insertion Date/Time: 01/09/2017 8:56 AM Performed by: Doreen Salvage Pre-anesthesia Checklist: Patient identified, Patient being monitored, Timeout performed, Emergency Drugs available and Suction available Patient Re-evaluated:Patient Re-evaluated prior to inductionOxygen Delivery Method: Circle system utilized Preoxygenation: Pre-oxygenation with 100% oxygen Intubation Type: IV induction Ventilation: Mask ventilation without difficulty LMA: LMA inserted LMA Size: 5.0 Tube type: Oral Number of attempts: 1 Placement Confirmation: positive ETCO2 and breath sounds checked- equal and bilateral Tube secured with: Tape Dental Injury: Teeth and Oropharynx as per pre-operative assessment

## 2017-01-09 NOTE — Discharge Instructions (Addendum)

## 2017-01-11 ENCOUNTER — Encounter: Payer: Self-pay | Admitting: Urology

## 2017-01-12 ENCOUNTER — Ambulatory Visit (INDEPENDENT_AMBULATORY_CARE_PROVIDER_SITE_OTHER): Payer: Medicare HMO

## 2017-01-12 VITALS — BP 144/77 | HR 99 | Ht 70.5 in | Wt 233.2 lb

## 2017-01-12 DIAGNOSIS — R310 Gross hematuria: Secondary | ICD-10-CM | POA: Diagnosis not present

## 2017-01-12 NOTE — Progress Notes (Signed)
Catheter Removal  Patient is present today for a catheter removal.  31ml of water was drained from the balloon. A  16FR foley cath was removed from the bladder no complications were noted . Patient tolerated well.  Preformed by: C. Corinna Capra, CMA   Follow up/ Additional notes: Drink fluids. Return @ 3pm today for bladder scan.

## 2017-01-13 LAB — SURGICAL PATHOLOGY

## 2017-01-16 ENCOUNTER — Encounter: Payer: Self-pay | Admitting: Urology

## 2017-01-16 ENCOUNTER — Encounter: Payer: Medicare HMO | Admitting: Urology

## 2017-01-22 ENCOUNTER — Encounter: Payer: Self-pay | Admitting: Urology

## 2017-01-22 ENCOUNTER — Ambulatory Visit (INDEPENDENT_AMBULATORY_CARE_PROVIDER_SITE_OTHER): Payer: Medicare HMO | Admitting: Urology

## 2017-01-22 VITALS — BP 163/76 | HR 83 | Ht 71.0 in | Wt 233.0 lb

## 2017-01-22 DIAGNOSIS — C678 Malignant neoplasm of overlapping sites of bladder: Secondary | ICD-10-CM | POA: Diagnosis not present

## 2017-01-22 NOTE — Progress Notes (Signed)
This encounter was created in error - please disregard.

## 2017-01-22 NOTE — Progress Notes (Signed)
01/22/2017 2:40 PM   Matthew Carter 01/06/1941 [id number]  Referring provider: Venia Carbon, MD [address] [place], [place] [address]  Chief Complaint  Patient presents with  . Results    post op    HPI: The patient is a 76 year old gentleman who recently underwent TURBT with 3.5 cm right anterior bladder wall tumor. There is focal lamina propria invasion only. Muscularis propria was present negative. 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  This tumor was originally found as part of a gross hematuria workup that was otherwise negative.   PMH: Past Medical History:  Diagnosis Date  . Adenomatous polyps   . Allergy   . ED (erectile dysfunction)   . Glaucoma   . Glucose intolerance (impaired glucose tolerance)   . Hemorrhoids   . Hypertension     Surgical History: Past Surgical History:  Procedure Laterality Date  . NO PAST SURGERIES    . TRANSURETHRAL RESECTION OF BLADDER TUMOR N/A 01/09/2017   Procedure: TRANSURETHRAL RESECTION OF BLADDER TUMOR (TURBT)-(MEDIUM);  Surgeon: Nickie Retort, MD;  Location: [facility] ORS;  Service: Urology;  Laterality: N/A;    Home Medications:  Allergies as of 01/22/2017   No Known Allergies     Medication List       Accurate as of 01/22/17  2:40 PM. Always use your most recent med list.          amLODipine 5 MG tablet Commonly known as:  NORVASC TAKE 1 TABLET EVERY DAY FOR BLOOD PRESSURE   aspirin 81 MG tablet Take 81 mg by mouth daily.   latanoprost 0.005 % ophthalmic solution Commonly known as:  XALATAN Place 1 drop into both eyes at bedtime.   loratadine 10 MG tablet Commonly known as:  CLARITIN Take 10 mg by mouth daily as needed for allergies.   timolol 0.5 % ophthalmic solution Commonly known as:  TIMOPTIC Place 1 drop into both eyes daily.       Allergies: No Known Allergies  Family History: Family History  Problem Relation Age of Onset  . GER disease Mother   .  Hypertension Mother   . Kidney failure Mother   . Stroke Father   . Hypertension Father   . Cancer Paternal Grandfather        Leukemia  . Diabetes Paternal Grandfather   . Cancer Brother        throat cancer  . Colon cancer Neg Hx     Social History:  reports that he quit smoking about 35 years ago. He has never used smokeless tobacco. He reports that he does not drink alcohol or use drugs.  ROS: UROLOGY Frequent Urination?: No Hard to postpone urination?: No Burning/pain with urination?: Yes Get up at night to urinate?: Yes Leakage of urine?: No Urine stream starts and stops?: No Trouble starting stream?: No Do you have to strain to urinate?: No Blood in urine?: No Urinary tract infection?: No Sexually transmitted disease?: No Injury to kidneys or bladder?: No Painful intercourse?: No Weak stream?: No Erection problems?: No Penile pain?: No  Gastrointestinal Nausea?: No Vomiting?: No Indigestion/heartburn?: No Diarrhea?: No Constipation?: No  Constitutional Fever: No Night sweats?: No Weight loss?: No Fatigue?: No  Skin Skin rash/lesions?: No Itching?: No  Eyes Blurred vision?: No Double vision?: No  Ears/Nose/Throat Sore throat?: No Sinus problems?: No  Hematologic/Lymphatic Swollen glands?: No Easy bruising?: No  Cardiovascular Leg swelling?: No Chest pain?: No  Respiratory Cough?: No  Shortness of breath?: No  Endocrine Excessive thirst?: No  Musculoskeletal Back pain?: No Joint pain?: No  Neurological Headaches?: No Dizziness?: No  Psychologic Depression?: No Anxiety?: No  Physical Exam: BP (!) 163/76   Pulse 83   Ht 5\' 11"  (1.803 m)   Wt 233 lb (105.7 kg)   BMI 32.50 kg/m   Constitutional:  Alert and oriented, No acute distress. HEENT: [place] AT, moist mucus membranes.  Trachea midline, no masses. Cardiovascular: No clubbing, cyanosis, or edema. Respiratory: Normal respiratory effort, no increased work of breathing. GI:  Abdomen is soft, nontender, nondistended, no abdominal masses GU: No CVA tenderness.  Skin: No rashes, bruises or suspicious lesions. Lymph: No cervical or inguinal adenopathy. Neurologic: Grossly intact, no focal deficits, moving all 4 extremities. Psychiatric: Normal mood and affect.  Laboratory Data: Lab Results  Component Value Date   WBC 4.5 12/30/2016   HGB 11.8 (L) 12/30/2016   HCT 35.0 (L) 12/30/2016   MCV 84.0 12/30/2016   PLT 236 12/30/2016    Lab Results  Component Value Date   CREATININE 1.15 12/30/2016    Lab Results  Component Value Date   PSA 2.97 11/14/2011   PSA 2.96 08/21/2009   PSA 1.92 08/18/2007    No results found for: TESTOSTERONE  Lab Results  Component Value Date   HGBA1C 6.3 09/06/2015    Urinalysis    Component Value Date/Time   COLORURINE YELLOW 08/11/2016 0820   APPEARANCEUR Clear 12/18/2016 0818   LABSPEC 1.015 08/11/2016 0820   PHURINE 6.0 08/11/2016 0820   GLUCOSEU Negative 12/18/2016 0818   GLUCOSEU NEGATIVE 08/11/2016 0820   HGBUR SMALL (A) 08/11/2016 0820   BILIRUBINUR Negative 12/18/2016 0818   KETONESUR NEGATIVE 08/11/2016 0820   PROTEINUR Negative 12/18/2016 0818   UROBILINOGEN 0.2 11/12/2016 0929   UROBILINOGEN 0.2 08/11/2016 0820   NITRITE Negative 12/18/2016 0818   NITRITE NEGATIVE 08/11/2016 0820   LEUKOCYTESUR Trace (A) 12/18/2016 0818    Assessment & Plan:    1. 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 We discussed the standard of care for this would be to repeat a TURBT. However, we did discuss the very focal invasion as well as the negative muscle in the specimen. We also discussed the location on the anterior wall near the dome that puts him at risk for perforation from repeat resection. We discussed either skipping repeat resection versus proceeding. The family has elected  to not undergo repeat resection at this time. We will arrange for him to undergo BCG in 6 weeks. We discussed the risks, benefits, indications of this. He understands the risks include dysuria, feeling of UTI, suprapubic discomfort, need to bleach the toilet after first void, and risks for BCG sepsis. He is agreeable to proceeding. He will follow-up in 6 weeks for a 6 week induction course of BCG. He will be set up for cystoscopy 3 months after completing this.   Nickie Retort, MD  [facility] [address] [place], [place] [address] [phone]

## 2017-01-30 ENCOUNTER — Telehealth: Payer: Self-pay

## 2017-01-30 ENCOUNTER — Encounter: Payer: Self-pay | Admitting: *Deleted

## 2017-01-30 ENCOUNTER — Other Ambulatory Visit: Payer: Self-pay | Admitting: Radiology

## 2017-01-30 NOTE — Telephone Encounter (Signed)
I would stop it for now- please let patient know.   I routed this to PCP for input in the long run and to consider hematuria w/u.  Thanks.

## 2017-01-30 NOTE — Telephone Encounter (Signed)
Pt left voice mail states that he was told to take aspirin but every time he takes it has hematuria, he wants to know if he needs to keep taking the aspirin? He said the last time he took was Monday and he hasn't seen any blood since. He said that you can send him a message through [place].

## 2017-01-30 NOTE — Telephone Encounter (Signed)
Sent MyChart message and left message on VM at home.

## 2017-01-31 NOTE — Telephone Encounter (Signed)
He just had a bladder tumor resected so this is not surprising. He may be able to tolerate aspirin at another time, after he has healed up---but for now, have him stay off it

## 2017-02-12 ENCOUNTER — Other Ambulatory Visit: Payer: Self-pay | Admitting: Internal Medicine

## 2017-03-04 DIAGNOSIS — C679 Malignant neoplasm of bladder, unspecified: Secondary | ICD-10-CM | POA: Insufficient documentation

## 2017-03-04 DIAGNOSIS — C673 Malignant neoplasm of anterior wall of bladder: Principal | ICD-10-CM

## 2017-03-04 DIAGNOSIS — Z8551 Personal history of malignant neoplasm of bladder: Secondary | ICD-10-CM | POA: Insufficient documentation

## 2017-03-04 NOTE — Progress Notes (Signed)
03/05/2017 1:27 PM   Matthew Carter January 15, 1941 [id number]  Referring provider: Venia Carbon, MD [address] [place], [place] [address]  Chief Complaint  Patient presents with  . Bladder Cancer    BCG 1      HPI: 76 yo AAM with bladder cancer who presents to begin an induction course of BCG.  This will be # 1/6.    Background history The patient is a 76 year old gentleman who recently underwent TURBT with 3.5 cm right anterior bladder wall tumor. There is focal lamina propria invasion only. Muscularis propria was present negative. 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  This tumor was originally found as part of a gross hematuria workup that was otherwise negative.  Today, urgency and nocturia.  He denies dysuria, gross hematuria and suprapubic pain.  He denies fevers, chills, nausea or vomiting.  His UA today was unremarkable.      PMH: Past Medical History:  Diagnosis Date  . Adenomatous polyps   . Allergy   . ED (erectile dysfunction)   . Glaucoma   . Glucose intolerance (impaired glucose tolerance)   . Hemorrhoids   . Hypertension     Surgical History: Past Surgical History:  Procedure Laterality Date  . TRANSURETHRAL RESECTION OF BLADDER TUMOR N/A 01/09/2017   Procedure: TRANSURETHRAL RESECTION OF BLADDER TUMOR (TURBT)-(MEDIUM);  Surgeon: Nickie Retort, MD;  Location: [facility] ORS;  Service: Urology;  Laterality: N/A;    Home Medications:  Allergies as of 03/05/2017   No Known Allergies     Medication List       Accurate as of 03/05/17  1:27 PM. Always use your most recent med list.          amLODipine 5 MG tablet Commonly known as:  NORVASC TAKE 1 TABLET EVERY DAY FOR BLOOD PRESSURE   aspirin 81 MG tablet Take 81 mg by mouth daily.   latanoprost 0.005 % ophthalmic solution Commonly known as:  XALATAN Place 1 drop into both eyes at bedtime.   loratadine 10 MG tablet Commonly known as:  CLARITIN Take 10 mg by  mouth daily as needed for allergies.   timolol 0.5 % ophthalmic solution Commonly known as:  TIMOPTIC Place 1 drop into both eyes daily.       Allergies: No Known Allergies  Family History: Family History  Problem Relation Age of Onset  . GER disease Mother   . Hypertension Mother   . Kidney failure Mother   . Stroke Father   . Hypertension Father   . Cancer Paternal Grandfather        Leukemia  . Diabetes Paternal Grandfather   . Cancer Brother        throat cancer  . Colon cancer Neg Hx   . Prostate cancer Neg Hx   . Bladder Cancer Neg Hx     Social History:  reports that he quit smoking about 35 years ago. He has never used smokeless tobacco. He reports that he does not drink alcohol or use drugs.  ROS: UROLOGY Frequent Urination?: No Hard to postpone urination?: Yes Burning/pain with urination?: No Get up at night to urinate?: Yes Leakage of urine?: No Urine stream starts and stops?: No Trouble starting stream?: No Do you have to strain to urinate?: No Blood in urine?: No Urinary tract infection?: No Sexually transmitted disease?: No Injury to kidneys or bladder?: No Painful intercourse?: No Weak stream?: No Erection problems?: No Penile pain?:  No  Gastrointestinal Nausea?: No Vomiting?: No Indigestion/heartburn?: No Diarrhea?: No Constipation?: No  Constitutional Fever: No Night sweats?: No Weight loss?: No Fatigue?: No  Skin Skin rash/lesions?: No Itching?: No  Eyes Blurred vision?: No Double vision?: No  Ears/Nose/Throat Sore throat?: No Sinus problems?: Yes  Hematologic/Lymphatic Swollen glands?: No Easy bruising?: No  Cardiovascular Leg swelling?: No Chest pain?: No  Respiratory Cough?: No Shortness of breath?: No  Endocrine Excessive thirst?: No  Musculoskeletal Back pain?: No Joint pain?: No  Neurological Headaches?: No Dizziness?: No  Psychologic Depression?: No Anxiety?: No  Physical Exam: BP (!)  174/78   Pulse 89   Ht 5\' 11"  (1.803 m)   Wt 225 lb 9.6 oz (102.3 kg)   BMI 31.46 kg/m   Constitutional: Well nourished. Alert and oriented, No acute distress. HEENT: [place] AT, moist mucus membranes. Trachea midline, no masses. Cardiovascular: No clubbing, cyanosis, or edema. Respiratory: Normal respiratory effort, no increased work of breathing. GI: Abdomen is soft, non tender, non distended, no abdominal masses. Liver and spleen not palpable.  No hernias appreciated.  Stool sample for occult testing is not indicated.   GU: No CVA tenderness.  No bladder fullness or masses.  Patient with circumcised/uncircumcised phallus.  Foreskin easily retracted   Urethral meatus is patent.  No penile discharge. No penile lesions or rashes.  Skin: No rashes, bruises or suspicious lesions. Lymph: No cervical or inguinal adenopathy. Neurologic: Grossly intact, no focal deficits, moving all 4 extremities. Psychiatric: Normal mood and affect.  Laboratory Data: Lab Results  Component Value Date   WBC 4.5 12/30/2016   HGB 11.8 (L) 12/30/2016   HCT 35.0 (L) 12/30/2016   MCV 84.0 12/30/2016   PLT 236 12/30/2016    Lab Results  Component Value Date   CREATININE 1.15 12/30/2016    Lab Results  Component Value Date   PSA 2.97 11/14/2011   PSA 2.96 08/21/2009   PSA 1.92 08/18/2007    Lab Results  Component Value Date   AST 16 12/05/2016   Lab Results  Component Value Date   ALT 14 12/05/2016    Urinalysis Unremarkable.  See EPIC.    I have independently reviewed the labs.  Assessment & Plan:    1. Malignant neoplasm of urinary bladder,   - Reviewed BCG treatment course, possible side effects including BCG sepsis, bladder irritation, worsening of her urinary symptoms  - # 1 of 6 BCG installed today  - Patient was instructed to pour bleach down his/her toilet for the next 6 hours  -  Instructed to call the office if she should experience fevers greater than 102, chills/rigors, onset of  a new cough, night sweats or further bladder spasms or inability to urinate   - RTC in one week for # 2 BCG  - Surveillance protocol also discussed today including cystoscopy every 3 months for at least 2 years and then spread out thereafter  - Urinalysis, Complete  - bcg vaccine injection 81 mg; Instill 3.24 mLs (81 mg total) into the bladder once.  - lidocaine (XYLOCAINE) 2 % jelly 1 application; Place 1 application into the urethra once.     Return in about 1 week (around 03/12/2017) for # 2 BCG.  These notes generated with voice recognition software. I apologize for typographical errors.  Zara Council, [facility] [address], [place],  [address] [phone]

## 2017-03-05 ENCOUNTER — Encounter: Payer: Self-pay | Admitting: Urology

## 2017-03-05 ENCOUNTER — Ambulatory Visit: Payer: Medicare HMO | Admitting: Urology

## 2017-03-05 VITALS — BP 174/78 | HR 89 | Ht 71.0 in | Wt 225.6 lb

## 2017-03-05 DIAGNOSIS — C673 Malignant neoplasm of anterior wall of bladder: Secondary | ICD-10-CM

## 2017-03-05 LAB — URINALYSIS, COMPLETE
Bilirubin, UA: NEGATIVE
GLUCOSE, UA: NEGATIVE
Ketones, UA: NEGATIVE
NITRITE UA: NEGATIVE
PROTEIN UA: NEGATIVE
RBC UA: NEGATIVE
Specific Gravity, UA: 1.01 (ref 1.005–1.030)
Urobilinogen, Ur: 0.2 mg/dL (ref 0.2–1.0)
pH, UA: 7 (ref 5.0–7.5)

## 2017-03-05 LAB — MICROSCOPIC EXAMINATION
Bacteria, UA: NONE SEEN
Epithelial Cells (non renal): NONE SEEN /hpf (ref 0–10)

## 2017-03-05 MED ORDER — BCG LIVE 50 MG IS SUSR
3.2400 mL | Freq: Once | INTRAVESICAL | Status: AC
  Administered 2017-03-05: 81 mg via INTRAVESICAL

## 2017-03-05 MED ORDER — LIDOCAINE HCL 2 % EX GEL
1.0000 "application " | Freq: Once | CUTANEOUS | Status: AC
  Administered 2017-03-05: 1 via URETHRAL

## 2017-03-05 NOTE — Progress Notes (Signed)
BCG Bladder Instillation  BCG # 1  Due to Bladder Cancer patient is present today for a BCG treatment. Patient was cleaned and prepped in a sterile fashion with betadine and lidocaine 2% jelly was instilled into the urethra.  A 14FR catheter was inserted, urine return was noted 151ml, urine was yellow in color.  22ml of reconstituted BCG was instilled into the bladder. The catheter was then removed. Patient tolerated well, no complications were noted.  Preformed by: Royden Purl and Lucy PA-S  Follow up/ Additional notes: One week

## 2017-03-12 ENCOUNTER — Encounter: Payer: Self-pay | Admitting: Urology

## 2017-03-12 ENCOUNTER — Ambulatory Visit: Payer: Medicare HMO | Admitting: Urology

## 2017-03-12 VITALS — BP 147/74 | HR 81 | Ht 71.0 in | Wt 227.0 lb

## 2017-03-12 DIAGNOSIS — C679 Malignant neoplasm of bladder, unspecified: Secondary | ICD-10-CM

## 2017-03-12 LAB — URINALYSIS, COMPLETE
BILIRUBIN UA: NEGATIVE
Glucose, UA: NEGATIVE
KETONES UA: NEGATIVE
Nitrite, UA: NEGATIVE
PROTEIN UA: NEGATIVE
SPEC GRAV UA: 1.015 (ref 1.005–1.030)
Urobilinogen, Ur: 1 mg/dL (ref 0.2–1.0)
pH, UA: 7 (ref 5.0–7.5)

## 2017-03-12 LAB — MICROSCOPIC EXAMINATION
Bacteria, UA: NONE SEEN
Epithelial Cells (non renal): NONE SEEN /hpf (ref 0–10)

## 2017-03-12 MED ORDER — LIDOCAINE HCL 2 % EX GEL
1.0000 "application " | Freq: Once | CUTANEOUS | Status: AC
  Administered 2017-03-12: 1 via URETHRAL

## 2017-03-12 MED ORDER — BCG LIVE 50 MG IS SUSR
3.2400 mL | Freq: Once | INTRAVESICAL | Status: AC
  Administered 2017-03-12: 81 mg via INTRAVESICAL

## 2017-03-12 NOTE — Progress Notes (Signed)
BCG Bladder Instillation  BCG # 2  Due to Bladder Cancer patient is present today for a BCG treatment. Patient was cleaned and prepped in a sterile fashion with betadine and lidocaine 2% jelly was instilled into the urethra.  A 14FR coude catheter was inserted, urine return was noted 42ml, urine was yellow in color.  39ml of reconstituted BCG was instilled into the bladder. The catheter was then removed. Patient tolerated well, no complications were noted  Preformed by: Elberta Leatherwood, CMA  Follow up/ Additional notes: 1 week BCG #3

## 2017-03-18 NOTE — Progress Notes (Signed)
03/19/2017 2:21 PM   Matthew Carter [birth year] [id number]  Referring provider: Venia Carbon, MD [address] [place], [place] [address]  Chief Complaint  Patient presents with  . Bladder Cancer    BCG  3      HPI: 76 yo AAM with bladder cancer who presents to begin an induction course of BCG.  This will be # 3/6.    Background history The patient is a 76 year old gentleman who recently underwent TURBT with 3.5 cm right anterior bladder wall tumor. There is focal lamina propria invasion only. Muscularis propria was present negative. 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  This tumor was originally found as part of a gross hematuria workup that was otherwise negative.  Today, he has not urinary complaints.  He denies dysuria, gross hematuria and suprapubic pain.  He denies fevers, chills, nausea or vomiting.  He has not had a new cough. 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  His UA today demonstrates 6-10 WBC's    PMH: Past Medical History:  Diagnosis Date  . Adenomatous polyps   . Allergy   . ED (erectile dysfunction)   . Glaucoma   . Glucose intolerance (impaired glucose tolerance)   . Hemorrhoids   . Hypertension     Surgical History: Past Surgical History:  Procedure Laterality Date  . TRANSURETHRAL RESECTION OF BLADDER TUMOR N/A 01/09/2017   Procedure: TRANSURETHRAL RESECTION OF BLADDER TUMOR (TURBT)-(MEDIUM);  Surgeon: Nickie Retort, MD;  Location: [facility] ORS;  Service: Urology;  Laterality: N/A;    Home Medications:  Allergies as of 03/19/2017   No Known Allergies     Medication List       Accurate as of 03/19/17 11:59 PM. Always use your most recent med list.          amLODipine 5 MG tablet Commonly known as:  NORVASC TAKE 1 TABLET EVERY DAY FOR BLOOD PRESSURE   aspirin 81 MG tablet Take 81 mg by mouth daily.   latanoprost 0.005 % ophthalmic solution Commonly known as:  XALATAN Place 1 drop into both  eyes at bedtime.   loratadine 10 MG tablet Commonly known as:  CLARITIN Take 10 mg by mouth daily as needed for allergies.   timolol 0.5 % ophthalmic solution Commonly known as:  TIMOPTIC Place 1 drop into both eyes daily.            Discharge Care Instructions        Start     Ordered   03/19/17 1015  BCG LIVE 49 MG IS SUSR   Once     03/19/17 1014   03/19/17 1015  LIDOCAINE HCL 2 % EX GEL   Once     03/19/17 1014   03/19/17 0000  Urinalysis, Complete     03/19/17 0953   03/19/17 0000  Microscopic Examination     03/19/17 0000      Allergies: No Known Allergies  Family History: Family History  Problem Relation Age of Onset  . GER disease Mother   . Hypertension Mother   . Kidney failure Mother   . Stroke Father   . Hypertension Father   . Cancer Paternal Grandfather        Leukemia  . Diabetes Paternal Grandfather   . Cancer Brother        throat cancer  . Colon cancer Neg Hx   . Prostate cancer Neg Hx   .  Bladder Cancer Neg Hx     Social History:  reports that he quit smoking about 35 years ago. He has never used smokeless tobacco. He reports that he does not drink alcohol or use drugs.  ROS: UROLOGY Frequent Urination?: No Hard to postpone urination?: No Burning/pain with urination?: No Get up at night to urinate?: No Leakage of urine?: No Urine stream starts and stops?: No Trouble starting stream?: No Do you have to strain to urinate?: No Blood in urine?: No Urinary tract infection?: No Sexually transmitted disease?: No Injury to kidneys or bladder?: No Painful intercourse?: No Weak stream?: No Erection problems?: No Penile pain?: No  Gastrointestinal Nausea?: No Vomiting?: No Indigestion/heartburn?: No Diarrhea?: No Constipation?: No  Constitutional Fever: No Night sweats?: No Weight loss?: No Fatigue?: No  Skin Skin rash/lesions?: No Itching?: No  Eyes Blurred vision?: No Double vision?: No  Ears/Nose/Throat Sore  throat?: No Sinus problems?: No  Hematologic/Lymphatic Swollen glands?: No Easy bruising?: No  Cardiovascular Leg swelling?: No Chest pain?: No  Respiratory Cough?: No Shortness of breath?: No  Endocrine Excessive thirst?: No  Musculoskeletal Back pain?: No Joint pain?: No  Neurological Headaches?: No Dizziness?: No  Psychologic Depression?: No Anxiety?: No  Physical Exam: BP (!) 154/72   Pulse 83   Ht 5\' 11"  (1.803 m)   Wt 228 lb 14.4 oz (103.8 kg)   BMI 31.93 kg/m   Constitutional: Well nourished. Alert and oriented, No acute distress. HEENT: [place] AT, moist mucus membranes. Trachea midline, no masses. Cardiovascular: No clubbing, cyanosis, or edema. Respiratory: Normal respiratory effort, no increased work of breathing. GI: Abdomen is soft, non tender, non distended, no abdominal masses.  GU: No CVA tenderness.  No bladder fullness or masses.  Patient with uncircumcised phallus.  Foreskin easily retracted   Urethral meatus is patent.  No penile discharge. No penile lesions or rashes.  Skin: No rashes, bruises or suspicious lesions. Lymph: No cervical or inguinal adenopathy. Neurologic: Grossly intact, no focal deficits, moving all 4 extremities. Psychiatric: Normal mood and affect.  Laboratory Data: Lab Results  Component Value Date   WBC 4.5 12/30/2016   HGB 11.8 (L) 12/30/2016   HCT 35.0 (L) 12/30/2016   MCV 84.0 12/30/2016   PLT 236 12/30/2016    Lab Results  Component Value Date   CREATININE 1.15 12/30/2016    Lab Results  Component Value Date   PSA 2.97 11/14/2011   PSA 2.96 08/21/2009   PSA 1.92 08/18/2007    Lab Results  Component Value Date   AST 16 12/05/2016   Lab Results  Component Value Date   ALT 14 12/05/2016    Urinalysis 6-10 WBC's.   See EPIC.    I have reviewed the labs.  Assessment & Plan:    1. Malignant neoplasm of urinary bladder,   - Reviewed BCG treatment course, possible side effects including BCG  sepsis, bladder irritation, worsening of her urinary symptoms  - # 3 of 6 BCG installed today with Coude catheter  - Patient was instructed to pour bleach down his/her toilet for the next 6 hours  -  Instructed to call the office if she should experience fevers greater than 102, chills/rigors, onset of a new cough, night sweats or further bladder spasms or inability to urinate   - RTC in one week for # 4 BCG  - Surveillance protocol also discussed today including cystoscopy every 3 months for at least 2 years and then spread out thereafter  - Urinalysis, Complete  -  bcg vaccine injection 81 mg; Instill 3.24 mLs (81 mg total) into the bladder once.  - lidocaine (XYLOCAINE) 2 % jelly 1 application; Place 1 application into the urethra once.     Return in about 1 week (around 03/26/2017) for # 4 BCG.  These notes generated with voice recognition software. I apologize for typographical errors.  Zara Council, [facility] [address], [place], [place] [address] [phone]

## 2017-03-19 ENCOUNTER — Encounter: Payer: Self-pay | Admitting: Urology

## 2017-03-19 ENCOUNTER — Ambulatory Visit: Payer: Medicare HMO | Admitting: Urology

## 2017-03-19 VITALS — BP 154/72 | HR 83 | Ht 71.0 in | Wt 228.9 lb

## 2017-03-19 DIAGNOSIS — C679 Malignant neoplasm of bladder, unspecified: Secondary | ICD-10-CM

## 2017-03-19 LAB — URINALYSIS, COMPLETE
Bilirubin, UA: NEGATIVE
GLUCOSE, UA: NEGATIVE
Ketones, UA: NEGATIVE
Nitrite, UA: NEGATIVE
PROTEIN UA: NEGATIVE
Specific Gravity, UA: 1.01 (ref 1.005–1.030)
UUROB: 1 mg/dL (ref 0.2–1.0)
pH, UA: 7 (ref 5.0–7.5)

## 2017-03-19 LAB — MICROSCOPIC EXAMINATION
Bacteria, UA: NONE SEEN
Epithelial Cells (non renal): NONE SEEN /hpf (ref 0–10)

## 2017-03-19 MED ORDER — BCG LIVE 50 MG IS SUSR
3.2400 mL | Freq: Once | INTRAVESICAL | Status: AC
  Administered 2017-03-19: 81 mg via INTRAVESICAL

## 2017-03-19 MED ORDER — LIDOCAINE HCL 2 % EX GEL
1.0000 "application " | Freq: Once | CUTANEOUS | Status: AC
  Administered 2017-03-19: 1 via URETHRAL

## 2017-03-19 NOTE — Progress Notes (Signed)
BCG Bladder Instillation  BCG # 3 (need Coude)  Due to Bladder Cancer patient is present today for a BCG treatment. Patient was cleaned and prepped in a sterile fashion with betadine and lidocaine 2% jelly was instilled into the urethra.  A 14FR catheter was inserted, urine return was noted 164ml, urine was yellow in color.  7ml of reconstituted BCG was instilled into the bladder. The catheter was then removed. Patient tolerated well, no complications were noted.  Preformed by: Zara Council PA-C and Elberta Leatherwood CMA  Follow up/ Additional notes: One week

## 2017-03-25 NOTE — Progress Notes (Signed)
03/26/2017 10:11 AM   Matthew Carter May 14, 1941 [id number]  Referring provider: Venia Carbon, MD [address] [place], [place] [address]  Chief Complaint  Patient presents with  . Bladder Cancer    BCG 4     HPI: 76 yo AAM with bladder cancer who presents to begin an induction course of BCG.  This will be # 4/6.    Background history The patient is a 76 year old gentleman who recently underwent TURBT with 3.5 cm right anterior bladder wall tumor. There is focal lamina propria invasion only. Muscularis propria was present negative. 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  This tumor was originally found as part of a gross hematuria workup that was otherwise negative.  Today, he has not urinary complaints.  He denies dysuria, gross hematuria and suprapubic pain.  He denies fevers, chills, nausea or vomiting.  He has not had a new cough. 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  His UA today demonstrates 3-10 RBC's    PMH: Past Medical History:  Diagnosis Date  . Adenomatous polyps   . Allergy   . ED (erectile dysfunction)   . Glaucoma   . Glucose intolerance (impaired glucose tolerance)   . Hemorrhoids   . Hypertension     Surgical History: Past Surgical History:  Procedure Laterality Date  . TRANSURETHRAL RESECTION OF BLADDER TUMOR N/A 01/09/2017   Procedure: TRANSURETHRAL RESECTION OF BLADDER TUMOR (TURBT)-(MEDIUM);  Surgeon: Nickie Retort, MD;  Location: [facility] ORS;  Service: Urology;  Laterality: N/A;    Home Medications:  Allergies as of 03/26/2017   No Known Allergies     Medication List       Accurate as of 03/26/17 10:11 AM. Always use your most recent med list.          amLODipine 5 MG tablet Commonly known as:  NORVASC TAKE 1 TABLET EVERY DAY FOR BLOOD PRESSURE   aspirin 81 MG tablet Take 81 mg by mouth daily.   latanoprost 0.005 % ophthalmic solution Commonly known as:  XALATAN Place 1 drop into both  eyes at bedtime.   loratadine 10 MG tablet Commonly known as:  CLARITIN Take 10 mg by mouth daily as needed for allergies.   timolol 0.5 % ophthalmic solution Commonly known as:  TIMOPTIC Place 1 drop into both eyes daily.            Discharge Care Instructions        Start     Ordered   03/26/17 0000  Urinalysis, Complete     03/26/17 7782      Allergies: No Known Allergies  Family History: Family History  Problem Relation Age of Onset  . GER disease Mother   . Hypertension Mother   . Kidney failure Mother   . Stroke Father   . Hypertension Father   . Cancer Paternal Grandfather        Leukemia  . Diabetes Paternal Grandfather   . Cancer Brother        throat cancer  . Colon cancer Neg Hx   . Prostate cancer Neg Hx   . Bladder Cancer Neg Hx     Social History:  reports that he quit smoking about 35 years ago. He has never used smokeless tobacco. He reports that he does not drink alcohol or use drugs.  ROS: UROLOGY Frequent Urination?: No Hard to postpone urination?: No Burning/pain with urination?: No Get up  at night to urinate?: No Leakage of urine?: No Urine stream starts and stops?: No Trouble starting stream?: No Do you have to strain to urinate?: No Blood in urine?: No Urinary tract infection?: No Sexually transmitted disease?: No Injury to kidneys or bladder?: No Painful intercourse?: No Weak stream?: No Erection problems?: No Penile pain?: No  Gastrointestinal Nausea?: No Vomiting?: No Indigestion/heartburn?: No Diarrhea?: No Constipation?: No  Constitutional Fever: No Night sweats?: No Weight loss?: No Fatigue?: No  Skin Skin rash/lesions?: No Itching?: No  Eyes Blurred vision?: No Double vision?: No  Ears/Nose/Throat Sore throat?: No Sinus problems?: No  Hematologic/Lymphatic Swollen glands?: No Easy bruising?: No  Cardiovascular Leg swelling?: No Chest pain?: No  Respiratory Cough?: No Shortness of  breath?: No  Endocrine Excessive thirst?: No  Musculoskeletal Back pain?: No Joint pain?: No  Neurological Headaches?: No Dizziness?: No  Psychologic Depression?: No Anxiety?: No  Physical Exam: BP (!) 172/74   Pulse 84   Ht 5\' 11"  (1.803 m)   Wt 229 lb 6.4 oz (104.1 kg)   BMI 31.99 kg/m   Constitutional: Well nourished. Alert and oriented, No acute distress. HEENT: [place] AT, moist mucus membranes. Trachea midline, no masses. Cardiovascular: No clubbing, cyanosis, or edema. Respiratory: Normal respiratory effort, no increased work of breathing. GI: Abdomen is soft, non tender, non distended, no abdominal masses.  GU: No CVA tenderness.  No bladder fullness or masses.  Patient with uncircumcised phallus.  Foreskin easily retracted   Urethral meatus is patent.  No penile discharge. No penile lesions or rashes.  Skin: No rashes, bruises or suspicious lesions. Lymph: No cervical or inguinal adenopathy. Neurologic: Grossly intact, no focal deficits, moving all 4 extremities. Psychiatric: Normal mood and affect.  Laboratory Data: Lab Results  Component Value Date   WBC 4.5 12/30/2016   HGB 11.8 (L) 12/30/2016   HCT 35.0 (L) 12/30/2016   MCV 84.0 12/30/2016   PLT 236 12/30/2016    Lab Results  Component Value Date   CREATININE 1.15 12/30/2016    Lab Results  Component Value Date   PSA 2.97 11/14/2011   PSA 2.96 08/21/2009   PSA 1.92 08/18/2007    Lab Results  Component Value Date   AST 16 12/05/2016   Lab Results  Component Value Date   ALT 14 12/05/2016    Urinalysis 3-10 WBC's.   See EPIC.    I have reviewed the labs.  Assessment & Plan:    1. Malignant neoplasm of urinary bladder,   - Reviewed BCG treatment course, possible side effects including BCG sepsis, bladder irritation, worsening of her urinary symptoms  - # 4 of 6 BCG installed today with Coude catheter  - Patient was instructed to pour bleach down his/her toilet for the next 6 hours  -   Instructed to call the office if she should experience fevers greater than 102, chills/rigors, onset of a new cough, night sweats or further bladder spasms or inability to urinate   - RTC in one week for # 5 BCG  - Surveillance protocol also discussed today including cystoscopy every 3 months for at least 2 years and then spread out thereafter  - Urinalysis, Complete  - bcg vaccine injection 81 mg; Instill 3.24 mLs (81 mg total) into the bladder once.  - lidocaine (XYLOCAINE) 2 % jelly 1 application; Place 1 application into the urethra once.    Return in about 1 week (around 04/02/2017) for # 5 BCG.  These notes generated with voice recognition  software. I apologize for typographical errors.  Zara Council, [facility] [address], [place], [place] [address] [phone]

## 2017-03-26 ENCOUNTER — Ambulatory Visit: Payer: Medicare HMO | Admitting: Urology

## 2017-03-26 ENCOUNTER — Encounter: Payer: Self-pay | Admitting: Urology

## 2017-03-26 VITALS — BP 172/74 | HR 84 | Ht 71.0 in | Wt 229.4 lb

## 2017-03-26 DIAGNOSIS — C679 Malignant neoplasm of bladder, unspecified: Secondary | ICD-10-CM | POA: Diagnosis not present

## 2017-03-26 LAB — URINALYSIS, COMPLETE
Bilirubin, UA: NEGATIVE
Glucose, UA: NEGATIVE
Ketones, UA: NEGATIVE
NITRITE UA: NEGATIVE
PH UA: 7 (ref 5.0–7.5)
Protein, UA: NEGATIVE
Specific Gravity, UA: 1.01 (ref 1.005–1.030)
UUROB: 0.2 mg/dL (ref 0.2–1.0)

## 2017-03-26 LAB — MICROSCOPIC EXAMINATION: Epithelial Cells (non renal): NONE SEEN /hpf (ref 0–10)

## 2017-03-26 MED ORDER — BCG LIVE 50 MG IS SUSR
3.2400 mL | Freq: Once | INTRAVESICAL | Status: AC
  Administered 2017-03-26: 81 mg via INTRAVESICAL

## 2017-03-26 MED ORDER — LIDOCAINE HCL 2 % EX GEL
1.0000 "application " | Freq: Once | CUTANEOUS | Status: AC
  Administered 2017-03-26: 1 via URETHRAL

## 2017-03-26 NOTE — Progress Notes (Signed)
BCG Bladder Instillation  BCG # 4  Due to Bladder Cancer patient is present today for a BCG treatment. Patient was cleaned and prepped in a sterile fashion with betadine and lidocaine 2% jelly was instilled into the urethra.  A 14FR catheter was inserted, urine return was noted 100ml, urine was yellow in color.  50ml of reconstituted BCG was instilled into the bladder. The catheter was then removed. Patient tolerated well, no complications were noted  Preformed by: Shannon McGowan PA-C and Ramona Williams CMA  Follow up/ Additional notes: One week   

## 2017-03-26 NOTE — Addendum Note (Signed)
Addended by: Orlene Erm on: 03/26/2017 10:36 AM   Modules accepted: Orders

## 2017-04-01 NOTE — Progress Notes (Signed)
04/02/2017 10:15 AM   Jari Favre May 24, 1941 [id number]  Referring provider: Venia Carbon, MD [address] [place], [place] [address]  Chief Complaint  Patient presents with  . Bladder Cancer    BCG  5     HPI: 76 yo AAM with bladder cancer who presents to begin an induction course of BCG.  This will be # 5/6.    Background history The patient is a 76 year old gentleman who recently underwent TURBT with 3.5 cm right anterior bladder wall tumor. There is focal lamina propria invasion only. Muscularis propria was present negative. 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  This tumor was originally found as part of a gross hematuria workup that was otherwise negative.  Today, he has not urinary complaints.  He denies dysuria, gross hematuria and suprapubic pain.  He denies fevers, chills, nausea or vomiting.  He has not had a new cough. 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  His UA today demonstrates 11-30 WBC's    PMH: Past Medical History:  Diagnosis Date  . Adenomatous polyps   . Allergy   . ED (erectile dysfunction)   . Glaucoma   . Glucose intolerance (impaired glucose tolerance)   . Hemorrhoids   . Hypertension     Surgical History: Past Surgical History:  Procedure Laterality Date  . TRANSURETHRAL RESECTION OF BLADDER TUMOR N/A 01/09/2017   Procedure: TRANSURETHRAL RESECTION OF BLADDER TUMOR (TURBT)-(MEDIUM);  Surgeon: Nickie Retort, MD;  Location: [facility] ORS;  Service: Urology;  Laterality: N/A;    Home Medications:  Allergies as of 04/02/2017   No Known Allergies     Medication List       Accurate as of 04/02/17 10:15 AM. Always use your most recent med list.          amLODipine 5 MG tablet Commonly known as:  NORVASC TAKE 1 TABLET EVERY DAY FOR BLOOD PRESSURE   aspirin 81 MG tablet Take 81 mg by mouth daily.   latanoprost 0.005 % ophthalmic solution Commonly known as:  XALATAN Place 1 drop into both  eyes at bedtime.   loratadine 10 MG tablet Commonly known as:  CLARITIN Take 10 mg by mouth daily as needed for allergies.   timolol 0.5 % ophthalmic solution Commonly known as:  TIMOPTIC Place 1 drop into both eyes daily.            Discharge Care Instructions        Start     Ordered   04/02/17 1000  BCG LIVE 50 MG IS SUSR   Once     04/02/17 0951   04/02/17 1000  LIDOCAINE HCL 2 % EX GEL   Once     04/02/17 0951   04/02/17 0000  Urinalysis, Complete     04/02/17 0946      Allergies: No Known Allergies  Family History: Family History  Problem Relation Age of Onset  . GER disease Mother   . Hypertension Mother   . Kidney failure Mother   . Stroke Father   . Hypertension Father   . Cancer Paternal Grandfather        Leukemia  . Diabetes Paternal Grandfather   . Cancer Brother        throat cancer  . Colon cancer Neg Hx   . Prostate cancer Neg Hx   . Bladder Cancer Neg Hx     Social History:  reports that  he quit smoking about 35 years ago. He has never used smokeless tobacco. He reports that he does not drink alcohol or use drugs.  ROS: UROLOGY Frequent Urination?: No Hard to postpone urination?: No Burning/pain with urination?: No Get up at night to urinate?: No Leakage of urine?: No Urine stream starts and stops?: No Trouble starting stream?: No Do you have to strain to urinate?: No Blood in urine?: No Urinary tract infection?: No Sexually transmitted disease?: No Injury to kidneys or bladder?: No Painful intercourse?: No Weak stream?: No Erection problems?: No Penile pain?: No  Gastrointestinal Nausea?: No Vomiting?: No Indigestion/heartburn?: No Diarrhea?: No Constipation?: No  Constitutional Fever: No Night sweats?: No Weight loss?: No Fatigue?: No  Skin Skin rash/lesions?: No Itching?: No  Eyes Blurred vision?: No Double vision?: No  Ears/Nose/Throat Sore throat?: No Sinus problems?:  No  Hematologic/Lymphatic Swollen glands?: No Easy bruising?: No  Cardiovascular Leg swelling?: No Chest pain?: No  Respiratory Cough?: No Shortness of breath?: No  Endocrine Excessive thirst?: No  Musculoskeletal Back pain?: No Joint pain?: No  Neurological Headaches?: No Dizziness?: No  Psychologic Depression?: No Anxiety?: No  Physical Exam: BP (!) 168/81   Pulse 79   Ht 5\' 11"  (1.803 m)   Wt 227 lb 12.8 oz (103.3 kg)   BMI 31.77 kg/m   Constitutional: Well nourished. Alert and oriented, No acute distress. HEENT: [place] AT, moist mucus membranes. Trachea midline, no masses. Cardiovascular: No clubbing, cyanosis, or edema. Respiratory: Normal respiratory effort, no increased work of breathing. GI: Abdomen is soft, non tender, non distended, no abdominal masses.  GU: No CVA tenderness.  No bladder fullness or masses.  Patient with uncircumcised phallus.  Foreskin easily retracted   Urethral meatus is patent.  No penile discharge. No penile lesions or rashes.  Skin: No rashes, bruises or suspicious lesions. Lymph: No cervical or inguinal adenopathy. Neurologic: Grossly intact, no focal deficits, moving all 4 extremities. Psychiatric: Normal mood and affect.  Laboratory Data: Lab Results  Component Value Date   WBC 4.5 12/30/2016   HGB 11.8 (L) 12/30/2016   HCT 35.0 (L) 12/30/2016   MCV 84.0 12/30/2016   PLT 236 12/30/2016    Lab Results  Component Value Date   CREATININE 1.15 12/30/2016    Lab Results  Component Value Date   PSA 2.97 11/14/2011   PSA 2.96 08/21/2009   PSA 1.92 08/18/2007    Lab Results  Component Value Date   AST 16 12/05/2016   Lab Results  Component Value Date   ALT 14 12/05/2016    Urinalysis 11 -30 WBC's.   See EPIC.    I have reviewed the labs.  Assessment & Plan:    1. Malignant neoplasm of urinary bladder,   - Reviewed BCG treatment course, possible side effects including BCG sepsis, bladder irritation,  worsening of her urinary symptoms  - # 5 of 6 BCG installed today with Coude catheter  - Patient was instructed to pour bleach down his/her toilet for the next 6 hours  -  Instructed to call the office if she should experience fevers greater than 102, chills/rigors, onset of a new cough, night sweats or further bladder spasms or inability to urinate   - RTC in one week for # 6 BCG  - Surveillance protocol also discussed today including cystoscopy every 3 months for at least 2 years and then spread out thereafter  - Urinalysis, Complete  - bcg vaccine injection 81 mg; Instill 3.24 mLs (81 mg  total) into the bladder once.  - lidocaine (XYLOCAINE) 2 % jelly 1 application; Place 1 application into the urethra once.    Return in about 1 week (around 04/09/2017) for # 6 BCG.  These notes generated with voice recognition software. I apologize for typographical errors.  Zara Council, [facility] [address] [place],  [address] [phone]

## 2017-04-02 ENCOUNTER — Ambulatory Visit (INDEPENDENT_AMBULATORY_CARE_PROVIDER_SITE_OTHER): Payer: Medicare HMO | Admitting: Urology

## 2017-04-02 ENCOUNTER — Encounter: Payer: Self-pay | Admitting: Urology

## 2017-04-02 VITALS — BP 168/81 | HR 79 | Ht 71.0 in | Wt 227.8 lb

## 2017-04-02 DIAGNOSIS — C679 Malignant neoplasm of bladder, unspecified: Secondary | ICD-10-CM | POA: Diagnosis not present

## 2017-04-02 LAB — MICROSCOPIC EXAMINATION

## 2017-04-02 LAB — URINALYSIS, COMPLETE
Bilirubin, UA: NEGATIVE
Glucose, UA: NEGATIVE
Ketones, UA: NEGATIVE
Nitrite, UA: NEGATIVE
Specific Gravity, UA: 1.01 (ref 1.005–1.030)
Urobilinogen, Ur: 1 mg/dL (ref 0.2–1.0)
pH, UA: 5.5 (ref 5.0–7.5)

## 2017-04-02 MED ORDER — BCG LIVE 50 MG IS SUSR
3.2400 mL | Freq: Once | INTRAVESICAL | Status: AC
  Administered 2017-04-02: 81 mg via INTRAVESICAL

## 2017-04-02 MED ORDER — LIDOCAINE HCL 2 % EX GEL
1.0000 "application " | Freq: Once | CUTANEOUS | Status: AC
  Administered 2017-04-02: 1 via URETHRAL

## 2017-04-02 NOTE — Progress Notes (Signed)
BCG Bladder Instillation  BCG # 5  Due to Bladder Cancer patient is present today for a BCG treatment. Patient was cleaned and prepped in a sterile fashion with betadine and lidocaine 2% jelly was instilled into the urethra.  A 14FR Coude catheter was inserted, urine return was noted 2ml, urine was yellow in color.  59ml of reconstituted BCG was instilled into the bladder. The catheter was then removed. Patient tolerated well, no complications were noted.  Preformed by: Zara Council PA-C and Lyndee Hensen CMA  Follow up/ Additional notes: One week

## 2017-04-08 NOTE — Progress Notes (Signed)
04/09/2017 11:56 AM   Matthew Carter 1941-05-30 [id number]  Referring provider: Venia Carbon, MD [address] [place], [place] [address]  Chief Complaint  Patient presents with  . Bladder Cancer    BCG 6    HPI: 76 yo AAM with bladder cancer who presents to begin an induction course of BCG.  This will be # 6/6.    Background history The patient is a 76 year old gentleman who recently underwent TURBT with 3.5 cm right anterior bladder wall tumor. There is focal lamina propria invasion only. Muscularis propria was present negative. 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  This tumor was originally found as part of a gross hematuria workup that was otherwise negative.  Today, he has not urinary complaints.  He denies dysuria, gross hematuria and suprapubic pain.  He denies fevers, chills, nausea or vomiting.  He has not had a new cough. 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  His UA today demonstrates 11-30 WBC's    PMH: Past Medical History:  Diagnosis Date  . Adenomatous polyps   . Allergy   . ED (erectile dysfunction)   . Glaucoma   . Glucose intolerance (impaired glucose tolerance)   . Hemorrhoids   . Hypertension     Surgical History: Past Surgical History:  Procedure Laterality Date  . TRANSURETHRAL RESECTION OF BLADDER TUMOR N/A 01/09/2017   Procedure: TRANSURETHRAL RESECTION OF BLADDER TUMOR (TURBT)-(MEDIUM);  Surgeon: Nickie Retort, MD;  Location: [facility] ORS;  Service: Urology;  Laterality: N/A;    Home Medications:  Allergies as of 04/09/2017   No Known Allergies     Medication List       Accurate as of 04/09/17 11:56 AM. Always use your most recent med list.          amLODipine 5 MG tablet Commonly known as:  NORVASC TAKE 1 TABLET EVERY DAY FOR BLOOD PRESSURE   aspirin 81 MG tablet Take 81 mg by mouth daily.   latanoprost 0.005 % ophthalmic solution Commonly known as:  XALATAN Place 1 drop into both eyes  at bedtime.   loratadine 10 MG tablet Commonly known as:  CLARITIN Take 10 mg by mouth daily as needed for allergies.   timolol 0.5 % ophthalmic solution Commonly known as:  TIMOPTIC Place 1 drop into both eyes daily.            Discharge Care Instructions        Start     Ordered   04/09/17 1130  BCG LIVE 55 MG IS SUSR   Once     04/09/17 1121   04/09/17 1130  LIDOCAINE HCL 2 % EX GEL   Once     04/09/17 1121   04/09/17 0000  Urinalysis, Complete     04/09/17 1119      Allergies: No Known Allergies  Family History: Family History  Problem Relation Age of Onset  . GER disease Mother   . Hypertension Mother   . Kidney failure Mother   . Stroke Father   . Hypertension Father   . Cancer Paternal Grandfather        Leukemia  . Diabetes Paternal Grandfather   . Cancer Brother        throat cancer  . Colon cancer Neg Hx   . Prostate cancer Neg Hx   . Bladder Cancer Neg Hx     Social History:  reports that he quit  smoking about 35 years ago. He has never used smokeless tobacco. He reports that he does not drink alcohol or use drugs.  ROS: UROLOGY Frequent Urination?: No Hard to postpone urination?: No Burning/pain with urination?: No Get up at night to urinate?: No Leakage of urine?: No Urine stream starts and stops?: No Trouble starting stream?: No Do you have to strain to urinate?: No Blood in urine?: No Urinary tract infection?: No Sexually transmitted disease?: No Injury to kidneys or bladder?: No Painful intercourse?: No Weak stream?: No Erection problems?: No Penile pain?: No  Gastrointestinal Nausea?: No Vomiting?: No Indigestion/heartburn?: No Diarrhea?: No Constipation?: No  Constitutional Fever: No Night sweats?: No Weight loss?: No Fatigue?: No  Skin Skin rash/lesions?: No Itching?: No  Eyes Blurred vision?: No Double vision?: No  Ears/Nose/Throat Sore throat?: No Sinus problems?: No  Hematologic/Lymphatic Swollen  glands?: No Easy bruising?: No  Cardiovascular Leg swelling?: No Chest pain?: No  Respiratory Cough?: No Shortness of breath?: No  Endocrine Excessive thirst?: No  Musculoskeletal Back pain?: No Joint pain?: No  Neurological Headaches?: No Dizziness?: No  Psychologic Depression?: No Anxiety?: No  Physical Exam: BP (!) 146/75   Pulse 80   Ht 5\' 11"  (1.803 m)   Wt 219 lb 3.2 oz (99.4 kg)   BMI 30.57 kg/m   Constitutional: Well nourished. Alert and oriented, No acute distress. HEENT: [place] AT, moist mucus membranes. Trachea midline, no masses. Cardiovascular: No clubbing, cyanosis, or edema. Respiratory: Normal respiratory effort, no increased work of breathing. GI: Abdomen is soft, non tender, non distended, no abdominal masses.  GU: No CVA tenderness.  No bladder fullness or masses.  Patient with uncircumcised phallus.  Foreskin easily retracted   Urethral meatus is patent.  No penile discharge. No penile lesions or rashes.  Skin: No rashes, bruises or suspicious lesions. Lymph: No cervical or inguinal adenopathy. Neurologic: Grossly intact, no focal deficits, moving all 4 extremities. Psychiatric: Normal mood and affect.  Laboratory Data: Lab Results  Component Value Date   WBC 4.5 12/30/2016   HGB 11.8 (L) 12/30/2016   HCT 35.0 (L) 12/30/2016   MCV 84.0 12/30/2016   PLT 236 12/30/2016    Lab Results  Component Value Date   CREATININE 1.15 12/30/2016    Lab Results  Component Value Date   PSA 2.97 11/14/2011   PSA 2.96 08/21/2009   PSA 1.92 08/18/2007    Lab Results  Component Value Date   AST 16 12/05/2016   Lab Results  Component Value Date   ALT 14 12/05/2016    Urinalysis 11 -30 WBC's.   See EPIC.    I have reviewed the labs.  Assessment & Plan:    1. Malignant neoplasm of urinary bladder,   - Reviewed BCG treatment course, possible side effects including BCG sepsis, bladder irritation, worsening of her urinary symptoms  - # 6 of  6 BCG installed today with Coude catheter  - Patient was instructed to pour bleach down his/her toilet for the next 6 hours  -  Instructed to call the office if she should experience fevers greater than 102, chills/rigors, onset of a new cough, night sweats or further bladder spasms or inability to urinate   - RTC on 07/09/2017 for surveillance cystoscopy  - Surveillance protocol also discussed today including cystoscopy every 3 months for at least 2 years and then spread out thereafter  - Urinalysis, Complete  - bcg vaccine injection 81 mg; Instill 3.24 mLs (81 mg total) into the bladder  once.  - lidocaine (XYLOCAINE) 2 % jelly 1 application; Place 1 application into the urethra once.    Return for December 6 for surveillance cystoscopy.  These notes generated with voice recognition software. I apologize for typographical errors.  Zara Council, [facility] [address] [place], [place] [address] [phone]

## 2017-04-09 ENCOUNTER — Ambulatory Visit: Payer: Medicare HMO | Admitting: Urology

## 2017-04-09 ENCOUNTER — Encounter: Payer: Self-pay | Admitting: Urology

## 2017-04-09 VITALS — BP 146/75 | HR 80 | Ht 71.0 in | Wt 219.2 lb

## 2017-04-09 DIAGNOSIS — C679 Malignant neoplasm of bladder, unspecified: Secondary | ICD-10-CM | POA: Diagnosis not present

## 2017-04-09 LAB — URINALYSIS, COMPLETE
Bilirubin, UA: NEGATIVE
GLUCOSE, UA: NEGATIVE
KETONES UA: NEGATIVE
Nitrite, UA: NEGATIVE
RBC UA: NEGATIVE
SPEC GRAV UA: 1.015 (ref 1.005–1.030)
UUROB: 0.2 mg/dL (ref 0.2–1.0)
pH, UA: 6 (ref 5.0–7.5)

## 2017-04-09 LAB — MICROSCOPIC EXAMINATION: RBC MICROSCOPIC, UA: NONE SEEN /HPF (ref 0–?)

## 2017-04-09 MED ORDER — BCG LIVE 50 MG IS SUSR
3.2400 mL | Freq: Once | INTRAVESICAL | Status: AC
  Administered 2017-04-09: 81 mg via INTRAVESICAL

## 2017-04-09 MED ORDER — LIDOCAINE HCL 2 % EX GEL
1.0000 "application " | Freq: Once | CUTANEOUS | Status: AC
  Administered 2017-04-09: 1 via URETHRAL

## 2017-04-09 NOTE — Progress Notes (Signed)
BCG Bladder Instillation  BCG # 6  Due to Bladder Cancer patient is present today for a BCG treatment. Patient was cleaned and prepped in a sterile fashion with betadine and lidocaine 2% jelly was instilled into the urethra.  A 14FR catheter was inserted, urine return was noted 140ml, urine was yellow in color.  32ml of reconstituted BCG was instilled into the bladder. The catheter was then removed. Patient tolerated well, no complications were noted.  Preformed by: Zara Council PA-C and Lyndee Hensen CMA  Follow up/ Additional notes: 3 months for Cystoscopy

## 2017-04-10 DIAGNOSIS — H401131 Primary open-angle glaucoma, bilateral, mild stage: Secondary | ICD-10-CM | POA: Diagnosis not present

## 2017-04-15 ENCOUNTER — Ambulatory Visit (INDEPENDENT_AMBULATORY_CARE_PROVIDER_SITE_OTHER): Payer: Medicare HMO

## 2017-04-15 DIAGNOSIS — Z23 Encounter for immunization: Secondary | ICD-10-CM | POA: Diagnosis not present

## 2017-07-09 ENCOUNTER — Ambulatory Visit: Payer: Medicare HMO | Admitting: Urology

## 2017-07-09 ENCOUNTER — Encounter: Payer: Self-pay | Admitting: Urology

## 2017-07-09 VITALS — BP 163/78 | HR 82 | Ht 71.0 in | Wt 225.0 lb

## 2017-07-09 DIAGNOSIS — C678 Malignant neoplasm of overlapping sites of bladder: Secondary | ICD-10-CM | POA: Diagnosis not present

## 2017-07-09 MED ORDER — CIPROFLOXACIN HCL 500 MG PO TABS
500.0000 mg | ORAL_TABLET | Freq: Once | ORAL | Status: AC
  Administered 2017-07-09: 500 mg via ORAL

## 2017-07-09 MED ORDER — LIDOCAINE HCL 2 % EX GEL
1.0000 "application " | Freq: Once | CUTANEOUS | Status: AC
  Administered 2017-07-09: 1 via URETHRAL

## 2017-07-09 NOTE — Progress Notes (Signed)
   07/09/17  CC:  Chief Complaint  Patient presents with  . Cysto    HPI: The patient is a 76 year old gentleman who underwent TURBT in June 2018 with 3.5 cm right anterior bladder wall tumor. There is focal lamina propria invasion only. Muscularis propria was present negative. 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  This tumor was originally found as part of a gross hematuria workup that was otherwise negative.  The patient and family elected not to undergo repeat TUR of the base of the tumor site.  Instead he underwent induction BCG which he completed in September 2018.  There were no vitals taken for this visit. NED. A&Ox3.   No respiratory distress   Abd soft, NT, ND Normal phallus with bilateral descended testicles  Cystoscopy Procedure Note  Patient identification was confirmed, informed consent was obtained, and patient was prepped using Betadine solution.  Lidocaine jelly was administered per urethral meatus.    Preoperative abx where received prior to procedure.     Pre-Procedure: - Inspection reveals a normal caliber ureteral meatus.  Procedure: The flexible cystoscope was introduced without difficulty - No urethral strictures/lesions are present. - Enlarged prostate visually obstructive prostate approximately 5 cm in length - Normal bladder neck - Bilateral ureteral orifices identified - Bladder mucosa reveals no ulcers, tumors, or lesions - No bladder stones - No trabeculation  Retroflexion shows no intravesical lobe   Post-Procedure: - Patient tolerated the procedure well  Assessment/ Plan:  1.  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  Repeat cystoscopy in 3 months.  At his next visit, we will need to discuss maintenance BCG which he will need for a total of 3 years.

## 2017-07-10 LAB — URINALYSIS, COMPLETE
BILIRUBIN UA: NEGATIVE
GLUCOSE, UA: NEGATIVE
KETONES UA: NEGATIVE
LEUKOCYTES UA: NEGATIVE
Nitrite, UA: NEGATIVE
PROTEIN UA: NEGATIVE
RBC UA: NEGATIVE
SPEC GRAV UA: 1.015 (ref 1.005–1.030)
Urobilinogen, Ur: 1 mg/dL (ref 0.2–1.0)
pH, UA: 6.5 (ref 5.0–7.5)

## 2017-10-08 ENCOUNTER — Other Ambulatory Visit: Payer: Self-pay

## 2017-10-12 DIAGNOSIS — H401131 Primary open-angle glaucoma, bilateral, mild stage: Secondary | ICD-10-CM | POA: Diagnosis not present

## 2017-10-15 ENCOUNTER — Ambulatory Visit: Payer: Medicare HMO | Admitting: Urology

## 2017-10-15 ENCOUNTER — Encounter: Payer: Self-pay | Admitting: Urology

## 2017-10-15 VITALS — BP 158/77 | HR 80 | Ht 71.0 in | Wt 232.3 lb

## 2017-10-15 DIAGNOSIS — C678 Malignant neoplasm of overlapping sites of bladder: Secondary | ICD-10-CM

## 2017-10-15 LAB — URINALYSIS, COMPLETE
Bilirubin, UA: NEGATIVE
GLUCOSE, UA: NEGATIVE
KETONES UA: NEGATIVE
NITRITE UA: NEGATIVE
Protein, UA: NEGATIVE
SPEC GRAV UA: 1.015 (ref 1.005–1.030)
UUROB: 1 mg/dL (ref 0.2–1.0)
pH, UA: 6.5 (ref 5.0–7.5)

## 2017-10-15 LAB — MICROSCOPIC EXAMINATION: EPITHELIAL CELLS (NON RENAL): NONE SEEN /HPF (ref 0–10)

## 2017-10-15 MED ORDER — CIPROFLOXACIN HCL 500 MG PO TABS
500.0000 mg | ORAL_TABLET | Freq: Once | ORAL | Status: AC
Start: 2017-10-15 — End: 2017-10-15
  Administered 2017-10-15: 500 mg via ORAL

## 2017-10-15 MED ORDER — LIDOCAINE HCL 2 % EX GEL
1.0000 "application " | Freq: Once | CUTANEOUS | Status: AC
  Administered 2017-10-15: 1 via URETHRAL

## 2017-10-15 NOTE — Progress Notes (Signed)
   10/15/17  CC:  Chief Complaint  Patient presents with  . Cysto    HPI: The patient is a 77 year old gentleman who underwent TURBT in June 2018 with 3.5cm right anterior bladder wall tumor. There was focal lamina propria invasion only. Muscularis propria was present and negative. This makes him a stage pT1disease with high grade TCC. This tumor was originally found as part of a gross hematuria workup that was otherwise negative.  The patient and family elected not to undergo repeat TUR of the base of the tumor site.  Instead he underwent induction BCG which he completed in September 2018.  There were no vitals taken for this visit. NED. A&Ox3.   No respiratory distress   Abd soft, NT, ND Normal phallus with bilateral descended testicles  Cystoscopy Procedure Note  Patient identification was confirmed, informed consent was obtained, and patient was prepped using Betadine solution.  Lidocaine jelly was administered per urethral meatus.    Preoperative abx where received prior to procedure.     Pre-Procedure: - Inspection reveals a normal caliber ureteral meatus.  Procedure: The flexible cystoscope was introduced without difficulty - No urethral strictures/lesions are present. - Enlarged prostate visually obstructive 3-4 cm in length. - Normal bladder neck - Bilateral ureteral orifices identified - Bladder mucosa  reveals no ulcers, tumors, or lesions - No bladder stones - No trabeculation  Retroflexion shows no intravesical lobe.   Post-Procedure: - Patient tolerated the procedure well  Assessment/ Plan:  1.  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 Patient will follow up for maintenance BCG. He will need repeat cystoscopy 3 months after completing maintenance BCG.  He will need 3 years total of maintenance BCG.

## 2017-10-28 NOTE — Progress Notes (Signed)
    10/29/2017 1:30 PM   Matthew Carter [birth year] [id number]  Referring provider: Venia Carbon, MD [place], [place] [address]  No chief complaint on file.   HPI: 77 yo AAM with bladder cancer who presents to begin a maintanence course of BCG.  This will be # 1/3.      Urinalysis > 30 WBC's.  Moderate bacteria.  See Epic.  I have reviewed the labs.  Procedure BCG Bladder Instillation  BCG not given today.  Urine sent for culture.     Assessment & Plan:    1. 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 Patient will follow up for maintenance BCG. He will need repeat cystoscopy 3 months after completing maintenance BCG.  He will need 3 years total of maintenance BCG.    Return for Pending urine culture results.  These notes generated with voice recognition software. I apologize for typographical errors.  Zara Council, [facility] [address] [place], [place] [address] [phone]

## 2017-10-29 ENCOUNTER — Ambulatory Visit: Payer: Medicare HMO | Admitting: Urology

## 2017-10-29 DIAGNOSIS — C679 Malignant neoplasm of bladder, unspecified: Secondary | ICD-10-CM

## 2017-10-29 LAB — URINALYSIS, COMPLETE
Bilirubin, UA: NEGATIVE
Glucose, UA: NEGATIVE
Ketones, UA: NEGATIVE
Nitrite, UA: NEGATIVE
PH UA: 6.5 (ref 5.0–7.5)
PROTEIN UA: NEGATIVE
Specific Gravity, UA: 1.01 (ref 1.005–1.030)
Urobilinogen, Ur: 1 mg/dL (ref 0.2–1.0)

## 2017-10-29 LAB — MICROSCOPIC EXAMINATION: WBC, UA: >30 /hpf — AB (ref 0–5)

## 2017-11-01 LAB — CULTURE, URINE COMPREHENSIVE

## 2017-11-02 ENCOUNTER — Telehealth: Payer: Self-pay

## 2017-11-02 NOTE — Telephone Encounter (Signed)
Spoke w/patient's wife and she was notified

## 2017-11-02 NOTE — Telephone Encounter (Signed)
-----   Message from Nori Riis, PA-C sent at 11/01/2017  8:27 PM EDT ----- Please let Mr. Steppe know that his urine culture is negative and we can proceed with his BCG.

## 2017-11-05 ENCOUNTER — Ambulatory Visit: Payer: Self-pay

## 2017-11-12 ENCOUNTER — Ambulatory Visit (INDEPENDENT_AMBULATORY_CARE_PROVIDER_SITE_OTHER): Payer: Medicare HMO

## 2017-11-12 DIAGNOSIS — C679 Malignant neoplasm of bladder, unspecified: Secondary | ICD-10-CM

## 2017-11-12 LAB — URINALYSIS, COMPLETE
BILIRUBIN UA: NEGATIVE
GLUCOSE, UA: NEGATIVE
Ketones, UA: NEGATIVE
Nitrite, UA: NEGATIVE
PH UA: 5.5 (ref 5.0–7.5)
PROTEIN UA: NEGATIVE
Specific Gravity, UA: 1.01 (ref 1.005–1.030)
UUROB: 1 mg/dL (ref 0.2–1.0)

## 2017-11-12 LAB — MICROSCOPIC EXAMINATION
BACTERIA UA: NONE SEEN
Epithelial Cells (non renal): NONE SEEN /hpf (ref 0–10)
RBC, UA: NONE SEEN /hpf (ref 0–2)

## 2017-11-12 MED ORDER — BCG LIVE 50 MG IS SUSR
3.2400 mL | Freq: Once | INTRAVESICAL | Status: AC
Start: 2017-11-12 — End: 2017-11-12
  Administered 2017-11-12: 81 mg via INTRAVESICAL

## 2017-11-12 NOTE — Progress Notes (Signed)
BCG Bladder Instillation  BCG # 1- 1/3 dose  Due to Bladder Cancer patient is present today for a BCG treatment. Patient was cleaned and prepped in a sterile fashion with betadine and lidocaine 2% jelly was instilled into the urethra.  A 14FR catheter was inserted, urine return was noted 185ml, urine was yellow in color.  80ml of reconstituted BCG was instilled into the bladder. The catheter was then removed. Patient tolerated well, no complications were noted  Preformed by: Toniann Fail, LPN & Elberta Leatherwood, [place]

## 2017-11-19 ENCOUNTER — Ambulatory Visit (INDEPENDENT_AMBULATORY_CARE_PROVIDER_SITE_OTHER): Payer: Medicare HMO

## 2017-11-19 DIAGNOSIS — C679 Malignant neoplasm of bladder, unspecified: Secondary | ICD-10-CM

## 2017-11-19 LAB — URINALYSIS, COMPLETE
Bilirubin, UA: NEGATIVE
Glucose, UA: NEGATIVE
Ketones, UA: NEGATIVE
Nitrite, UA: NEGATIVE
PH UA: 6 (ref 5.0–7.5)
PROTEIN UA: NEGATIVE
Specific Gravity, UA: 1.015 (ref 1.005–1.030)
Urobilinogen, Ur: 1 mg/dL (ref 0.2–1.0)

## 2017-11-19 LAB — MICROSCOPIC EXAMINATION

## 2017-11-19 MED ORDER — BCG LIVE 50 MG IS SUSR
3.2400 mL | Freq: Once | INTRAVESICAL | Status: AC
  Administered 2017-11-19: 81 mg via INTRAVESICAL

## 2017-11-19 NOTE — Progress Notes (Signed)
BCG Bladder Instillation  BCG # 2/3- third of dose  Due to Bladder Cancer patient is present today for a BCG treatment. Patient was cleaned and prepped in a sterile fashion with betadine and lidocaine 2% jelly was instilled into the urethra.  A 14FR catheter was inserted, urine return was noted 100ml, urine was yellow in color.  50ml of reconstituted BCG was instilled into the bladder. The catheter was then removed. Patient tolerated well, no complications were noted  Preformed by: Chelsea Watkins, LPN   Follow up/ Additional notes: 1 week   

## 2017-11-26 ENCOUNTER — Ambulatory Visit (INDEPENDENT_AMBULATORY_CARE_PROVIDER_SITE_OTHER): Payer: Medicare HMO

## 2017-11-26 DIAGNOSIS — C679 Malignant neoplasm of bladder, unspecified: Secondary | ICD-10-CM | POA: Diagnosis not present

## 2017-11-26 MED ORDER — BCG LIVE 50 MG IS SUSR
3.2400 mL | Freq: Once | INTRAVESICAL | Status: AC
  Administered 2017-11-26: 81 mg via INTRAVESICAL

## 2017-11-26 NOTE — Progress Notes (Signed)
BCG Bladder Instillation  BCG # 3- 1/2 dose  Due to Bladder Cancer patient is present today for a BCG treatment. Patient was cleaned and prepped in a sterile fashion with betadine and lidocaine 2% jelly was instilled into the urethra.  A 14FR catheter was inserted, urine return was noted 121ml, urine was yellow in color.  101ml of reconstituted BCG was instilled into the bladder. The catheter was then removed. Patient tolerated well, no complications were noted  Preformed by: Toniann Fail, LPN, Regan Lemming, CMA  Follow up/ Additional notes: 3 mo for cysto

## 2017-11-27 LAB — MICROSCOPIC EXAMINATION
Bacteria, UA: NONE SEEN
RBC, UA: NONE SEEN /hpf (ref 0–2)

## 2017-11-27 LAB — URINALYSIS, COMPLETE
BILIRUBIN UA: NEGATIVE
Glucose, UA: NEGATIVE
Ketones, UA: NEGATIVE
Nitrite, UA: NEGATIVE
PH UA: 5.5 (ref 5.0–7.5)
PROTEIN UA: NEGATIVE
RBC UA: NEGATIVE
Specific Gravity, UA: 1.01 (ref 1.005–1.030)
Urobilinogen, Ur: 0.2 mg/dL (ref 0.2–1.0)

## 2017-12-07 ENCOUNTER — Encounter: Payer: Medicare HMO | Admitting: Internal Medicine

## 2018-01-04 ENCOUNTER — Encounter: Payer: Self-pay | Admitting: Internal Medicine

## 2018-01-04 ENCOUNTER — Ambulatory Visit (INDEPENDENT_AMBULATORY_CARE_PROVIDER_SITE_OTHER): Payer: Medicare HMO | Admitting: Internal Medicine

## 2018-01-04 ENCOUNTER — Encounter

## 2018-01-04 VITALS — BP 122/70 | HR 84 | Temp 98.1°F | Ht 71.0 in | Wt 229.0 lb

## 2018-01-04 DIAGNOSIS — Z23 Encounter for immunization: Secondary | ICD-10-CM

## 2018-01-04 DIAGNOSIS — I10 Essential (primary) hypertension: Secondary | ICD-10-CM

## 2018-01-04 DIAGNOSIS — J301 Allergic rhinitis due to pollen: Secondary | ICD-10-CM

## 2018-01-04 DIAGNOSIS — C673 Malignant neoplasm of anterior wall of bladder: Secondary | ICD-10-CM | POA: Diagnosis not present

## 2018-01-04 DIAGNOSIS — Z7189 Other specified counseling: Secondary | ICD-10-CM

## 2018-01-04 DIAGNOSIS — Z0000 Encounter for general adult medical examination without abnormal findings: Secondary | ICD-10-CM

## 2018-01-04 LAB — CBC
HEMATOCRIT: 37.3 % — AB (ref 39.0–52.0)
HEMOGLOBIN: 12.3 g/dL — AB (ref 13.0–17.0)
MCHC: 32.9 g/dL (ref 30.0–36.0)
MCV: 85.8 fl (ref 78.0–100.0)
Platelets: 262 10*3/uL (ref 150.0–400.0)
RBC: 4.35 Mil/uL (ref 4.22–5.81)
RDW: 14.5 % (ref 11.5–15.5)
WBC: 5.1 10*3/uL (ref 4.0–10.5)

## 2018-01-04 LAB — COMPREHENSIVE METABOLIC PANEL
ALT: 8 U/L (ref 0–53)
AST: 11 U/L (ref 0–37)
Albumin: 3.6 g/dL (ref 3.5–5.2)
Alkaline Phosphatase: 95 U/L (ref 39–117)
BUN: 10 mg/dL (ref 6–23)
CALCIUM: 9.6 mg/dL (ref 8.4–10.5)
CO2: 32 mEq/L (ref 19–32)
Chloride: 104 mEq/L (ref 96–112)
Creatinine, Ser: 1.09 mg/dL (ref 0.40–1.50)
GFR: 84.73 mL/min (ref >60.00)
Glucose, Bld: 103 mg/dL — ABNORMAL HIGH (ref 70–99)
POTASSIUM: 4.1 meq/L (ref 3.5–5.1)
Sodium: 140 mEq/L (ref 135–145)
Total Bilirubin: 0.3 mg/dL (ref 0.2–1.2)
Total Protein: 7 g/dL (ref 6.0–8.3)

## 2018-01-04 NOTE — Assessment & Plan Note (Signed)
Does okay with claritin

## 2018-01-04 NOTE — Progress Notes (Signed)
Hearing Screening   Method: Audiometry   125Hz  250Hz  500Hz  1000Hz  2000Hz  3000Hz  4000Hz  6000Hz  8000Hz   Right ear:   40 0 0  40    Left ear:   40 0 40  40    Vision Screening Comments: January 2019

## 2018-01-04 NOTE — Assessment & Plan Note (Signed)
See social history 

## 2018-01-04 NOTE — Addendum Note (Signed)
Addended by: Pilar Grammes on: 01/04/2018 12:46 PM   Modules accepted: Orders

## 2018-01-04 NOTE — Assessment & Plan Note (Signed)
Currently getting BCG

## 2018-01-04 NOTE — Assessment & Plan Note (Signed)
I have personally reviewed the Medicare Annual Wellness questionnaire and have noted 1. The patient's medical and social history 2. Their use of alcohol, tobacco or illicit drugs 3. Their current medications and supplements 4. The patient's functional ability including ADL's, fall risks, home safety risks and hearing or visual             impairment. 5. Diet and physical activities 6. Evidence for depression or mood disorders  The patients weight, height, BMI and visual acuity have been recorded in the chart I have made referrals, counseling and provided education to the patient based review of the above and I have provided the pt with a written personalized care plan for preventive services.  I have provided you with a copy of your personalized plan for preventive services. Please take the time to review along with your updated medication list.  Pneumovax booster Yearly flu vaccine Colon due in November--probably the last Discussed exercise and resistance training Discussed shingrix No PSA due to age

## 2018-01-04 NOTE — Progress Notes (Signed)
Subjective:    Patient ID: Matthew Carter, male    DOB: 04-26-1941, 77 y.o.   MRN: [id number]  HPI Here for Medicare wellness visit and follow up of chronic health condtions Reviewed form and advanced directives Reviewed other doctors No alcohol or tobacco Walks occasionally--but very short distance. discussed Vision is okay---still on Rx for glaucoma Hearing is not great---he doesn't think it is a problem No falls No depression or anhedonia Independent with instrumental ADLs No sig memory issues  Was diagnosed with bladder cancer Now getting BCG infusions No more hematuria  No chest pain No SOB  No palpitations No dizziness or syncope No edema No headaches  Current Outpatient Medications on File Prior to Visit  Medication Sig Dispense Refill  . amLODipine (NORVASC) 5 MG tablet TAKE 1 TABLET EVERY DAY FOR BLOOD PRESSURE 90 tablet 3  . aspirin 81 MG tablet Take 81 mg by mouth daily.    Marland Kitchen latanoprost (XALATAN) 0.005 % ophthalmic solution Place 1 drop into both eyes at bedtime.     Marland Kitchen loratadine (CLARITIN) 10 MG tablet Take 10 mg by mouth daily as needed for allergies.    Marland Kitchen timolol (TIMOPTIC) 0.5 % ophthalmic solution Place 1 drop into both eyes daily.      No current facility-administered medications on file prior to visit.     No Known Allergies  Past Medical History:  Diagnosis Date  . Adenomatous polyps   . Allergy   . ED (erectile dysfunction)   . Glaucoma   . Glucose intolerance (impaired glucose tolerance)   . Hemorrhoids   . Hypertension     Past Surgical History:  Procedure Laterality Date  . TRANSURETHRAL RESECTION OF BLADDER TUMOR N/A 01/09/2017   Procedure: TRANSURETHRAL RESECTION OF BLADDER TUMOR (TURBT)-(MEDIUM);  Surgeon: Nickie Retort, MD;  Location: [facility] ORS;  Service: Urology;  Laterality: N/A;    Family History  Problem Relation Age of Onset  . GER disease Mother   . Hypertension Mother   . Kidney failure Mother   . Stroke Father     . Hypertension Father   . Cancer Paternal Grandfather        Leukemia  . Diabetes Paternal Grandfather   . Cancer Brother        throat cancer  . Colon cancer Neg Hx   . Prostate cancer Neg Hx   . Bladder Cancer Neg Hx     Social History   Socioeconomic History  . Marital status: Married    Spouse name: Not on file  . Number of children: 3  . Years of education: Not on file  . Highest education level: Not on file  Occupational History  . Occupation: Retired Information systems manager: [employer]  . Financial resource strain: Not on file  . Food insecurity:    Worry: Not on file    Inability: Not on file  . Transportation needs:    Medical: Not on file    Non-medical: Not on file  Tobacco Use  . Smoking status: Former Smoker    Last attempt to quit: 08/04/1981    Years since quitting: 36.4  . Smokeless tobacco: Never Used  Substance and Sexual Activity  . Alcohol use: No    Alcohol/week: 0.0 oz  . Drug use: No  . Sexual activity: Not on file  Lifestyle  . Physical activity:    Days per week: Not on file    Minutes per session: Not  on file  . Stress: Not on file  Relationships  . Social connections:    Talks on phone: Not on file    Gets together: Not on file    Attends religious service: Not on file    Active member of club or organization: Not on file    Attends meetings of clubs or organizations: Not on file    Relationship status: Not on file  . Intimate partner violence:    Fear of current or ex partner: Not on file    Emotionally abused: Not on file    Physically abused: Not on file    Forced sexual activity: Not on file  Other Topics Concern  . Not on file  Social History Narrative   Has living will    No formal health care POA but requests wife--alternate is son   Would accept resuscitation attempts but no prolonged artificial ventilation   Would accept a feeding tube   Review of Systems Appetite is good Weight is  stable Sleeps well Wears seat belt Has dentures---doesn't see dentist No skin problems--no derm No heartburn or dysphagia Occasional allergy problems--- claritin helps    Objective:   Physical Exam  Constitutional: He is oriented to person, place, and time. He appears well-developed. No distress.  HENT:  Mouth/Throat: Oropharynx is clear and moist. No oropharyngeal exudate.  Neck: No thyromegaly present.  Cardiovascular: Normal rate, regular rhythm, normal heart sounds and intact distal pulses. Exam reveals no gallop.  No murmur heard. Respiratory: Effort normal and breath sounds normal. No respiratory distress. He has no wheezes. He has no rales.  GI: Soft. There is no tenderness.  Musculoskeletal: He exhibits no edema or tenderness.  Lymphadenopathy:    He has no cervical adenopathy.  Neurological: He is alert and oriented to person, place, and time.  President-- "Dwaine Deter, [place]" [phone] [id number]-[id number] Recall 3/3  Skin: No rash noted. No erythema.  Psychiatric: He has a normal mood and affect. His behavior is normal.           Assessment & Plan:

## 2018-01-04 NOTE — Assessment & Plan Note (Signed)
BP Readings from Last 3 Encounters:  01/04/18 122/70  10/15/17 (!) 158/77  07/09/17 (!) 163/78   Better now Due for labs

## 2018-01-19 ENCOUNTER — Other Ambulatory Visit: Payer: Self-pay

## 2018-01-19 MED ORDER — AMLODIPINE BESYLATE 5 MG PO TABS: ORAL_TABLET | ORAL | 3 refills | Status: DC

## 2018-01-19 NOTE — Telephone Encounter (Signed)
Rx sent electronically.  

## 2018-01-21 IMAGING — CR DG ABDOMEN 1V
1 series · 2 of 2 positions shown · non-contrast
Comparison: None.

CLINICAL DATA: No bowel movements for 3 days. Constipation. Initial
encounter.

EXAM:
ABDOMEN - 1 VIEW

[Series 1: dg abd 1 view · 0.14mm/px · 2 of 2 slices shown]
[im 1/2]
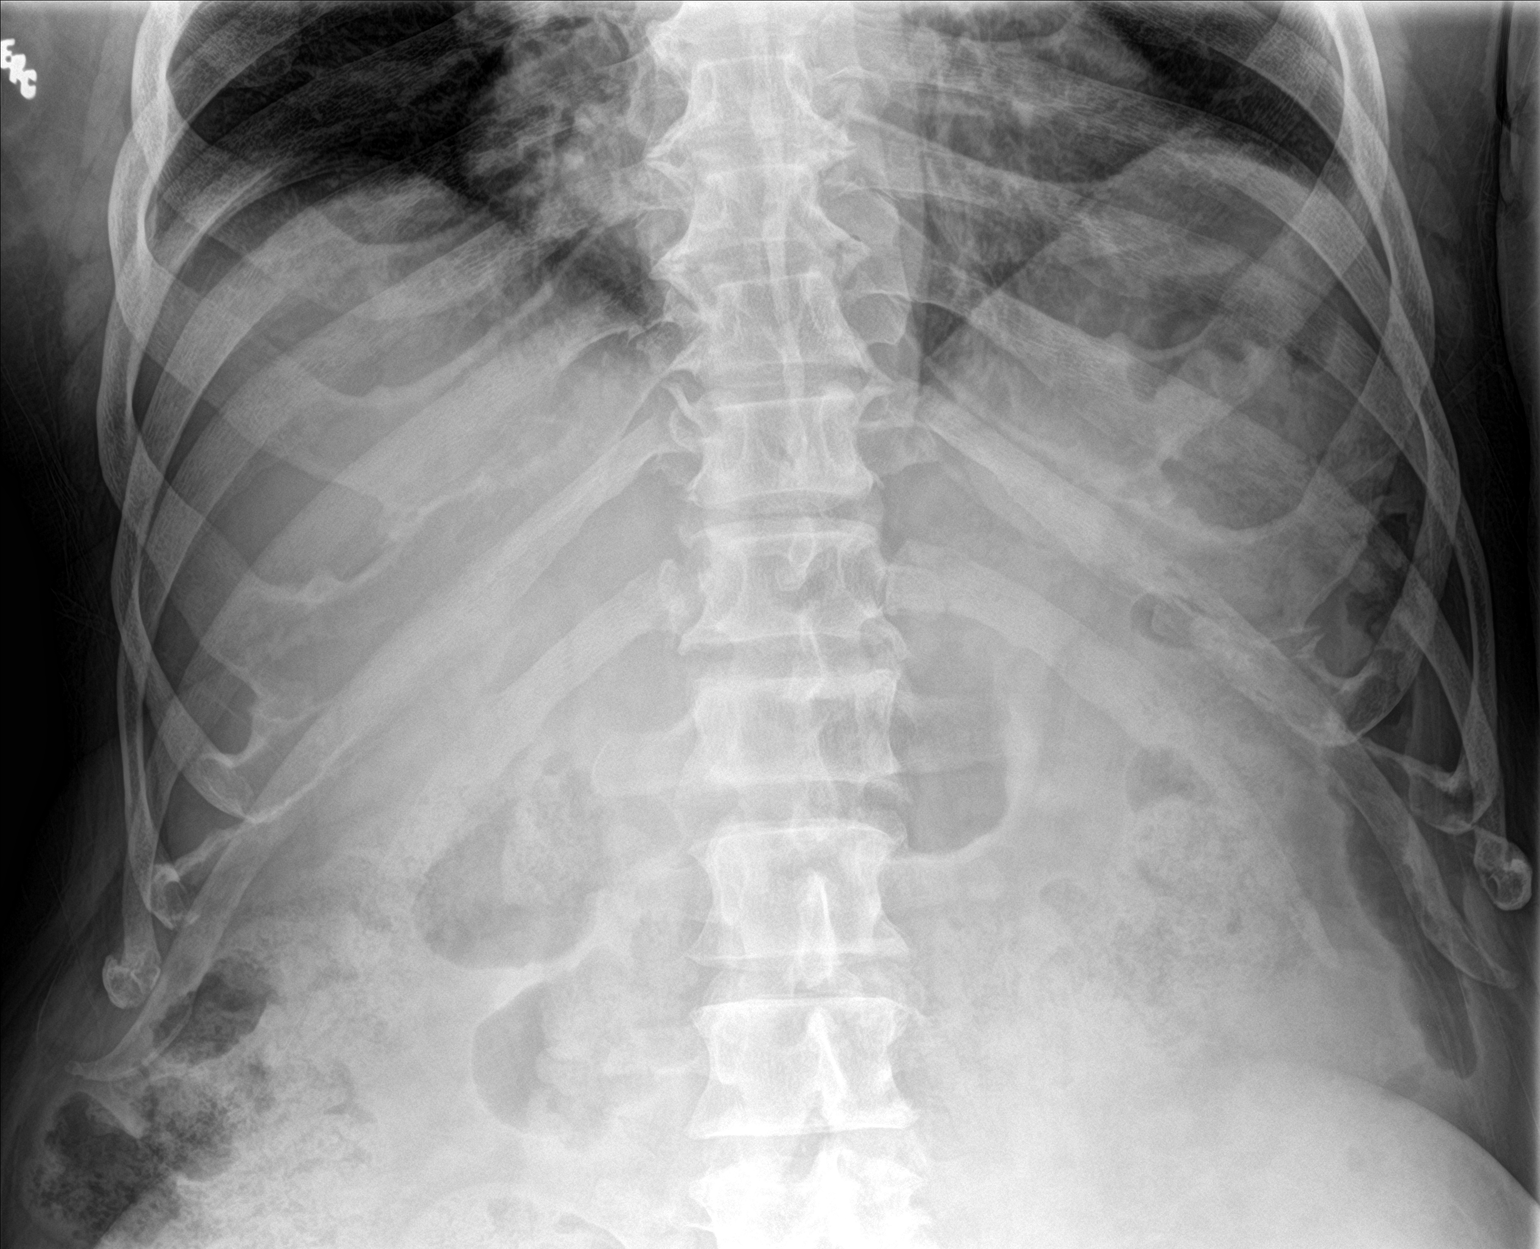
[im 2/2]
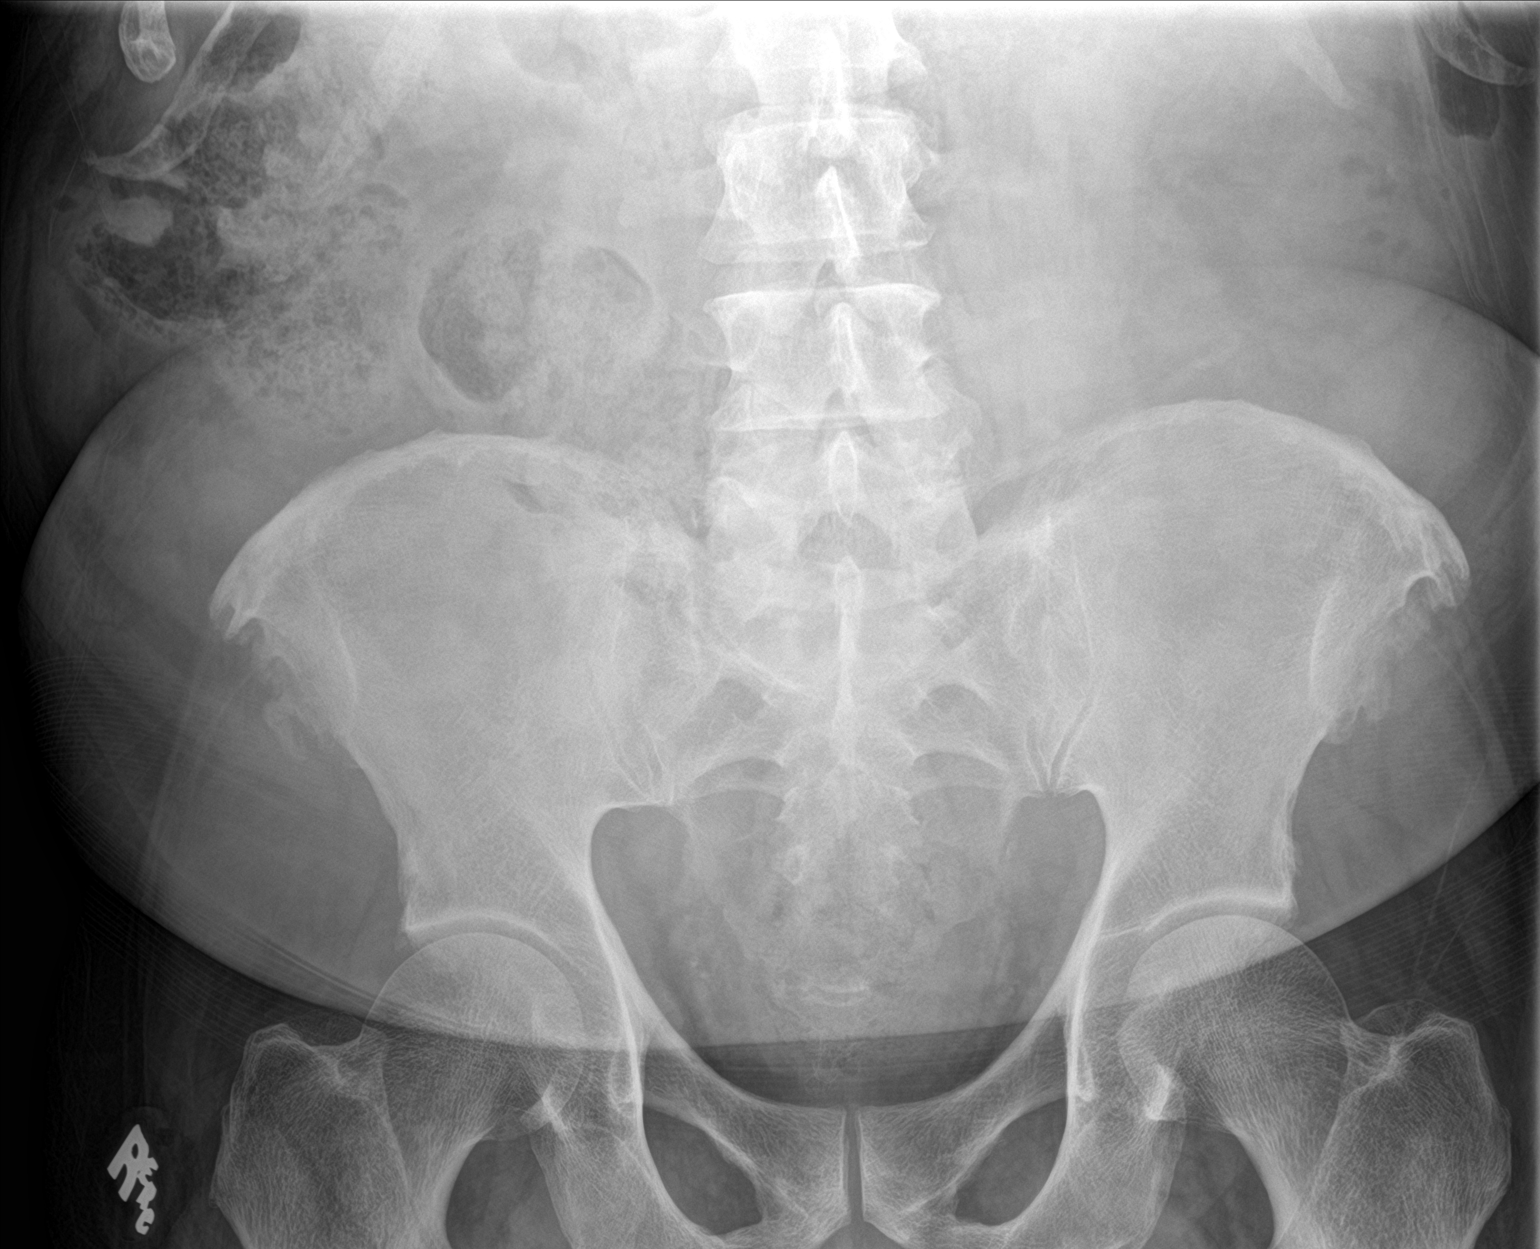

[2 of 2 positions shown; findings below may reference images not displayed]

FINDINGS: The visualized bowel gas pattern is unremarkable. Scattered air and
stool filled loops of colon are seen; no abnormal dilatation of
small bowel loops is seen to suggest small bowel obstruction. No
free intra-abdominal air is identified, though evaluation for free
air is limited on a single supine view.

The visualized osseous structures are within normal limits; the
sacroiliac joints are unremarkable in appearance. The visualized
lung bases are essentially clear.
IMPRESSION: Unremarkable bowel gas pattern; no free intra-abdominal air seen.
Moderate amount of stool noted in the colon.

## 2018-02-11 ENCOUNTER — Other Ambulatory Visit: Payer: Self-pay

## 2018-02-16 ENCOUNTER — Ambulatory Visit: Payer: Medicare HMO | Admitting: Urology

## 2018-02-16 ENCOUNTER — Encounter: Payer: Self-pay | Admitting: Urology

## 2018-02-16 VITALS — BP 145/72 | HR 81 | Resp 16 | Ht 71.0 in | Wt 231.3 lb

## 2018-02-16 DIAGNOSIS — Z8551 Personal history of malignant neoplasm of bladder: Secondary | ICD-10-CM

## 2018-02-16 DIAGNOSIS — N476 Balanoposthitis: Secondary | ICD-10-CM

## 2018-02-16 DIAGNOSIS — C679 Malignant neoplasm of bladder, unspecified: Secondary | ICD-10-CM | POA: Diagnosis not present

## 2018-02-16 LAB — URINALYSIS, COMPLETE
Bilirubin, UA: NEGATIVE
Glucose, UA: NEGATIVE
Ketones, UA: NEGATIVE
Nitrite, UA: NEGATIVE
PH UA: 7 (ref 5.0–7.5)
PROTEIN UA: NEGATIVE
RBC, UA: NEGATIVE
Specific Gravity, UA: 1.015 (ref 1.005–1.030)
UUROB: 1 mg/dL (ref 0.2–1.0)

## 2018-02-16 LAB — MICROSCOPIC EXAMINATION
Epithelial Cells (non renal): NONE SEEN /hpf (ref 0–10)
RBC, UA: NONE SEEN /hpf (ref 0–2)

## 2018-02-16 MED ORDER — NYSTATIN-TRIAMCINOLONE 100000-0.1 UNIT/GM-% EX OINT: 1.0000 "application " | TOPICAL_OINTMENT | Freq: Two times a day (BID) | CUTANEOUS | 0 refills | Status: DC

## 2018-02-16 MED ORDER — LIDOCAINE HCL URETHRAL/MUCOSAL 2 % EX GEL
1.0000 "application " | Freq: Once | CUTANEOUS | Status: AC
  Administered 2018-02-16: 1 via URETHRAL

## 2018-02-16 MED ORDER — CIPROFLOXACIN HCL 500 MG PO TABS
500.0000 mg | ORAL_TABLET | Freq: Once | ORAL | Status: AC
  Administered 2018-02-16: 500 mg via ORAL

## 2018-02-16 NOTE — Progress Notes (Signed)
   02/16/18  CC:  Chief Complaint  Patient presents with  . Cysto    HPI: 77 year old male with personal history of bladder cancer status post TURBT 01/2017 with 3.5 cm right anterior bladder wall tumor.  Surgical pathology consistent with HG T1 TCC.  Lamina propria invasion was focal.  He declined repeat TUR.    He underwent induction BCG x 6 04/2017 and more recently after his maintenance BCG completed 11/2017.  He returns today for routine surveillance cystoscopy today.  Nuys any voiding symptoms.  He was noted to have a penile rash/erythematous plaque but is completely asymptomatic.  He notes that this is been present in the past but is not been bothersome to him.  There were no vitals taken for this visit. NED. A&Ox3.   No respiratory distress   Abd soft, NT, ND Uncircumcised phallus with significant erythema on the coronal margin extending to the foreskin, whitish plaque-like material also present concerning for candidal balanitis  Cystoscopy Procedure Note  Patient identification was confirmed, informed consent was obtained, and patient was prepped using Betadine solution.  Lidocaine jelly was administered per urethral meatus.    Preoperative abx where received prior to procedure.     Pre-Procedure: - Inspection reveals a normal caliber ureteral meatus.  Procedure: The flexible cystoscope was introduced without difficulty - No urethral strictures/lesions are present. - Normal prostate  - Normal bladder neck - Bilateral ureteral orifices identified - Bladder mucosa  reveals no ulcers, tumors, or lesions - No bladder stones - No trabeculation  Retroflexion unremarkable   Post-Procedure: - Patient tolerated the procedure well  Assessment/ Plan:  1. History of bladder cancer No evidence of disease  Given history of high risk bladder cancer, would strongly recommend continuation of maintenance, but in light of national back order, will work to find a partial dose  x 3.  He is agreeable this plan.  Plan for cystoscopy in 3 months.  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    Hollice Espy, MD

## 2018-02-17 ENCOUNTER — Telehealth: Payer: Self-pay | Admitting: Radiology

## 2018-02-17 NOTE — Telephone Encounter (Signed)
Please call the pharmacy and see if they can bring it down into 2 individual components.  Hollice Espy, MD

## 2018-02-17 NOTE — Telephone Encounter (Signed)
Patient states nystatin-triamcinolone ointment is too expensive. Asks if calling to [place] would be cheaper?

## 2018-02-18 NOTE — Telephone Encounter (Signed)
Called the pharmacy they are able to separate the creams for the patient , patient notified on vmail

## 2018-03-18 ENCOUNTER — Ambulatory Visit (INDEPENDENT_AMBULATORY_CARE_PROVIDER_SITE_OTHER): Payer: Medicare HMO

## 2018-03-18 DIAGNOSIS — C679 Malignant neoplasm of bladder, unspecified: Secondary | ICD-10-CM | POA: Diagnosis not present

## 2018-03-18 LAB — URINALYSIS, COMPLETE
Bilirubin, UA: NEGATIVE
GLUCOSE, UA: NEGATIVE
Ketones, UA: NEGATIVE
LEUKOCYTES UA: NEGATIVE
Nitrite, UA: NEGATIVE
Protein, UA: NEGATIVE
RBC, UA: NEGATIVE
Specific Gravity, UA: 1.015 (ref 1.005–1.030)
UUROB: 1 mg/dL (ref 0.2–1.0)
pH, UA: 6 (ref 5.0–7.5)

## 2018-03-18 MED ORDER — BCG LIVE 50 MG IS SUSR
3.2400 mL | Freq: Once | INTRAVESICAL | Status: AC
  Administered 2018-03-18: 81 mg via INTRAVESICAL

## 2018-03-18 NOTE — Progress Notes (Signed)
BCG Bladder Instillation  BCG # 1 of 3 Maint.  Due to Bladder Cancer patient is present today for a BCG treatment. Patient was cleaned and prepped in a sterile fashion with betadine and lidocaine 2% jelly was instilled into the urethra.  A 14FR catheter was inserted, urine return was noted 172ml, urine was yellow in color.  76ml of reconstituted BCG was instilled into the bladder. The catheter was then removed. Patient tolerated well, no complications were noted  Preformed by: Fonnie Jarvis, CMA and Cristie Hem, CMA  Follow up/ Additional notes: BCG was a split 1/2 dose

## 2018-03-23 NOTE — Progress Notes (Signed)
03/24/2018 9:37 AM   Matthew Carter 1940-12-29 [id number]  Referring provider: Venia Carbon, MD [address] [place], [place] [address]  Chief Complaint  Patient presents with  . Follow-up    HPI: Patient is a 77 year old African American male with a history of bladder cancer and balanoposthitis who presents today for recheck of his penis.    It was noted on 02/16/2018 during his surveillance cystoscopy that there was significant erythema of coronal margin foreskin which is otherwise asymptomatic.  He was prescribed Mycolog cream for one month and instructed to return for a recheck.    He is using the cream three times daily.  He states he is using three different creams.  He cannot remember the names of the creams.  He has not noticed a difference.    His UA today is positive for 6-10 WBC's.       PMH: Past Medical History:  Diagnosis Date  . Adenomatous polyps   . Allergy   . ED (erectile dysfunction)   . Glaucoma   . Glucose intolerance (impaired glucose tolerance)   . Hemorrhoids   . Hypertension     Surgical History: Past Surgical History:  Procedure Laterality Date  . TRANSURETHRAL RESECTION OF BLADDER TUMOR N/A 01/09/2017   Procedure: TRANSURETHRAL RESECTION OF BLADDER TUMOR (TURBT)-(MEDIUM);  Surgeon: Nickie Retort, MD;  Location: [facility] ORS;  Service: Urology;  Laterality: N/A;    Home Medications:  Allergies as of 03/24/2018   No Known Allergies     Medication List        Accurate as of 03/24/18  9:37 AM. Always use your most recent med list.          amLODipine 5 MG tablet Commonly known as:  NORVASC TAKE 1 TABLET EVERY DAY FOR BLOOD PRESSURE   aspirin 81 MG tablet Take 81 mg by mouth daily.   latanoprost 0.005 % ophthalmic solution Commonly known as:  XALATAN Place 1 drop into both eyes at bedtime.   loratadine 10 MG tablet Commonly known as:  CLARITIN Take 10 mg by mouth daily as needed for allergies.     nystatin-triamcinolone ointment Commonly known as:  MYCOLOG Apply 1 application topically 2 (two) times daily.   timolol 0.5 % ophthalmic solution Commonly known as:  TIMOPTIC Place 1 drop into both eyes daily.   triamcinolone ointment 0.1 % Commonly known as:  KENALOG APP TOPICALLY BID       Allergies: No Known Allergies  Family History: Family History  Problem Relation Age of Onset  . GER disease Mother   . Hypertension Mother   . Kidney failure Mother   . Stroke Father   . Hypertension Father   . Cancer Paternal Grandfather        Leukemia  . Diabetes Paternal Grandfather   . Cancer Brother        throat cancer  . Colon cancer Neg Hx   . Prostate cancer Neg Hx   . Bladder Cancer Neg Hx     Social History:  reports that he quit smoking about 36 years ago. He has never used smokeless tobacco. He reports that he does not drink alcohol or use drugs.  ROS: UROLOGY Frequent Urination?: No Hard to postpone urination?: No Burning/pain with urination?: No Get up at night to urinate?: Yes Leakage of urine?: No Urine stream starts and stops?: No Trouble starting stream?: No Do you have to strain to urinate?: No Blood in  urine?: No Urinary tract infection?: No Sexually transmitted disease?: No Injury to kidneys or bladder?: No Painful intercourse?: No Weak stream?: No Erection problems?: No Penile pain?: No  Gastrointestinal Nausea?: No Vomiting?: No Indigestion/heartburn?: No Diarrhea?: No Constipation?: No  Constitutional Fever: No Night sweats?: No Weight loss?: No Fatigue?: No  Skin Skin rash/lesions?: No Itching?: No  Eyes Blurred vision?: No Double vision?: No  Ears/Nose/Throat Sore throat?: No Sinus problems?: Yes  Hematologic/Lymphatic Swollen glands?: No Easy bruising?: No  Cardiovascular Leg swelling?: No Chest pain?: No  Respiratory Cough?: No Shortness of breath?: No  Endocrine Excessive thirst?:  No  Musculoskeletal Back pain?: No Joint pain?: No  Neurological Headaches?: No Dizziness?: No  Psychologic Depression?: No Anxiety?: No  Physical Exam: BP (!) 160/78 (BP Location: Left Arm, Patient Position: Sitting, Cuff Size: Normal)   Pulse 89   Ht 5\' 11"  (1.803 m)   Wt 230 lb 3.2 oz (104.4 kg)   BMI 32.11 kg/m   Constitutional:  Well nourished. Alert and oriented, No acute distress. HEENT: [place] AT, moist mucus membranes.  Trachea midline, no masses. Cardiovascular: No clubbing, cyanosis, or edema. Respiratory: Normal respiratory effort, no increased work of breathing. GI: Abdomen is soft, non tender, non distended, no abdominal masses. Liver and spleen not palpable.  No hernias appreciated.  Stool sample for occult testing is not indicated.   GU: No CVA tenderness.  No bladder fullness or masses.  Patient with uncircumcised phallus.   Foreskin easily retracted.  No improvement of the erythema of the coronal margin of the foreskin and the glans.  It is "weepy" today when dried with paper towel.  Urethral meatus is patent.  No penile discharge. No penile lesions or rashes.  Rectal: Not performed Skin: No rashes, bruises or suspicious lesions. Lymph: No cervical or inguinal adenopathy. Neurologic: Grossly intact, no focal deficits, moving all 4 extremities. Psychiatric: Normal mood and affect.  Laboratory Data: Lab Results  Component Value Date   WBC 5.1 01/04/2018   HGB 12.3 (L) 01/04/2018   HCT 37.3 (L) 01/04/2018   MCV 85.8 01/04/2018   PLT 262.0 01/04/2018    Lab Results  Component Value Date   CREATININE 1.09 01/04/2018    Lab Results  Component Value Date   PSA 2.97 11/14/2011   PSA 2.96 08/21/2009   PSA 1.92 08/18/2007    No results found for: TESTOSTERONE  Lab Results  Component Value Date   HGBA1C 6.3 09/06/2015    Lab Results  Component Value Date   TSH 0.37 09/06/2015       Component Value Date/Time   CHOL 161 11/29/2013 0936   HDL  43.10 11/29/2013 0936   CHOLHDL 4 11/29/2013 0936   VLDL 23.8 11/29/2013 0936   LDLCALC 94 11/29/2013 0936    Lab Results  Component Value Date   AST 11 01/04/2018   Lab Results  Component Value Date   ALT 8 01/04/2018   No components found for: ALKALINEPHOPHATASE No components found for: BILIRUBINTOTAL  No results found for: ESTRADIOL  Urinalysis    Component Value Date/Time   COLORURINE YELLOW 08/11/2016 0820   APPEARANCEUR Clear 03/18/2018 1406   LABSPEC 1.015 08/11/2016 0820   PHURINE 6.0 08/11/2016 0820   GLUCOSEU Negative 03/18/2018 1406   GLUCOSEU NEGATIVE 08/11/2016 0820   HGBUR SMALL (A) 08/11/2016 0820   BILIRUBINUR Negative 03/18/2018 1406   KETONESUR NEGATIVE 08/11/2016 0820   PROTEINUR Negative 03/18/2018 1406   UROBILINOGEN 0.2 11/12/2016 0929   UROBILINOGEN 0.2 08/11/2016  0820   NITRITE Negative 03/18/2018 1406   NITRITE NEGATIVE 08/11/2016 0820   LEUKOCYTESUR Negative 03/18/2018 1406    I have reviewed the labs.  Assessment & Plan:   1. Balanoposthitis Area of concern has not improved with the application of topical steroid/antifungal creams Explained to the patient that this is concerning for CIS and it is recommended that we have him undergo a penile biopsy at this time I explained how the procedure is performed and the risks involved, such as pain, infection, bleeding and injury to the penis He is agreeable to the plan and wishes to proceed Patient return for penile biopsy in the operating room  2. Bladder cancer Return for BCG on August 22 and August 29 Return for surveillance cystoscopy on October 16  Return for Penile  biopsy in OR .  These notes generated with voice recognition software. I apologize for typographical errors.  Zara Council, PA-C  [facility] [address]  [place], [place] [address] [phone]

## 2018-03-24 ENCOUNTER — Encounter: Payer: Self-pay | Admitting: Urology

## 2018-03-24 ENCOUNTER — Ambulatory Visit (INDEPENDENT_AMBULATORY_CARE_PROVIDER_SITE_OTHER): Payer: Medicare HMO | Admitting: Urology

## 2018-03-24 VITALS — BP 160/78 | HR 89 | Ht 71.0 in | Wt 230.2 lb

## 2018-03-24 DIAGNOSIS — N476 Balanoposthitis: Secondary | ICD-10-CM

## 2018-03-24 DIAGNOSIS — C679 Malignant neoplasm of bladder, unspecified: Secondary | ICD-10-CM | POA: Diagnosis not present

## 2018-03-24 LAB — MICROSCOPIC EXAMINATION

## 2018-03-24 LAB — URINALYSIS, COMPLETE
BILIRUBIN UA: NEGATIVE
GLUCOSE, UA: NEGATIVE
KETONES UA: NEGATIVE
Nitrite, UA: NEGATIVE
PROTEIN UA: NEGATIVE
SPEC GRAV UA: 1.015 (ref 1.005–1.030)
Urobilinogen, Ur: 1 mg/dL (ref 0.2–1.0)
pH, UA: 7 (ref 5.0–7.5)

## 2018-03-25 ENCOUNTER — Other Ambulatory Visit: Payer: Self-pay | Admitting: Radiology

## 2018-03-25 ENCOUNTER — Ambulatory Visit (INDEPENDENT_AMBULATORY_CARE_PROVIDER_SITE_OTHER): Payer: Medicare HMO

## 2018-03-25 DIAGNOSIS — C679 Malignant neoplasm of bladder, unspecified: Secondary | ICD-10-CM

## 2018-03-25 DIAGNOSIS — N476 Balanoposthitis: Secondary | ICD-10-CM

## 2018-03-25 LAB — URINALYSIS, COMPLETE
BILIRUBIN UA: NEGATIVE
GLUCOSE, UA: NEGATIVE
KETONES UA: NEGATIVE
Nitrite, UA: NEGATIVE
Protein, UA: NEGATIVE
SPEC GRAV UA: 1.02 (ref 1.005–1.030)
Urobilinogen, Ur: 1 mg/dL (ref 0.2–1.0)
pH, UA: 5.5 (ref 5.0–7.5)

## 2018-03-25 LAB — MICROSCOPIC EXAMINATION: Epithelial Cells (non renal): NONE SEEN /hpf (ref 0–10)

## 2018-03-25 LAB — BASIC METABOLIC PANEL
BUN/Creatinine Ratio: 10 (ref 10–24)
BUN: 12 mg/dL (ref 8–27)
CO2: 26 mmol/L (ref 20–29)
CREATININE: 1.18 mg/dL (ref 0.76–1.27)
Calcium: 9.5 mg/dL (ref 8.6–10.2)
Chloride: 104 mmol/L (ref 96–106)
GFR calc Af Amer: 69 mL/min/{1.73_m2} (ref >59)
GFR calc non Af Amer: 60 mL/min/{1.73_m2} (ref >59)
GLUCOSE: 91 mg/dL (ref 65–99)
Potassium: 4.7 mmol/L (ref 3.5–5.2)
SODIUM: 144 mmol/L (ref 134–144)

## 2018-03-25 LAB — CBC WITH DIFFERENTIAL/PLATELET
BASOS ABS: 0 10*3/uL (ref 0.0–0.2)
Basos: 0 %
EOS (ABSOLUTE): 0.1 10*3/uL (ref 0.0–0.4)
Eos: 2 %
HEMOGLOBIN: 12.4 g/dL — AB (ref 13.0–17.7)
Hematocrit: 38.5 % (ref 37.5–51.0)
IMMATURE GRANS (ABS): 0 10*3/uL (ref 0.0–0.1)
Immature Granulocytes: 0 %
LYMPHS: 45 %
Lymphocytes Absolute: 1.9 10*3/uL (ref 0.7–3.1)
MCH: 27.3 pg (ref 26.6–33.0)
MCHC: 32.2 g/dL (ref 31.5–35.7)
MCV: 85 fL (ref 79–97)
MONOCYTES: 7 %
Monocytes Absolute: 0.3 10*3/uL (ref 0.1–0.9)
Neutrophils Absolute: 1.9 10*3/uL (ref 1.4–7.0)
Neutrophils: 46 %
Platelets: 290 10*3/uL (ref 150–450)
RBC: 4.54 x10E6/uL (ref 4.14–5.80)
RDW: 15.7 % — ABNORMAL HIGH (ref 12.3–15.4)
WBC: 4.2 10*3/uL (ref 3.4–10.8)

## 2018-03-25 MED ORDER — BCG LIVE 50 MG IS SUSR
3.2400 mL | Freq: Once | INTRAVESICAL | Status: AC
  Administered 2018-03-25: 81 mg via INTRAVESICAL

## 2018-03-25 NOTE — Progress Notes (Signed)
BCG Bladder Instillation  BCG # 2  Due to Bladder Cancer patient is present today for a BCG treatment. Patient was cleaned and prepped in a sterile fashion with betadine and lidocaine 2% jelly was instilled into the urethra.  A 14FR catheter was inserted, urine return was noted 64ml, urine was yellow in color.  44ml of reconstituted BCG was instilled into the bladder. The catheter was then removed. Patient tolerated well, no complications were noted  Preformed by: Cristie Hem, CMA  Follow up/ Additional notes: 1/3 dose given

## 2018-03-27 LAB — CULTURE, URINE COMPREHENSIVE

## 2018-03-31 ENCOUNTER — Telehealth: Payer: Self-pay | Admitting: Urology

## 2018-03-31 NOTE — Telephone Encounter (Signed)
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 Ref # [id number] [place] 03/31/2018 9:56am

## 2018-04-01 ENCOUNTER — Ambulatory Visit (INDEPENDENT_AMBULATORY_CARE_PROVIDER_SITE_OTHER): Payer: Medicare HMO

## 2018-04-01 DIAGNOSIS — C679 Malignant neoplasm of bladder, unspecified: Secondary | ICD-10-CM | POA: Diagnosis not present

## 2018-04-01 LAB — URINALYSIS, COMPLETE
Bilirubin, UA: NEGATIVE
GLUCOSE, UA: NEGATIVE
Ketones, UA: NEGATIVE
Nitrite, UA: NEGATIVE
PROTEIN UA: NEGATIVE
RBC, UA: NEGATIVE
Specific Gravity, UA: 1.015 (ref 1.005–1.030)
UUROB: 1 mg/dL (ref 0.2–1.0)
pH, UA: 6 (ref 5.0–7.5)

## 2018-04-01 LAB — MICROSCOPIC EXAMINATION
Bacteria, UA: NONE SEEN
RBC, UA: NONE SEEN /hpf (ref 0–2)

## 2018-04-01 MED ORDER — BCG LIVE 50 MG IS SUSR
3.2400 mL | Freq: Once | INTRAVESICAL | Status: AC
  Administered 2018-04-01: 81 mg via INTRAVESICAL

## 2018-04-01 NOTE — Progress Notes (Signed)
BCG Bladder Instillation  BCG # 3  Due to Bladder Cancer patient is present today for a BCG treatment. Patient was cleaned and prepped in a sterile fashion with betadine and lidocaine 2% jelly was instilled into the urethra.  A 14FR catheter was inserted, urine return was noted 25ml, urine was yellow in color.  42ml of reconstituted BCG was instilled into the bladder. The catheter was then removed. Patient tolerated well, no complications were noted  Preformed by: Cristie Hem, CMA  1/2 dose given   Follow up/ Additional notes: 3 months for cysto

## 2018-04-14 DIAGNOSIS — H401131 Primary open-angle glaucoma, bilateral, mild stage: Secondary | ICD-10-CM | POA: Diagnosis not present

## 2018-04-20 ENCOUNTER — Other Ambulatory Visit: Payer: Self-pay

## 2018-04-20 ENCOUNTER — Encounter
Admission: RE | Admit: 2018-04-20 | Discharge: 2018-04-20 | Disposition: A | Discharge status: Home / Self Care | Payer: Medicare HMO | Source: Ambulatory Visit | Attending: Urology | Admitting: Urology

## 2018-04-20 DIAGNOSIS — R9431 Abnormal electrocardiogram [ECG] [EKG]: Secondary | ICD-10-CM | POA: Insufficient documentation

## 2018-04-20 DIAGNOSIS — I10 Essential (primary) hypertension: Secondary | ICD-10-CM | POA: Diagnosis not present

## 2018-04-20 DIAGNOSIS — I447 Left bundle-branch block, unspecified: Secondary | ICD-10-CM | POA: Insufficient documentation

## 2018-04-20 DIAGNOSIS — Z01810 Encounter for preprocedural cardiovascular examination: Secondary | ICD-10-CM | POA: Insufficient documentation

## 2018-04-20 HISTORY — DX: Malignant (primary) neoplasm, unspecified: C80.1

## 2018-04-20 NOTE — Patient Instructions (Signed)
Your procedure is scheduled on: Wed. 9/25 Report to Day Surgery. To find out your arrival time please call [phone] between 1PM - 3PM on Tues 9/24.  Remember: Instructions that are not followed completely may result in serious medical risk,  up to and including death, or upon the discretion of your surgeon and anesthesiologist your  surgery may need to be rescheduled.     _X__ 1. Do not eat food after midnight the night before your procedure.                 No gum chewing or hard candies. You may drink clear liquids up to 2 hours                 before you are scheduled to arrive for your surgery- DO not drink clear                 liquids within 2 hours of the start of your surgery.                 Clear Liquids include:  water, apple juice without pulp, clear carbohydrate                 drink such as Clearfast of Gatorade, Black Coffee or Tea (Do not add                 anything to coffee or tea).  __X__2.  On the morning of surgery brush your teeth with toothpaste and water, you                may rinse your mouth with mouthwash if you wish.  Do not swallow any toothpaste of mouthwash.     _X__ 3.  No Alcohol for 24 hours before or after surgery.   ___ 4.  Do Not Smoke or use e-cigarettes For 24 Hours Prior to Your Surgery.                 Do not use any chewable tobacco products for at least 6 hours prior to                 surgery.  ____  5.  Bring all medications with you on the day of surgery if instructed.   __x__  6.  Notify your doctor if there is any change in your medical condition      (cold, fever, infections).     Do not wear jewelry, make-up, hairpins, clips or nail polish. Do not wear lotions, powders, or perfumes. You may wear deodorant. Do not shave 48 hours prior to surgery. Men may shave face and neck. Do not bring valuables to the hospital.    [facility] is not responsible for any belongings or valuables.  Contacts, dentures  or bridgework may not be worn into surgery. Leave your suitcase in the car. After surgery it may be brought to your room. For patients admitted to the hospital, discharge time is determined by your treatment team.   Patients discharged the day of surgery will not be allowed to drive home.   Please read over the following fact sheets that you were given:   x____ Take these medicines the morning of surgery with A SIP OF WATER:    1. timolol (TIMOPTIC) 0.5 % ophthalmic solution  2. amLODipine (NORVASC) 5 MG tablet  3.   4.  5.  6.  ____ Fleet Enema (as directed)   ____ Use CHG Soap as directed  ____ Use inhalers on the day of surgery  ____ Stop metformin 2 days prior to surgery    ____ Take 1/2 of usual insulin dose the night before surgery. No insulin the morning          of surgery.   __x__ Stop aspirin tomorrow  __x__ Stop Anti-inflammatories on tomorrow    Tylenol IK   ____ Stop supplements until after surgery.    ____ Bring C-Pap to the hospital.

## 2018-04-27 MED ORDER — CEFAZOLIN SODIUM-DEXTROSE 2-4 GM/100ML-% IV SOLN
2.0000 g | INTRAVENOUS | Status: AC
  Administered 2018-04-28: 2 g via INTRAVENOUS

## 2018-04-28 ENCOUNTER — Ambulatory Visit
Admission: RE | Admit: 2018-04-28 | Discharge: 2018-04-28 | Disposition: A | Discharge status: Home / Self Care | Payer: Medicare HMO | Source: Ambulatory Visit | Attending: Urology | Admitting: Urology

## 2018-04-28 ENCOUNTER — Ambulatory Visit: Payer: Medicare HMO | Admitting: Certified Registered"

## 2018-04-28 ENCOUNTER — Encounter
Admission: RE | Disposition: A | Discharge status: Home / Self Care | Payer: Self-pay | Source: Ambulatory Visit | Attending: Urology

## 2018-04-28 ENCOUNTER — Other Ambulatory Visit: Payer: Self-pay

## 2018-04-28 DIAGNOSIS — Z7982 Long term (current) use of aspirin: Secondary | ICD-10-CM | POA: Diagnosis not present

## 2018-04-28 DIAGNOSIS — Z79899 Other long term (current) drug therapy: Secondary | ICD-10-CM | POA: Insufficient documentation

## 2018-04-28 DIAGNOSIS — Z8551 Personal history of malignant neoplasm of bladder: Secondary | ICD-10-CM | POA: Diagnosis not present

## 2018-04-28 DIAGNOSIS — Z87891 Personal history of nicotine dependence: Secondary | ICD-10-CM | POA: Insufficient documentation

## 2018-04-28 DIAGNOSIS — C679 Malignant neoplasm of bladder, unspecified: Secondary | ICD-10-CM | POA: Diagnosis not present

## 2018-04-28 DIAGNOSIS — D408 Neoplasm of uncertain behavior of other specified male genital organs: Secondary | ICD-10-CM | POA: Diagnosis not present

## 2018-04-28 DIAGNOSIS — N481 Balanitis: Secondary | ICD-10-CM | POA: Diagnosis not present

## 2018-04-28 DIAGNOSIS — H409 Unspecified glaucoma: Secondary | ICD-10-CM | POA: Diagnosis not present

## 2018-04-28 DIAGNOSIS — N476 Balanoposthitis: Secondary | ICD-10-CM | POA: Diagnosis not present

## 2018-04-28 DIAGNOSIS — I10 Essential (primary) hypertension: Secondary | ICD-10-CM | POA: Insufficient documentation

## 2018-04-28 HISTORY — PX: PENILE BIOPSY: SHX6013

## 2018-04-28 HISTORY — PX: CYSTOSCOPY: SHX5120

## 2018-04-28 SURGERY — CYSTOSCOPY
Anesthesia: General | Site: Penis

## 2018-04-28 MED ORDER — LIDOCAINE HCL (PF) 1 % IJ SOLN
INTRAMUSCULAR | Status: AC
  Filled 2018-04-28: qty 30

## 2018-04-28 MED ORDER — OXYCODONE HCL 5 MG PO TABS: 5.0000 mg | ORAL_TABLET | Freq: Once | ORAL | Status: DC | PRN

## 2018-04-28 MED ORDER — OXYCODONE HCL 5 MG/5ML PO SOLN: 5.0000 mg | Freq: Once | ORAL | Status: DC | PRN

## 2018-04-28 MED ORDER — FENTANYL CITRATE (PF) 100 MCG/2ML IJ SOLN
INTRAMUSCULAR | Status: DC | PRN
  Administered 2018-04-28 (×2): 25 ug via INTRAVENOUS
  Administered 2018-04-28: 50 ug via INTRAVENOUS

## 2018-04-28 MED ORDER — FAMOTIDINE 20 MG PO TABS
ORAL_TABLET | ORAL | Status: AC
  Filled 2018-04-28: qty 1

## 2018-04-28 MED ORDER — ONDANSETRON HCL 4 MG/2ML IJ SOLN
INTRAMUSCULAR | Status: AC
  Filled 2018-04-28: qty 2

## 2018-04-28 MED ORDER — FENTANYL CITRATE (PF) 100 MCG/2ML IJ SOLN: 25.0000 ug | INTRAMUSCULAR | Status: DC | PRN

## 2018-04-28 MED ORDER — PROPOFOL 10 MG/ML IV BOLUS
INTRAVENOUS | Status: AC
  Filled 2018-04-28: qty 20

## 2018-04-28 MED ORDER — LIDOCAINE HCL (PF) 2 % IJ SOLN
INTRAMUSCULAR | Status: AC
  Filled 2018-04-28: qty 10

## 2018-04-28 MED ORDER — ONDANSETRON HCL 4 MG/2ML IJ SOLN
INTRAMUSCULAR | Status: DC | PRN
  Administered 2018-04-28: 4 mg via INTRAVENOUS

## 2018-04-28 MED ORDER — CEFAZOLIN SODIUM-DEXTROSE 2-4 GM/100ML-% IV SOLN
INTRAVENOUS | Status: AC
  Filled 2018-04-28: qty 100

## 2018-04-28 MED ORDER — BACITRACIN ZINC 500 UNIT/GM EX OINT
TOPICAL_OINTMENT | CUTANEOUS | Status: AC
  Filled 2018-04-28: qty 28.35

## 2018-04-28 MED ORDER — LIDOCAINE HCL (CARDIAC) PF 100 MG/5ML IV SOSY
PREFILLED_SYRINGE | INTRAVENOUS | Status: DC | PRN
  Administered 2018-04-28: 100 mg via INTRAVENOUS

## 2018-04-28 MED ORDER — FAMOTIDINE 20 MG PO TABS
20.0000 mg | ORAL_TABLET | Freq: Once | ORAL | Status: AC
  Administered 2018-04-28: 20 mg via ORAL

## 2018-04-28 MED ORDER — LIDOCAINE HCL 1 % IJ SOLN
INTRAMUSCULAR | Status: DC | PRN
  Administered 2018-04-28: 10 mL

## 2018-04-28 MED ORDER — BUPIVACAINE HCL (PF) 0.5 % IJ SOLN
INTRAMUSCULAR | Status: AC
  Filled 2018-04-28: qty 30

## 2018-04-28 MED ORDER — PROPOFOL 10 MG/ML IV BOLUS
INTRAVENOUS | Status: DC | PRN
  Administered 2018-04-28: 170 mg via INTRAVENOUS

## 2018-04-28 MED ORDER — FENTANYL CITRATE (PF) 100 MCG/2ML IJ SOLN
INTRAMUSCULAR | Status: AC
  Filled 2018-04-28: qty 2

## 2018-04-28 MED ORDER — LACTATED RINGERS IV SOLN
INTRAVENOUS | Status: DC
  Administered 2018-04-28: 10:00:00 via INTRAVENOUS

## 2018-04-28 SURGICAL SUPPLY — 46 items
BAG DRAIN CYSTO-URO LG1000N (MISCELLANEOUS) ×2 IMPLANT
BLADE SURG 15 STRL LF DISP TIS (BLADE) ×2 IMPLANT
BLADE SURG 15 STRL SS (BLADE) ×4
BRUSH SCRUB EZ 1% IODOPHOR (MISCELLANEOUS) ×2 IMPLANT
CANISTER SUCT 1200ML W/VALVE (MISCELLANEOUS) ×4 IMPLANT
CATH URETL 5X70 OPEN END (CATHETERS) ×2 IMPLANT
CLOSURE WOUND 1/2 X4 (GAUZE/BANDAGES/DRESSINGS) ×1
DRAIN PENROSE 1/4X12 LTX (DRAIN) ×2 IMPLANT
DRAPE LAPAROTOMY 100X77 ABD (DRAPES) ×4 IMPLANT
DRSG GAUZE FLUFF 36X18 (GAUZE/BANDAGES/DRESSINGS) ×2 IMPLANT
DRSG TEGADERM 4X4.75 (GAUZE/BANDAGES/DRESSINGS) ×2 IMPLANT
DRSG TELFA 4X3 1S NADH ST (GAUZE/BANDAGES/DRESSINGS) ×4 IMPLANT
ELECT REM PT RETURN 9FT ADLT (ELECTROSURGICAL) ×4
ELECTRODE REM PT RTRN 9FT ADLT (ELECTROSURGICAL) ×2 IMPLANT
GLOVE BIO SURGEON STRL SZ 6.5 (GLOVE) ×3 IMPLANT
GLOVE BIO SURGEONS STRL SZ 6.5 (GLOVE) ×1
GLOVE INDICATOR 7.0 STRL GRN (GLOVE) ×4 IMPLANT
GOWN STRL REUS W/ TWL LRG LVL3 (GOWN DISPOSABLE) ×4 IMPLANT
GOWN STRL REUS W/TWL LRG LVL3 (GOWN DISPOSABLE) ×8
KIT TURNOVER KIT A (KITS) ×4 IMPLANT
LABEL OR SOLS (LABEL) ×4 IMPLANT
NS IRRIG 500ML POUR BTL (IV SOLUTION) ×4 IMPLANT
PACK BASIN MINOR ARMC (MISCELLANEOUS) ×4 IMPLANT
PACK CYSTO AR (MISCELLANEOUS) ×4 IMPLANT
SENSORWIRE 0.038 NOT ANGLED (WIRE)
SET CYSTO W/LG BORE CLAMP LF (SET/KITS/TRAYS/PACK) ×4 IMPLANT
SET INTRO CAPELLA COAXIAL (SET/KITS/TRAYS/PACK) ×2 IMPLANT
SOL .9 NS 3000ML IRR  AL (IV SOLUTION) ×2
SOL .9 NS 3000ML IRR AL (IV SOLUTION) ×2
SOL .9 NS 3000ML IRR UROMATIC (IV SOLUTION) ×2 IMPLANT
SOL PREP PVP 2OZ (MISCELLANEOUS) ×4
SOLUTION PREP PVP 2OZ (MISCELLANEOUS) ×2 IMPLANT
SPONGE KITTNER 5P (MISCELLANEOUS) ×2 IMPLANT
STRIP CLOSURE SKIN 1/2X4 (GAUZE/BANDAGES/DRESSINGS) ×3 IMPLANT
SUPPORT SCROTAL LRG (SOFTGOODS)
SUPPORT SCROTAL LRG NO STRP (SOFTGOODS) ×2 IMPLANT
SURGILUBE 2OZ TUBE FLIPTOP (MISCELLANEOUS) ×4 IMPLANT
SUT CHROMIC 3 0 SH 27 (SUTURE) ×4 IMPLANT
SUT CHROMIC 4 0 SH 27 (SUTURE) ×4 IMPLANT
SUT CHROMIC BR 1/2CLE 2-0 54IN (SUTURE) ×2 IMPLANT
SUT PLAIN 3 0 SH 27IN (SUTURE) ×2 IMPLANT
SUT SILK 2 0 SH (SUTURE) ×2 IMPLANT
SUT SURGILON NYL 2 0 T19 M (SUTURE) ×2 IMPLANT
SUT VIC AB 4-0 PS2 18 (SUTURE) ×2 IMPLANT
WATER STERILE IRR 1000ML POUR (IV SOLUTION) ×4 IMPLANT
WIRE SENSOR 0.038 NOT ANGLED (WIRE) ×4 IMPLANT

## 2018-04-28 NOTE — Anesthesia Preprocedure Evaluation (Signed)
Anesthesia Evaluation  Patient identified by MRN, date of birth, ID band Patient awake    Reviewed: Allergy & Precautions, H&P , NPO status , Patient's Chart, lab work & pertinent test results  History of Anesthesia Complications Negative for: history of anesthetic complications  Airway Mallampati: III  TM Distance: <3 FB Neck ROM: full    Dental  (+) Edentulous Upper, Edentulous Lower   Pulmonary neg shortness of breath, former smoker,           Cardiovascular Exercise Tolerance: Good hypertension, (-) angina(-) Past MI and (-) DOE      Neuro/Psych negative neurological ROS  negative psych ROS   GI/Hepatic negative GI ROS, Neg liver ROS, neg GERD  ,  Endo/Other  negative endocrine ROS  Renal/GU      Musculoskeletal   Abdominal   Peds  Hematology negative hematology ROS (+)   Anesthesia Other Findings Past Medical History: No date: Adenomatous polyps No date: Allergy 2017: Cancer ([place])     Comment:  bladder No date: ED (erectile dysfunction) No date: Glaucoma No date: Glucose intolerance (impaired glucose tolerance) No date: Hemorrhoids No date: Hypertension  Past Surgical History: 01/09/2017: TRANSURETHRAL RESECTION OF BLADDER TUMOR; N/A     Comment:  Procedure: TRANSURETHRAL RESECTION OF BLADDER TUMOR               (TURBT)-(MEDIUM);  Surgeon: Nickie Retort, MD;                Location: [facility] ORS;  Service: Urology;  Laterality: N/A;     Reproductive/Obstetrics negative OB ROS                             Anesthesia Physical Anesthesia Plan  ASA: II  Anesthesia Plan: General LMA   Post-op Pain Management:    Induction: Intravenous  PONV Risk Score and Plan: Ondansetron, Dexamethasone, Midazolam and Treatment may vary due to age or medical condition  Airway Management Planned: LMA  Additional Equipment:   Intra-op Plan:   Post-operative Plan: Extubation in  OR  Informed Consent: I have reviewed the patients History and Physical, chart, labs and discussed the procedure including the risks, benefits and alternatives for the proposed anesthesia with the patient or authorized representative who has indicated his/her understanding and acceptance.   Dental Advisory Given  Plan Discussed with: Anesthesiologist, CRNA and Surgeon  Anesthesia Plan Comments: (Patient consented for risks of anesthesia including but not limited to:  - adverse reactions to medications - damage to teeth, lips or other oral mucosa - sore throat or hoarseness - Damage to heart, brain, lungs or loss of life  Patient voiced understanding.)        Anesthesia Quick Evaluation

## 2018-04-28 NOTE — Op Note (Signed)
Date of procedure: 04/28/18  Preoperative diagnosis:  1. Penile erythema of foreskin/glans 2. History of bladder cancer  Postoperative diagnosis:  1. Same as above  Procedure:     1.  Penile glans biopsy 2.  Penile foreskin biopsy 3.  Penile block 4.  Flexible cystoscopy   Surgeon: Hollice Espy, MD  Anesthesia: General  Complications: None  Intraoperative findings: Erythema/hyperemia of right lateral glans and adjacent foreskin.  Bladder unremarkable.  EBL: Minimal  Specimens: Penile gland biopsy, penile foreskin biopsy  Drains: None  Indication: Matthew Carter is a 77 y.o. patient with personal history of bladder cancer on maintenance BCG and surveillance found to have irritation/erythematous plaque involving the glans and foreskin.  He was treated with antifungal steroid cream and the lesion improved but did not completely resolve and thus was counseled to undergo biopsy to rule out malignancy.  After reviewing the management options for treatment, he elected to proceed with the above surgical procedure(s). We have discussed the potential benefits and risks of the procedure, side effects of the proposed treatment, the likelihood of the patient achieving the goals of the procedure, and any potential problems that might occur during the procedure or recuperation. Informed consent has been obtained.  Description of procedure:  The patient was taken to the operating room and general anesthesia was induced.  The patient was placed in the supine position, prepped and draped in the usual sterile fashion, and preoperative antibiotics were administered. A preoperative time-out was performed.   The penis was carefully examined after reducing the foreskin.  This revealed some persistence erythema of the glans, nonspecific with poorly defined margin, approximately 1 cm involving the right lateral glands.  The adjacent foreskin measuring about 2 cm also had significant nonspecific  erythema.  There is no ulcerations or raised lesions appreciated.  Overall, his not particularly demarcated.  10 cc of 1% lidocaine was performed as a penile block for local anesthesia.  At this point time, an ellipse incision, approximately half centimeter on the glans was scored and then excised.  2 interrupted 3-0 chromic sutures were then placed for hemostasis.  This was passed off the field as specimen.  The adjacent foreskin was also biopsied using Metzenbaum scissors and ellipse type fashion, approximately 1 cm segment.  4 simple interrupted sutures were placed to bring together the skin for adequate hemostasis.  Past treatment ointment was then applied.  Next, given his history of bladder cancer and need for surveillance cystoscopy, we did go ahead and perform his flexible cystoscopy today.  A 16 French flexible scope was advanced per urethra into the bladder.  The urethra prostatic fossa were normal.  Upon entry to the bladder, there were no lesions, tumors, or ulcerations appreciated.  Bilateral ureteral orifices were visualized with clear reflux of urine from both.  This was otherwise unremarkable cystoscopy.  The scope was removed.  Bladder was drained using a 16 French red rubber.  The patient was then clean and dry, repositioned supine position, reversed anesthesia, taken to PACU stable condition.  Plan: Patient will be called with his pathology results.  He will follow-up in 3 months for continued surveillance cystoscopy.  Both he and his family are agreeable with this plan.  Hollice Espy, M.D.

## 2018-04-28 NOTE — Anesthesia Postprocedure Evaluation (Signed)
Anesthesia Post Note  Patient: Matthew Carter  Procedure(s) Performed: Flexible CYSTOSCOPY (N/A Bladder) PENILE BIOPSY (N/A Penis)  Patient location during evaluation: PACU Anesthesia Type: General Level of consciousness: awake and alert Pain management: pain level controlled Vital Signs Assessment: post-procedure vital signs reviewed and stable Respiratory status: spontaneous breathing, nonlabored ventilation, respiratory function stable and patient connected to nasal cannula oxygen Cardiovascular status: blood pressure returned to baseline and stable Postop Assessment: no apparent nausea or vomiting Anesthetic complications: no     Last Vitals:  Vitals:   04/28/18 1225 04/28/18 1240  BP: (!) 143/71 (!) 150/73  Pulse: 70 70  Resp: 16 16  Temp: (!) 36.1 C   SpO2: 98% 97%    Last Pain:  Vitals:   04/28/18 1240  TempSrc:   PainSc: 0-No pain                 Precious Haws Rishawn Walck

## 2018-04-28 NOTE — Discharge Instructions (Signed)
You have a few stitches in your foreskin and head of your penis will fall out on their own.  You may use bacitracin ointment twice daily to keep the incision lubricated.  If there is bleeding, please hold pressure.   You will be called with your pathology results.  Hollice Espy, MD   AMBULATORY SURGERY  DISCHARGE INSTRUCTIONS   1) The drugs that you were given will stay in your system until tomorrow so for the next 24 hours you should not:  A) Drive an automobile B) Make any legal decisions C) Drink any alcoholic beverage   2) You may resume regular meals tomorrow.  Today it is better to start with liquids and gradually work up to solid foods.  You may eat anything you prefer, but it is better to start with liquids, then soup and crackers, and gradually work up to solid foods.   3) Please notify your doctor immediately if you have any unusual bleeding, trouble breathing, redness and pain at the surgery site, drainage, fever, or pain not relieved by medication.    4) Additional Instructions:        Please contact your physician with any problems or Same Day Surgery at [phone], Monday through Friday 6 am to 4 pm, or [place] at [facility] number at [phone].

## 2018-04-28 NOTE — Anesthesia Procedure Notes (Signed)
Procedure Name: LMA Insertion Performed by: Lance Muss, CRNA Pre-anesthesia Checklist: Patient identified, Patient being monitored, Timeout performed, Emergency Drugs available and Suction available Patient Re-evaluated:Patient Re-evaluated prior to induction Oxygen Delivery Method: Circle system utilized Preoxygenation: Pre-oxygenation with 100% oxygen Induction Type: IV induction Ventilation: Mask ventilation without difficulty LMA: LMA inserted LMA Size: 5.0 Tube type: Oral Number of attempts: 2 Placement Confirmation: positive ETCO2 and breath sounds checked- equal and bilateral Tube secured with: Tape Dental Injury: Teeth and Oropharynx as per pre-operative assessment  Comments: Leak with 4.5 LMA, removed and placed 5.0 LMA. +ETCO2, +BBS.

## 2018-04-28 NOTE — Anesthesia Post-op Follow-up Note (Signed)
Anesthesia QCDR form completed.        

## 2018-04-28 NOTE — Transfer of Care (Signed)
Immediate Anesthesia Transfer of Care Note  Patient: Matthew Carter  Procedure(s) Performed: Flexible CYSTOSCOPY (N/A Bladder) PENILE BIOPSY (N/A Penis)  Patient Location: PACU  Anesthesia Type:General  Level of Consciousness: sedated  Airway & Oxygen Therapy: Patient Spontanous Breathing and Patient connected to face mask oxygen  Post-op Assessment: Report given to RN and Post -op Vital signs reviewed and stable  Post vital signs: Reviewed and stable  Last Vitals:  Vitals Value Taken Time  BP 143/61 04/28/2018 11:44 AM  Temp    Pulse 69 04/28/2018 11:45 AM  Resp 9 04/28/2018 11:45 AM  SpO2 96 % 04/28/2018 11:45 AM  Vitals shown include unvalidated device data.  Last Pain:  Vitals:   04/28/18 0940  TempSrc: Temporal  PainSc: 0-No pain         Complications: No apparent anesthesia complications

## 2018-04-28 NOTE — H&P (Signed)
03/24/2018  --> 04/28/2018 updated without change Patient is s/p maintenance BCG Will go ahead and cysto today for surviellence RRR CTAB   ZAFIR SCHAUER 1941/07/02 [id number]  Referring provider: Venia Carbon, MD [address] [place], [place] [address]     Chief Complaint  Patient presents with  . Follow-up    HPI: Patient is a 77 year old African American male with a history of bladder cancer and balanoposthitis who presents today for recheck of his penis.    It was noted on 02/16/2018 during his surveillance cystoscopy that there was significant erythema of coronal margin foreskin which is otherwise asymptomatic.  He was prescribed Mycolog cream for one month and instructed to return for a recheck.    He is using the cream three times daily.  He states he is using three different creams.  He cannot remember the names of the creams.  He has not noticed a difference.    His UA today is positive for 6-10 WBC's.       PMH:     Past Medical History:  Diagnosis Date  . Adenomatous polyps   . Allergy   . ED (erectile dysfunction)   . Glaucoma   . Glucose intolerance (impaired glucose tolerance)   . Hemorrhoids   . Hypertension     Surgical History:      Past Surgical History:  Procedure Laterality Date  . TRANSURETHRAL RESECTION OF BLADDER TUMOR N/A 01/09/2017   Procedure: TRANSURETHRAL RESECTION OF BLADDER TUMOR (TURBT)-(MEDIUM);  Surgeon: Nickie Retort, MD;  Location: [facility] ORS;  Service: Urology;  Laterality: N/A;    Home Medications:  Allergies as of 03/24/2018   No Known Allergies              Medication List            Accurate as of 03/24/18  9:37 AM. Always use your most recent med list.           amLODipine 5 MG tablet Commonly known as:  NORVASC TAKE 1 TABLET EVERY DAY FOR BLOOD PRESSURE   aspirin 81 MG tablet Take 81 mg by mouth daily.   latanoprost 0.005 % ophthalmic solution Commonly  known as:  XALATAN Place 1 drop into both eyes at bedtime.   loratadine 10 MG tablet Commonly known as:  CLARITIN Take 10 mg by mouth daily as needed for allergies.   nystatin-triamcinolone ointment Commonly known as:  MYCOLOG Apply 1 application topically 2 (two) times daily.   timolol 0.5 % ophthalmic solution Commonly known as:  TIMOPTIC Place 1 drop into both eyes daily.   triamcinolone ointment 0.1 % Commonly known as:  KENALOG APP TOPICALLY BID       Allergies: No Known Allergies  Family History:      Family History  Problem Relation Age of Onset  . GER disease Mother   . Hypertension Mother   . Kidney failure Mother   . Stroke Father   . Hypertension Father   . Cancer Paternal Grandfather        Leukemia  . Diabetes Paternal Grandfather   . Cancer Brother        throat cancer  . Colon cancer Neg Hx   . Prostate cancer Neg Hx   . Bladder Cancer Neg Hx     Social History:  reports that he quit smoking about 36 years ago. He has never used smokeless tobacco. He reports that he does not drink alcohol  or use drugs.  ROS: UROLOGY Frequent Urination?: No Hard to postpone urination?: No Burning/pain with urination?: No Get up at night to urinate?: Yes Leakage of urine?: No Urine stream starts and stops?: No Trouble starting stream?: No Do you have to strain to urinate?: No Blood in urine?: No Urinary tract infection?: No Sexually transmitted disease?: No Injury to kidneys or bladder?: No Painful intercourse?: No Weak stream?: No Erection problems?: No Penile pain?: No  Gastrointestinal Nausea?: No Vomiting?: No Indigestion/heartburn?: No Diarrhea?: No Constipation?: No  Constitutional Fever: No Night sweats?: No Weight loss?: No Fatigue?: No  Skin Skin rash/lesions?: No Itching?: No  Eyes Blurred vision?: No Double vision?: No  Ears/Nose/Throat Sore throat?: No Sinus problems?:  Yes  Hematologic/Lymphatic Swollen glands?: No Easy bruising?: No  Cardiovascular Leg swelling?: No Chest pain?: No  Respiratory Cough?: No Shortness of breath?: No  Endocrine Excessive thirst?: No  Musculoskeletal Back pain?: No Joint pain?: No  Neurological Headaches?: No Dizziness?: No  Psychologic Depression?: No Anxiety?: No  Physical Exam: BP (!) 160/78 (BP Location: Left Arm, Patient Position: Sitting, Cuff Size: Normal)   Pulse 89   Ht 5\' 11"  (1.803 m)   Wt 230 lb 3.2 oz (104.4 kg)   BMI 32.11 kg/m   Constitutional: Well nourished. Alert and oriented, No acute distress. HEENT: [place] AT, moist mucus membranes. Trachea midline, no masses. Cardiovascular: No clubbing, cyanosis, or edema. Respiratory: Normal respiratory effort, no increased work of breathing. GI: Abdomen is soft, non tender, non distended, no abdominal masses. Liver and spleen not palpable.  No hernias appreciated.  Stool sample for occult testing is not indicated.   GU: No CVA tenderness.  No bladder fullness or masses.  Patient with uncircumcised phallus.   Foreskin easily retracted.  No improvement of the erythema of the coronal margin of the foreskin and the glans.  It is "weepy" today when dried with paper towel.  Urethral meatus is patent.  No penile discharge. No penile lesions or rashes.  Rectal: Not performed Skin: No rashes, bruises or suspicious lesions. Lymph: No cervical or inguinal adenopathy. Neurologic: Grossly intact, no focal deficits, moving all 4 extremities. Psychiatric: Normal mood and affect.  Laboratory Data: RecentLabs       Lab Results  Component Value Date   WBC 5.1 01/04/2018   HGB 12.3 (L) 01/04/2018   HCT 37.3 (L) 01/04/2018   MCV 85.8 01/04/2018   PLT 262.0 01/04/2018      RecentLabs       Lab Results  Component Value Date   CREATININE 1.09 01/04/2018      RecentLabs       Lab Results  Component Value Date   PSA 2.97  11/14/2011   PSA 2.96 08/21/2009   PSA 1.92 08/18/2007      RecentLabs  No results found for: TESTOSTERONE    RecentLabs       Lab Results  Component Value Date   HGBA1C 6.3 09/06/2015      RecentLabs       Lab Results  Component Value Date   TSH 0.37 09/06/2015      Labs(Brief)          Component Value Date/Time   CHOL 161 11/29/2013 0936   HDL 43.10 11/29/2013 0936   CHOLHDL 4 11/29/2013 0936   VLDL 23.8 11/29/2013 0936   LDLCALC 94 11/29/2013 0936      RecentLabs       Lab Results  Component Value Date   AST 11  01/04/2018     RecentLabs       Lab Results  Component Value Date   ALT 8 01/04/2018     RecentLabs  No components found for: ALKALINEPHOPHATASE   RecentLabs  No components found for: BILIRUBINTOTAL    RecentLabs  No results found for: ESTRADIOL    Urinalysis Labs(Brief)          Component Value Date/Time   COLORURINE YELLOW 08/11/2016 0820   APPEARANCEUR Clear 03/18/2018 1406   LABSPEC 1.015 08/11/2016 0820   PHURINE 6.0 08/11/2016 0820   GLUCOSEU Negative 03/18/2018 1406   GLUCOSEU NEGATIVE 08/11/2016 0820   HGBUR SMALL (A) 08/11/2016 0820   BILIRUBINUR Negative 03/18/2018 1406   KETONESUR NEGATIVE 08/11/2016 0820   PROTEINUR Negative 03/18/2018 1406   UROBILINOGEN 0.2 11/12/2016 0929   UROBILINOGEN 0.2 08/11/2016 0820   NITRITE Negative 03/18/2018 1406   NITRITE NEGATIVE 08/11/2016 0820   LEUKOCYTESUR Negative 03/18/2018 1406      I have reviewed the labs.  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 Bladder cancer Return for BCG on August 22 and August 29 Return for surveillance cystoscopy on October 16  Return for Penile  biopsy in OR .  These notes generated with voice recognition software. I apologize for typographical errors.  Zara Council, PA-C  [facility] [address]  [place], [place] [address] [phone]

## 2018-04-29 ENCOUNTER — Ambulatory Visit (INDEPENDENT_AMBULATORY_CARE_PROVIDER_SITE_OTHER): Payer: Medicare HMO

## 2018-04-29 DIAGNOSIS — Z23 Encounter for immunization: Secondary | ICD-10-CM

## 2018-04-30 LAB — SURGICAL PATHOLOGY

## 2018-05-03 ENCOUNTER — Telehealth: Payer: Self-pay

## 2018-05-03 NOTE — Telephone Encounter (Signed)
Pt informed, will see you at next appointment.

## 2018-05-03 NOTE — Telephone Encounter (Signed)
-----   Message from Hollice Espy, MD sent at 04/30/2018  4:16 PM EDT ----- Juluis Rainier, there is no cancer in the biopsy specimen.  Pathology was consistent with an inflammatory skin condition.  This responsds well to topical steroid cream during flares but also often can be cured by circumcision.  We can discuss this further at your next appointment.  Great news.    Hollice Espy, MD

## 2018-05-12 ENCOUNTER — Ambulatory Visit: Payer: Self-pay | Admitting: Urology

## 2018-05-12 ENCOUNTER — Other Ambulatory Visit: Payer: Self-pay | Admitting: Urology

## 2018-05-19 ENCOUNTER — Other Ambulatory Visit: Payer: Self-pay | Admitting: Urology

## 2018-06-17 DIAGNOSIS — H401131 Primary open-angle glaucoma, bilateral, mild stage: Secondary | ICD-10-CM | POA: Diagnosis not present

## 2018-06-29 DIAGNOSIS — H524 Presbyopia: Secondary | ICD-10-CM | POA: Diagnosis not present

## 2018-06-29 DIAGNOSIS — H52223 Regular astigmatism, bilateral: Secondary | ICD-10-CM | POA: Diagnosis not present

## 2018-07-13 ENCOUNTER — Encounter: Payer: Self-pay | Admitting: Urology

## 2018-07-13 ENCOUNTER — Other Ambulatory Visit: Payer: Self-pay | Admitting: Urology

## 2018-07-13 ENCOUNTER — Telehealth: Payer: Self-pay | Admitting: Urology

## 2018-07-13 NOTE — Telephone Encounter (Signed)
This patient was a no-show for his cancer surveillance cystoscopy.  It is imperative that he follow-up.  Please call him to reschedule.  Hollice Espy, MD

## 2018-07-13 NOTE — Telephone Encounter (Signed)
Pt was a no show for his cysto.  Just F.Y.I.

## 2018-07-21 DIAGNOSIS — H401131 Primary open-angle glaucoma, bilateral, mild stage: Secondary | ICD-10-CM | POA: Diagnosis not present

## 2018-08-10 ENCOUNTER — Ambulatory Visit: Payer: Medicare HMO | Admitting: Urology

## 2018-08-10 ENCOUNTER — Encounter: Payer: Self-pay | Admitting: Urology

## 2018-08-10 VITALS — BP 184/80 | HR 84 | Ht 71.0 in | Wt 225.0 lb

## 2018-08-10 DIAGNOSIS — C679 Malignant neoplasm of bladder, unspecified: Secondary | ICD-10-CM

## 2018-08-10 DIAGNOSIS — N476 Balanoposthitis: Secondary | ICD-10-CM

## 2018-08-10 LAB — URINALYSIS, COMPLETE
Bilirubin, UA: NEGATIVE
GLUCOSE, UA: NEGATIVE
KETONES UA: NEGATIVE
NITRITE UA: NEGATIVE
PROTEIN UA: NEGATIVE
RBC UA: NEGATIVE
SPEC GRAV UA: 1.015 (ref 1.005–1.030)
UUROB: 0.2 mg/dL (ref 0.2–1.0)
pH, UA: 7 (ref 5.0–7.5)

## 2018-08-10 LAB — MICROSCOPIC EXAMINATION
Bacteria, UA: NONE SEEN
Epithelial Cells (non renal): NONE SEEN /hpf (ref 0–10)
RBC MICROSCOPIC, UA: NONE SEEN /HPF (ref 0–2)

## 2018-08-10 NOTE — Progress Notes (Signed)
  08/10/2018   CC:  Chief Complaint  Patient presents with  . Cysto    malignant neoplasm of urinary bladder    HPI: 78 year old male with personal history of bladder cancer status post TURBT 01/2017 with 3.5 cm right anterior bladder wall tumor.  Surgical pathology consistent with HG T1 TCC.  Lamina propria invasion was focal.  He declined repeat TURBT.    He underwent induction BCG x 6 04/2017 and more recently after his maintenance BCG completed 11/2017. Unfortunately, maintenance was unable to be continued in light of national backorder.   No urinary symptoms today. No gross hematuria.  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 Responded well to topical steroids. Asymptomatic.  He returns today for routine surveillance cystoscopy today.   Blood pressure (!) 184/80, pulse 84, height 5\' 11"  (1.803 m), weight 225 lb (102.1 kg). NED. A&Ox3.   No respiratory distress   Abd soft, NT, ND Uncircumcised phallus with slight erythema on the coronal margin, improved  Cystoscopy Procedure Note  Patient identification was confirmed, informed consent was obtained, and patient was prepped using Betadine solution.  Lidocaine jelly was administered per urethral meatus.    Preoperative abx where received prior to procedure.     Pre-Procedure: - Inspection reveals a normal caliber ureteral meatus.  Procedure: The flexible cystoscope was introduced without difficulty - No urethral strictures/lesions are present. - Enlarged prostate with trilobar coaptation - Normal bladder neck - Stellate scar on posterior bladder wall. Bladder mildly trabeculated. Several areas of non-specific ?hemociterine deposits without any obvious papillary tumor. Right lateral bladder wall one 0.5 cm area with texturized appearance ?early recurrence - Bilateral ureteral orifices identified - No bladder stones - Mild trabeculation  Retroflexion  unremarkable   Post-Procedure: - Patient tolerated the procedure well  Assessment/ Plan:  1. History of bladder cancer Right lateral bladder wall one 0.5 cm area with texturized apperaance ?early recurrence Several areas of non-specific ?  Hemosiderin deposits without any obvious papillary tumor.  Given history of high risk bladder cancer, would strongly recommend continuation of maintenance now that backorder has resolved  He is agreeable to repeat BCG x3 for maintenance and follow-up with cystoscopy in 3 months  Cysto in 3 months to follow area of possible recurrence closely.  - Urinalysis, Complete - ciprofloxacin (CIPRO) tablet 500 mg - lidocaine (XYLOCAINE) 2 % jelly 1 application  2. Balanoposthitis S/p penile biopsy (2019). Bengin Improving. Topical steroids as needed.   Return in about 3 months (around 11/09/2018) for bcg x3 now and cysto in 3 months.   Hollice Espy, MD   I, Stephania Fragmin , am acting as a scribe for Hollice Espy, MD  I have reviewed the above documentation for accuracy and completeness, and I agree with the above.   Hollice Espy, MD

## 2018-08-25 ENCOUNTER — Encounter: Payer: Self-pay | Admitting: Urology

## 2018-08-25 ENCOUNTER — Ambulatory Visit (INDEPENDENT_AMBULATORY_CARE_PROVIDER_SITE_OTHER): Payer: Medicare HMO | Admitting: Urology

## 2018-08-25 VITALS — BP 174/76 | HR 86 | Ht 71.0 in | Wt 231.0 lb

## 2018-08-25 DIAGNOSIS — R310 Gross hematuria: Secondary | ICD-10-CM

## 2018-08-25 DIAGNOSIS — C679 Malignant neoplasm of bladder, unspecified: Secondary | ICD-10-CM | POA: Diagnosis not present

## 2018-08-25 LAB — URINALYSIS, COMPLETE
Bilirubin, UA: NEGATIVE
GLUCOSE, UA: NEGATIVE
Ketones, UA: NEGATIVE
NITRITE UA: NEGATIVE
Protein, UA: NEGATIVE
RBC, UA: NEGATIVE
Specific Gravity, UA: 1.015 (ref 1.005–1.030)
UUROB: 1 mg/dL (ref 0.2–1.0)
pH, UA: 7 (ref 5.0–7.5)

## 2018-08-25 LAB — MICROSCOPIC EXAMINATION
BACTERIA UA: NONE SEEN
Epithelial Cells (non renal): NONE SEEN /hpf (ref 0–10)
RBC MICROSCOPIC, UA: NONE SEEN /HPF (ref 0–2)

## 2018-08-25 MED ORDER — BCG LIVE 50 MG IS SUSR
3.2400 mL | Freq: Once | INTRAVESICAL | Status: AC
  Administered 2018-08-25: 81 mg via INTRAVESICAL

## 2018-08-25 NOTE — Progress Notes (Signed)
BCG Bladder Instillation  BCG # Maintenance # 1  Due to Bladder Cancer patient is present today for a BCG treatment. Patient was cleaned and prepped in a sterile fashion with betadine and lidocaine 2% jelly was instilled into the urethra.  A 14 coudeFR catheter was inserted, urine return was noted 132ml, urine was yellow in color.  75ml of reconstituted BCG was instilled into the bladder. The catheter was then removed. Patient tolerated well, no complications were noted  Preformed by: Fonnie Jarvis, CMA and Shawnie Dapper, CMA  Follow up/ Additional notes: 1 wk

## 2018-08-25 NOTE — Patient Instructions (Signed)

## 2018-09-01 ENCOUNTER — Ambulatory Visit (INDEPENDENT_AMBULATORY_CARE_PROVIDER_SITE_OTHER): Payer: Medicare HMO | Admitting: Urology

## 2018-09-01 DIAGNOSIS — C679 Malignant neoplasm of bladder, unspecified: Secondary | ICD-10-CM

## 2018-09-01 LAB — URINALYSIS, COMPLETE
BILIRUBIN UA: NEGATIVE
GLUCOSE, UA: NEGATIVE
KETONES UA: NEGATIVE
Nitrite, UA: NEGATIVE
PROTEIN UA: NEGATIVE
SPEC GRAV UA: 1.015 (ref 1.005–1.030)
Urobilinogen, Ur: 0.2 mg/dL (ref 0.2–1.0)
pH, UA: 6.5 (ref 5.0–7.5)

## 2018-09-01 LAB — MICROSCOPIC EXAMINATION: Bacteria, UA: NONE SEEN

## 2018-09-01 MED ORDER — BCG LIVE 50 MG IS SUSR
3.2400 mL | Freq: Once | INTRAVESICAL | Status: AC
  Administered 2018-09-01: 81 mg via INTRAVESICAL

## 2018-09-01 NOTE — Progress Notes (Signed)
BCG Bladder Instillation  BCG # 2  Due to Bladder Cancer patient is present today for a BCG treatment. Patient was cleaned and prepped in a sterile fashion with betadine and lidocaine 2% jelly was instilled into the urethra.  A 14FR catheter was inserted, urine return was noted 133ml, urine was yellow in color.  21ml of reconstituted BCG was instilled into the bladder. The catheter was then removed. Patient tolerated well, no complications were noted  Preformed by: Shawnie Dapper, CMA, Fonnie Jarvis, CMA  Follow up/ Additional notes: as scheduled

## 2018-09-08 ENCOUNTER — Ambulatory Visit: Payer: Self-pay | Admitting: Urology

## 2018-09-28 ENCOUNTER — Ambulatory Visit (INDEPENDENT_AMBULATORY_CARE_PROVIDER_SITE_OTHER): Payer: Medicare HMO | Admitting: Urology

## 2018-09-28 DIAGNOSIS — Z8551 Personal history of malignant neoplasm of bladder: Secondary | ICD-10-CM | POA: Diagnosis not present

## 2018-09-28 LAB — URINALYSIS, COMPLETE
BILIRUBIN UA: NEGATIVE
Glucose, UA: NEGATIVE
KETONES UA: NEGATIVE
Nitrite, UA: NEGATIVE
Protein, UA: NEGATIVE
RBC UA: NEGATIVE
SPEC GRAV UA: 1.02 (ref 1.005–1.030)
Urobilinogen, Ur: 0.2 mg/dL (ref 0.2–1.0)
pH, UA: 6 (ref 5.0–7.5)

## 2018-09-28 LAB — MICROSCOPIC EXAMINATION: Bacteria, UA: NONE SEEN

## 2018-09-28 MED ORDER — BCG LIVE 50 MG IS SUSR
3.2400 mL | Freq: Once | INTRAVESICAL | Status: AC
  Administered 2018-09-28: 81 mg via INTRAVESICAL

## 2018-09-28 NOTE — Progress Notes (Signed)
BCG Bladder Instillation  BCG # 3  Due to Bladder Cancer patient is present today for a BCG treatment. Patient was cleaned and prepped in a sterile fashion with betadine and lidocaine 2% jelly was instilled into the urethra.  A 14FR catheter was inserted, urine return was noted 173ml, urine was clear yellow in color.  66ml of reconstituted BCG was instilled into the bladder. The catheter was then removed. Patient tolerated well, no complications were noted  Preformed by: Shawnie Dapper, CMA and Zara Council, PA-C  Follow up/ Additional notes: as scheduled

## 2018-10-07 ENCOUNTER — Encounter: Payer: Self-pay | Admitting: Internal Medicine

## 2018-12-07 ENCOUNTER — Other Ambulatory Visit: Payer: Medicare HMO | Admitting: Urology

## 2018-12-15 ENCOUNTER — Ambulatory Visit: Payer: Medicare HMO | Admitting: Urology

## 2018-12-15 ENCOUNTER — Encounter: Payer: Self-pay | Admitting: Urology

## 2018-12-15 ENCOUNTER — Other Ambulatory Visit: Payer: Self-pay

## 2018-12-15 VITALS — BP 184/73 | HR 84 | Ht 71.0 in | Wt 228.0 lb

## 2018-12-15 DIAGNOSIS — Z8551 Personal history of malignant neoplasm of bladder: Secondary | ICD-10-CM | POA: Diagnosis not present

## 2018-12-15 DIAGNOSIS — R319 Hematuria, unspecified: Secondary | ICD-10-CM | POA: Diagnosis not present

## 2018-12-15 LAB — URINALYSIS, COMPLETE
Bilirubin, UA: NEGATIVE
Glucose, UA: NEGATIVE
Ketones, UA: NEGATIVE
Nitrite, UA: NEGATIVE
Protein,UA: NEGATIVE
RBC, UA: NEGATIVE
Specific Gravity, UA: 1.02 (ref 1.005–1.030)
Urobilinogen, Ur: 0.2 mg/dL (ref 0.2–1.0)
pH, UA: 6 (ref 5.0–7.5)

## 2018-12-15 LAB — MICROSCOPIC EXAMINATION: Bacteria, UA: NONE SEEN

## 2018-12-15 NOTE — Progress Notes (Signed)
  12/15/2018   CC:  Chief Complaint  Patient presents with  . Cysto    HPI: 78 year old male with personal history of bladder cancer status post TURBT 01/2017 with 3.5 cm right anterior bladder wall tumor.  Surgical pathology consistent with HG T1 TCC.  Lamina propria invasion was focal.  He declined repeat TURBT.    He underwent induction BCG x 6 04/2017 and more recently after his maintenance BCG completed 11/2017. Unfortunately, maintenance was unable to be continued in light of national backorder.   No urinary symptoms today. No gross hematuria.  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 Responded well to topical steroids. Asymptomatic.  He returns today for routine surveillance cystoscopy today.   Blood pressure (!) 184/80, pulse 84, height 5\' 11"  (1.803 m), weight 225 lb (102.1 kg). NED. A&Ox3.   No respiratory distress   Abd soft, NT, ND Uncircumcised phallus with slight erythema on the coronal margin, improved  Cystoscopy Procedure Note  Patient identification was confirmed, informed consent was obtained, and patient was prepped using Betadine solution.  Lidocaine jelly was administered per urethral meatus.    Preoperative abx where received prior to procedure.     Pre-Procedure: - Inspection reveals a normal caliber ureteral meatus.  Procedure: The flexible cystoscope was introduced without difficulty - No urethral strictures/lesions are present. - Enlarged prostate with trilobar coaptation - Normal bladder neck - Stellate scar on posterior bladder wall. Bladder mildly trabeculated, no lesions or tumors - Bilateral ureteral orifices identified - No bladder stones - Mild trabeculation  Retroflexion unremarkable   Post-Procedure: - Patient tolerated the procedure well  Assessment/ Plan:  1. History of bladder cancer NED Cysto in 3 months Given his history of fairly high risk disease and no recent  upper tract imaging, I recommended a CT urogram in the interim, he is agreeable this plan I will call him with these results Plan for BCG again following neck cystoscopy, maintenance - Urinalysis, Complete - ciprofloxacin (CIPRO) tablet 500 mg - lidocaine (XYLOCAINE) 2 % jelly 1 application -CT hematuria work-up  Return in about 3 months (around 03/17/2019).   Hollice Espy, MD

## 2018-12-22 ENCOUNTER — Other Ambulatory Visit: Payer: Self-pay | Admitting: Urology

## 2019-01-07 ENCOUNTER — Encounter: Payer: Self-pay | Admitting: Internal Medicine

## 2019-01-07 ENCOUNTER — Ambulatory Visit (INDEPENDENT_AMBULATORY_CARE_PROVIDER_SITE_OTHER): Payer: Medicare HMO | Admitting: Internal Medicine

## 2019-01-07 VITALS — BP 170/62 | Wt 228.0 lb

## 2019-01-07 DIAGNOSIS — Z7189 Other specified counseling: Secondary | ICD-10-CM | POA: Diagnosis not present

## 2019-01-07 DIAGNOSIS — I10 Essential (primary) hypertension: Secondary | ICD-10-CM | POA: Diagnosis not present

## 2019-01-07 DIAGNOSIS — Z0000 Encounter for general adult medical examination without abnormal findings: Secondary | ICD-10-CM

## 2019-01-07 DIAGNOSIS — J301 Allergic rhinitis due to pollen: Secondary | ICD-10-CM

## 2019-01-07 DIAGNOSIS — H409 Unspecified glaucoma: Secondary | ICD-10-CM

## 2019-01-07 DIAGNOSIS — Z86010 Personal history of colonic polyps: Secondary | ICD-10-CM | POA: Diagnosis not present

## 2019-01-07 DIAGNOSIS — G250 Essential tremor: Secondary | ICD-10-CM

## 2019-01-07 DIAGNOSIS — Z8551 Personal history of malignant neoplasm of bladder: Secondary | ICD-10-CM

## 2019-01-07 MED ORDER — AMLODIPINE BESYLATE 10 MG PO TABS: 10.0000 mg | ORAL_TABLET | Freq: Every day | ORAL | 3 refills | Status: DC

## 2019-01-07 NOTE — Assessment & Plan Note (Signed)
Mostly in right hand Not bad enough for Rx (but could make beta blocker a good choice if needs another BP med)

## 2019-01-07 NOTE — Patient Instructions (Signed)
Please increase your amlodipine to 10mg  daily (take 2 of the 5mg  tabs daily till they run out--then the new prescription). Let me know if your blood pressure at home stays over 160/100---we should add more medication then.

## 2019-01-07 NOTE — Assessment & Plan Note (Signed)
See social history 

## 2019-01-07 NOTE — Assessment & Plan Note (Signed)
Does okay with loratadine

## 2019-01-07 NOTE — Progress Notes (Signed)
Subjective:    Patient ID: Matthew Carter, male    DOB: [birth year], 78 y.o.   MRN: [id number]  HPI Virtual visit for Medicare wellness and follow up of chronic health conditions Identification done Reviewed billing and he gave consent He is working at [employer] office and I am in my office  Continues with the urologist Done with Rx Getting regular cystoscopies---no recurrence Getting CT scan soon as well  BP has been up some Eating too much pork--bacon daily Plans to improve diet Not much exercise---walks occasionally but less than a mile Does garden 1.5 acres No chest pain No SOB or change in exercise tolerance No edema No palpitations  Continues on Rx for glaucoma Timolol now bid Vision remains okay  Some right hand tremor Not really bothersome  Some allergies from pollen Uses loratadine--satisfied with this  No hospitalizations this year Other doctors--- Dr Novella Olive, Dr Despina Pole No tobacco or alcohol Vision is fine Hearing is not great--got exam last week. Considering hearing aides Only some exercise No falls No depression or anhedonia--recent tough time with death of cousin Independent with instrumental ADLs No memory issues  Current Outpatient Medications on File Prior to Visit  Medication Sig Dispense Refill  . amLODipine (NORVASC) 5 MG tablet TAKE 1 TABLET EVERY DAY FOR BLOOD PRESSURE (Patient taking differently: Take 5 mg by mouth daily. TAKE 1 TABLET EVERY DAY FOR BLOOD PRESSURE) 90 tablet 3  . aspirin 81 MG tablet Take 81 mg by mouth daily.    Marland Kitchen latanoprost (XALATAN) 0.005 % ophthalmic solution Place 1 drop into both eyes at bedtime.     Marland Kitchen loratadine (CLARITIN) 10 MG tablet Take 10 mg by mouth daily as needed for allergies.    Marland Kitchen nystatin-triamcinolone ointment (MYCOLOG) Apply 1 application topically 2 (two) times daily. 30 g 0  . timolol (TIMOPTIC) 0.5 % ophthalmic solution Place 1 drop into both eyes daily.     Marland Kitchen triamcinolone  ointment (KENALOG) 0.1 % Apply 1 application topically 2 (two) times daily.   0   No current facility-administered medications on file prior to visit.     No Known Allergies  Past Medical History:  Diagnosis Date  . Adenomatous polyps   . Allergy   . Cancer ([place]) 2017   bladder  . ED (erectile dysfunction)   . Glaucoma   . Glucose intolerance (impaired glucose tolerance)   . Hemorrhoids   . Hypertension     Past Surgical History:  Procedure Laterality Date  . CYSTOSCOPY N/A 04/28/2018   Procedure: Flexible CYSTOSCOPY;  Surgeon: Hollice Espy, MD;  Location: [facility];  Service: Urology;  Laterality: N/A;  will be using flexible cystoscope  . PENILE BIOPSY N/A 04/28/2018   Procedure: PENILE BIOPSY;  Surgeon: Hollice Espy, MD;  Location: [facility];  Service: Urology;  Laterality: N/A;  . TRANSURETHRAL RESECTION OF BLADDER TUMOR N/A 01/09/2017   Procedure: TRANSURETHRAL RESECTION OF BLADDER TUMOR (TURBT)-(MEDIUM);  Surgeon: Nickie Retort, MD;  Location: [facility];  Service: Urology;  Laterality: N/A;    Family History  Problem Relation Age of Onset  . GER disease Mother   . Hypertension Mother   . Kidney failure Mother   . Stroke Father   . Hypertension Father   . Cancer Paternal Grandfather        Leukemia  . Diabetes Paternal Grandfather   . Cancer Brother        throat cancer  . Colon cancer Neg Hx   . Prostate cancer  Neg Hx   . Bladder Cancer Neg Hx     Social History   Socioeconomic History  . Marital status: Married    Spouse name: Not on file  . Number of children: 3  . Years of education: Not on file  . Highest education level: Not on file  Occupational History  . 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 Occupation: Surveyor, quantity    Comment: part time  . Occupation: Estate manager/land agent part time    Comment: [place]  . Financial resource strain: Not on file  . Food insecurity:    Worry: Not on file     Inability: Not on file  . Transportation needs:    Medical: Not on file    Non-medical: Not on file  Tobacco Use  . Smoking status: Former Smoker    Last attempt to quit: 08/04/1981    Years since quitting: 37.4  . Smokeless tobacco: Never Used  Substance and Sexual Activity  . Alcohol use: No    Alcohol/week: 0.0 standard drinks  . Drug use: No  . Sexual activity: Not on file  Lifestyle  . Physical activity:    Days per week: Not on file    Minutes per session: Not on file  . Stress: Not on file  Relationships  . Social connections:    Talks on phone: Not on file    Gets together: Not on file    Attends religious service: Not on file    Active member of club or organization: Not on file    Attends meetings of clubs or organizations: Not on file    Relationship status: Not on file  . Intimate partner violence:    Fear of current or ex partner: Not on file    Emotionally abused: Not on file    Physically abused: Not on file    Forced sexual activity: Not on file  Other Topics Concern  . Not on file  Social History Narrative   Has living will    No formal health care POA but requests wife--alternate is son   Would accept resuscitation attempts but no prolonged artificial ventilation   Would accept a feeding tube    Review of Systems Dentures--they are fine Appetite is fine Weight is about the same Wears seat belt Sleeps well Bowels fine--no blood Voids fine. Stream is good. Nocturia is usually once and rarely twice No skin rash or suspicious lesions No heartburn or dysphagia    Objective:   Physical Exam  Constitutional: He is oriented to person, place, and time. He appears well-developed. No distress.  Respiratory: Effort normal. No respiratory distress.  Musculoskeletal:        General: No edema.  Neurological: He is alert and oriented to person, place, and time.  President--- "Daisy Floro, Obama, Bush" [phone] [id number] Recall 3/3   Psychiatric: He has a normal mood and affect. His behavior is normal.           Assessment & Plan:

## 2019-01-07 NOTE — Assessment & Plan Note (Signed)
On Rx Vision okay

## 2019-01-07 NOTE — Assessment & Plan Note (Signed)
I have personally reviewed the Medicare Annual Wellness questionnaire and have noted 1. The patient's medical and social history 2. Their use of alcohol, tobacco or illicit drugs 3. Their current medications and supplements 4. The patient's functional ability including ADL's, fall risks, home safety risks and hearing or visual             impairment. 5. Diet and physical activities 6. Evidence for depression or mood disorders  The patients weight, height, BMI and visual acuity have been recorded in the chart I have made referrals, counseling and provided education to the patient based review of the above and I have provided the pt with a written personalized care plan for preventive services.  I have provided you with a copy of your personalized plan for preventive services. Please take the time to review along with your updated medication list.  Will set up last colonoscopy Discussed fitness He is going to improve his eating---less bacon Flu vaccine in the fall No PSA due to age

## 2019-01-07 NOTE — Assessment & Plan Note (Signed)
BP Readings from Last 3 Encounters:  01/07/19 (!) 170/62  12/15/18 (!) 184/73  08/25/18 (!) 174/76   Persistently elevated Will increase the amlodipine Consider HCTZ or beta blocker Set up for follow up and blood work in 2 months or so

## 2019-01-07 NOTE — Assessment & Plan Note (Signed)
Finished with BCG On surveillance cysto schedule

## 2019-01-07 NOTE — Assessment & Plan Note (Signed)
Will contact Dr Carlean Purl about another colon

## 2019-01-12 ENCOUNTER — Encounter: Payer: Self-pay | Admitting: Internal Medicine

## 2019-01-12 ENCOUNTER — Telehealth: Payer: Self-pay

## 2019-01-12 NOTE — Telephone Encounter (Signed)
Pt notified as instructed; pt voiced understanding and pt said he will take the amlodipine 5 mg taking 2 tabs daily until they are gone and then will start the amlodipine 10 mg taking one tab daily. Pt wanted Dr Silvio Pate to know that his wife took his BP when she got home and today BP is 154/77. FYI to Dr Silvio Pate.

## 2019-01-12 NOTE — Telephone Encounter (Signed)
Pt called to see if was to take amlodipine 5 mg and amlodipine 10 mg now. Advised pt per 01/07/19 appt note that pt was to take amlodipine 10 mg daily; pt could take amlodipine 5 mg taking 2 tabs daily or can take amlodipine 10 mg taking 1 tab po daily. Pt was not sure if he should take 10 mg daily or 15 mg daily. I advised pt should take amlodipine 10 mg daily not 15 mg daily. Pt voiced understanding and said he just started taking the amlodipine 10 mg on 01/11/19 and his wife is not home so pt cannot take BP now. Advised pt per 01/07/19 visit if after starting amlodipine 10 mg daily pts BP is > 160/100 to let Dr Silvio Pate know because he may need to add more med. Pt voiced understanding and will cb with update. FYI to Dr Silvio Pate.

## 2019-01-12 NOTE — Telephone Encounter (Signed)
That is correct Just 10mg  daily (however he gets it with the pills---should just finish out the 5mg  at 2 a day then switch)

## 2019-01-12 NOTE — Progress Notes (Signed)
Called and spoke to Matthew Carter. Scheduled his Colonoscopy for 02-25-2019 w/Dr Carlean Purl.

## 2019-01-13 NOTE — Telephone Encounter (Signed)
Matthew Carter We will continue to monitor

## 2019-01-14 ENCOUNTER — Other Ambulatory Visit: Payer: Self-pay

## 2019-01-14 ENCOUNTER — Ambulatory Visit
Admission: RE | Admit: 2019-01-14 | Discharge: 2019-01-14 | Disposition: A | Discharge status: Home / Self Care | Payer: Medicare HMO | Source: Ambulatory Visit | Attending: Urology | Admitting: Urology

## 2019-01-14 DIAGNOSIS — Z8551 Personal history of malignant neoplasm of bladder: Secondary | ICD-10-CM | POA: Diagnosis not present

## 2019-01-14 DIAGNOSIS — K8020 Calculus of gallbladder without cholecystitis without obstruction: Secondary | ICD-10-CM | POA: Diagnosis not present

## 2019-01-14 DIAGNOSIS — N400 Enlarged prostate without lower urinary tract symptoms: Secondary | ICD-10-CM | POA: Diagnosis not present

## 2019-01-14 LAB — POCT I-STAT CREATININE: Creatinine, Ser: 1.1 mg/dL (ref 0.61–1.24)

## 2019-01-14 MED ORDER — IOHEXOL 300 MG/ML  SOLN
150.0000 mL | Freq: Once | INTRAMUSCULAR | Status: AC | PRN
  Administered 2019-01-14: 150 mL via INTRAVENOUS

## 2019-01-17 ENCOUNTER — Telehealth: Payer: Self-pay

## 2019-01-17 NOTE — Telephone Encounter (Signed)
-----   Message from Hollice Espy, MD sent at 01/16/2019  1:43 PM EDT ----- Please let this patient know that his CT urogram looks good, no GU issues.  Please ensure that he scheduled for BCG x3 and then a 53-month cystoscopy thereafter with me.  Hollice Espy, MD

## 2019-01-17 NOTE — Telephone Encounter (Signed)
Patient notified and scheduled for appointments

## 2019-01-18 DIAGNOSIS — H401131 Primary open-angle glaucoma, bilateral, mild stage: Secondary | ICD-10-CM | POA: Diagnosis not present

## 2019-01-20 ENCOUNTER — Encounter: Payer: Self-pay | Admitting: Urology

## 2019-01-22 ENCOUNTER — Other Ambulatory Visit: Payer: Self-pay | Admitting: Internal Medicine

## 2019-01-24 NOTE — Telephone Encounter (Signed)
Left message on VM asking pt if he wanted this at [facility] as requesting or stay at [facility] as it was done 01-07-19.

## 2019-02-10 ENCOUNTER — Encounter: Payer: Self-pay | Admitting: Urology

## 2019-02-18 ENCOUNTER — Ambulatory Visit: Payer: Self-pay

## 2019-02-25 ENCOUNTER — Ambulatory Visit (INDEPENDENT_AMBULATORY_CARE_PROVIDER_SITE_OTHER): Payer: Medicare HMO | Admitting: *Deleted

## 2019-02-25 ENCOUNTER — Encounter: Payer: Medicare HMO | Admitting: Internal Medicine

## 2019-02-25 ENCOUNTER — Other Ambulatory Visit: Payer: Self-pay

## 2019-02-25 DIAGNOSIS — Z8551 Personal history of malignant neoplasm of bladder: Secondary | ICD-10-CM | POA: Diagnosis not present

## 2019-02-25 LAB — URINALYSIS, COMPLETE
Bilirubin, UA: NEGATIVE
Glucose, UA: NEGATIVE
Ketones, UA: NEGATIVE
Nitrite, UA: NEGATIVE
Protein,UA: NEGATIVE
Specific Gravity, UA: 1.015 (ref 1.005–1.030)
Urobilinogen, Ur: 1 mg/dL (ref 0.2–1.0)
pH, UA: 6 (ref 5.0–7.5)

## 2019-02-25 LAB — MICROSCOPIC EXAMINATION: Bacteria, UA: NONE SEEN

## 2019-02-25 MED ORDER — BCG LIVE 50 MG IS SUSR
3.2400 mL | Freq: Once | INTRAVESICAL | Status: AC
  Administered 2019-02-25: 81 mg via INTRAVESICAL

## 2019-02-25 NOTE — Progress Notes (Signed)
BCG Bladder Instillation  BCG # 4  Due to Bladder Cancer patient is present today for a BCG treatment. Patient was cleaned and prepped in a sterile fashion with betadine and lidocaine 2% jelly was instilled into the urethra.  A 14FR catheter was inserted, urine return was noted 74ml, urine was yellow in color.  61ml of reconstituted BCG was instilled into the bladder. The catheter was then removed. Patient tolerated well, no complications were noted  Preformed by: Verlene Mayer, CMA and Jacinto Halim  Follow up/ Additional notes: As scheduled

## 2019-03-03 ENCOUNTER — Encounter: Payer: Self-pay | Admitting: Urology

## 2019-03-04 ENCOUNTER — Ambulatory Visit: Payer: Self-pay

## 2019-03-10 ENCOUNTER — Ambulatory Visit (INDEPENDENT_AMBULATORY_CARE_PROVIDER_SITE_OTHER): Payer: Medicare HMO | Admitting: *Deleted

## 2019-03-10 ENCOUNTER — Other Ambulatory Visit: Payer: Self-pay

## 2019-03-10 VITALS — Ht 71.0 in

## 2019-03-10 DIAGNOSIS — C679 Malignant neoplasm of bladder, unspecified: Secondary | ICD-10-CM | POA: Diagnosis not present

## 2019-03-10 LAB — MICROSCOPIC EXAMINATION: Bacteria, UA: NONE SEEN

## 2019-03-10 LAB — URINALYSIS, COMPLETE
Bilirubin, UA: NEGATIVE
Glucose, UA: NEGATIVE
Ketones, UA: NEGATIVE
Nitrite, UA: NEGATIVE
Protein,UA: NEGATIVE
Specific Gravity, UA: 1.02 (ref 1.005–1.030)
Urobilinogen, Ur: 1 mg/dL (ref 0.2–1.0)
pH, UA: 6.5 (ref 5.0–7.5)

## 2019-03-10 MED ORDER — BCG LIVE 50 MG IS SUSR
3.2400 mL | Freq: Once | INTRAVESICAL | Status: AC
  Administered 2019-03-10: 81 mg via INTRAVESICAL

## 2019-03-10 NOTE — Progress Notes (Signed)
BCG Bladder Instillation  BCG # 5  Due to Bladder Cancer patient is present today for a BCG treatment. Patient was cleaned and prepped in a sterile fashion with betadine and lidocaine 2% jelly was instilled into the urethra.  A 14FR catheter was inserted, urine return was noted 42ml, urine was yellow in color.  25ml of reconstituted BCG was instilled into the bladder. The catheter was then removed. Patient tolerated well, no complications were noted  Preformed by: Verlene Mayer, CMA and Debroah Loop, PA  Follow up/ Additional notes: As scheduled

## 2019-03-11 ENCOUNTER — Ambulatory Visit: Payer: Self-pay

## 2019-03-11 ENCOUNTER — Ambulatory Visit: Payer: Medicare HMO | Admitting: Internal Medicine

## 2019-03-14 ENCOUNTER — Other Ambulatory Visit: Payer: Self-pay

## 2019-03-14 ENCOUNTER — Encounter: Payer: Self-pay | Admitting: Internal Medicine

## 2019-03-14 ENCOUNTER — Ambulatory Visit (INDEPENDENT_AMBULATORY_CARE_PROVIDER_SITE_OTHER): Payer: Medicare HMO | Admitting: Internal Medicine

## 2019-03-14 VITALS — BP 120/70 | HR 77 | Temp 98.1°F | Ht 71.0 in | Wt 225.0 lb

## 2019-03-14 DIAGNOSIS — I10 Essential (primary) hypertension: Secondary | ICD-10-CM

## 2019-03-14 DIAGNOSIS — C679 Malignant neoplasm of bladder, unspecified: Secondary | ICD-10-CM

## 2019-03-14 NOTE — Progress Notes (Signed)
Subjective:    Patient ID: Matthew Carter, male    DOB: Mar 09, 1941, 78 y.o.   MRN: [id number]  HPI Here for follow up of his BP Amlodipine increased to 10mg  He has tolerated this fine No edema---but does have occasional numb feeling (not new)  No chest pain No SOB No dizziness or syncope He monitors his BP ---it has been fine at home  Continues with the urologist Still gets BCG instillations for bladder with episodic cystoscopies  Current Outpatient Medications on File Prior to Visit  Medication Sig Dispense Refill  . amLODipine (NORVASC) 10 MG tablet Take 1 tablet (10 mg total) by mouth daily. 90 tablet 3  . aspirin 81 MG tablet Take 81 mg by mouth daily.    Marland Kitchen latanoprost (XALATAN) 0.005 % ophthalmic solution Place 1 drop into both eyes at bedtime.     Marland Kitchen loratadine (CLARITIN) 10 MG tablet Take 10 mg by mouth daily as needed for allergies.    Marland Kitchen timolol (TIMOPTIC) 0.5 % ophthalmic solution Place 1 drop into both eyes daily.      No current facility-administered medications on file prior to visit.     No Known Allergies  Past Medical History:  Diagnosis Date  . Adenomatous polyps   . Allergy   . Cancer ([place]) 2017   bladder  . ED (erectile dysfunction)   . Glaucoma   . Glucose intolerance (impaired glucose tolerance)   . Hemorrhoids   . Hypertension     Past Surgical History:  Procedure Laterality Date  . CYSTOSCOPY N/A 04/28/2018   Procedure: Flexible CYSTOSCOPY;  Surgeon: Hollice Espy, MD;  Location: [facility];  Service: Urology;  Laterality: N/A;  will be using flexible cystoscope  . PENILE BIOPSY N/A 04/28/2018   Procedure: PENILE BIOPSY;  Surgeon: Hollice Espy, MD;  Location: [facility];  Service: Urology;  Laterality: N/A;  . TRANSURETHRAL RESECTION OF BLADDER TUMOR N/A 01/09/2017   Procedure: TRANSURETHRAL RESECTION OF BLADDER TUMOR (TURBT)-(MEDIUM);  Surgeon: Nickie Retort, MD;  Location: [facility];  Service: Urology;  Laterality: N/A;    Family  History  Problem Relation Age of Onset  . GER disease Mother   . Hypertension Mother   . Kidney failure Mother   . Stroke Father   . Hypertension Father   . Cancer Paternal Grandfather        Leukemia  . Diabetes Paternal Grandfather   . Cancer Brother        throat cancer  . Colon cancer Neg Hx   . Prostate cancer Neg Hx   . Bladder Cancer Neg Hx     Social History   Socioeconomic History  . Marital status: Married    Spouse name: Not on file  . Number of children: 3  . Years of education: Not on file  . Highest education level: Not on file  Occupational History  . 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 Occupation: Surveyor, quantity    Comment: part time  . Occupation: Estate manager/land agent part time    Comment: [place]  . Financial resource strain: Not on file  . Food insecurity    Worry: Not on file    Inability: Not on file  . Transportation needs    Medical: Not on file    Non-medical: Not on file  Tobacco Use  . Smoking status: Former Smoker    Quit date: 08/04/1981    Years since quitting: 37.6  . Smokeless  tobacco: Never Used  Substance and Sexual Activity  . Alcohol use: No    Alcohol/week: 0.0 standard drinks  . Drug use: No  . Sexual activity: Not on file  Lifestyle  . Physical activity    Days per week: Not on file    Minutes per session: Not on file  . Stress: Not on file  Relationships  . Social Herbalist on phone: Not on file    Gets together: Not on file    Attends religious service: Not on file    Active member of club or organization: Not on file    Attends meetings of clubs or organizations: Not on file    Relationship status: Not on file  . Intimate partner violence    Fear of current or ex partner: Not on file    Emotionally abused: Not on file    Physically abused: Not on file    Forced sexual activity: Not on file  Other Topics Concern  . Not on file  Social History Narrative    Has living will    No formal health care POA but requests wife--alternate is son   Would accept resuscitation attempts but no prolonged artificial ventilation   Would accept a feeding tube   Review of Systems  Appetite is good Has cut back on bacon and ham     Objective:   Physical Exam  Constitutional: He appears well-developed. No distress.  Neck: No thyromegaly present.  Cardiovascular: Normal rate, regular rhythm and normal heart sounds. Exam reveals no gallop.  No murmur heard. Respiratory: Effort normal and breath sounds normal. No respiratory distress. He has no wheezes. He has no rales.  Musculoskeletal:        General: No edema.  Lymphadenopathy:    He has no cervical adenopathy.           Assessment & Plan:

## 2019-03-14 NOTE — Assessment & Plan Note (Signed)
BP Readings from Last 3 Encounters:  03/14/19 120/70  01/07/19 (!) 170/62  12/15/18 (!) 184/73   Much better now Will continue the higher amlodipine Check labs

## 2019-03-14 NOTE — Assessment & Plan Note (Signed)
Still getting BCG and regular cystoscopies

## 2019-03-15 LAB — CBC
HCT: 36.6 % — ABNORMAL LOW (ref 39.0–52.0)
Hemoglobin: 11.9 g/dL — ABNORMAL LOW (ref 13.0–17.0)
MCHC: 32.6 g/dL (ref 30.0–36.0)
MCV: 86.3 fl (ref 78.0–100.0)
Platelets: 286 10*3/uL (ref 150.0–400.0)
RBC: 4.25 Mil/uL (ref 4.22–5.81)
RDW: 14.7 % (ref 11.5–15.5)
WBC: 5.5 10*3/uL (ref 4.0–10.5)

## 2019-03-15 LAB — COMPREHENSIVE METABOLIC PANEL
ALT: 9 U/L (ref 0–53)
AST: 12 U/L (ref 0–37)
Albumin: 3.7 g/dL (ref 3.5–5.2)
Alkaline Phosphatase: 83 U/L (ref 39–117)
BUN: 17 mg/dL (ref 6–23)
CO2: 30 mEq/L (ref 19–32)
Calcium: 9.4 mg/dL (ref 8.4–10.5)
Chloride: 102 mEq/L (ref 96–112)
Creatinine, Ser: 1.38 mg/dL (ref 0.40–1.50)
GFR: 60.53 mL/min (ref >60.00)
Glucose, Bld: 94 mg/dL (ref 70–99)
Potassium: 4.2 mEq/L (ref 3.5–5.1)
Sodium: 138 mEq/L (ref 135–145)
Total Bilirubin: 0.3 mg/dL (ref 0.2–1.2)
Total Protein: 6.8 g/dL (ref 6.0–8.3)

## 2019-03-18 ENCOUNTER — Ambulatory Visit: Payer: Medicare HMO

## 2019-03-28 ENCOUNTER — Telehealth: Payer: Self-pay

## 2019-03-28 MED ORDER — AMLODIPINE BESYLATE 10 MG PO TABS: 10.0000 mg | ORAL_TABLET | Freq: Every day | ORAL | 3 refills | Status: DC

## 2019-03-28 NOTE — Telephone Encounter (Signed)
Rx sent electronically.  

## 2019-03-28 NOTE — Telephone Encounter (Signed)
[place] left v.m requesting status of amlodipine 10 mg 90 day supply previously requested; request cb using Ref # [id number] or escribe to [place].

## 2019-04-05 ENCOUNTER — Ambulatory Visit (INDEPENDENT_AMBULATORY_CARE_PROVIDER_SITE_OTHER): Payer: Medicare HMO

## 2019-04-05 DIAGNOSIS — Z23 Encounter for immunization: Secondary | ICD-10-CM | POA: Diagnosis not present

## 2019-05-17 ENCOUNTER — Ambulatory Visit (INDEPENDENT_AMBULATORY_CARE_PROVIDER_SITE_OTHER): Payer: Medicare HMO | Admitting: Urology

## 2019-05-17 ENCOUNTER — Encounter: Payer: Self-pay | Admitting: Urology

## 2019-05-17 ENCOUNTER — Other Ambulatory Visit: Payer: Self-pay

## 2019-05-17 VITALS — BP 180/75 | HR 87 | Ht 71.0 in | Wt 227.0 lb

## 2019-05-17 DIAGNOSIS — C679 Malignant neoplasm of bladder, unspecified: Secondary | ICD-10-CM

## 2019-05-17 LAB — URINALYSIS, COMPLETE
Bilirubin, UA: NEGATIVE
Glucose, UA: NEGATIVE
Ketones, UA: NEGATIVE
Nitrite, UA: NEGATIVE
Protein,UA: NEGATIVE
RBC, UA: NEGATIVE
Specific Gravity, UA: 1.015 (ref 1.005–1.030)
Urobilinogen, Ur: 1 mg/dL (ref 0.2–1.0)
pH, UA: 6 (ref 5.0–7.5)

## 2019-05-17 LAB — MICROSCOPIC EXAMINATION
Bacteria, UA: NONE SEEN
RBC: NONE SEEN /hpf (ref 0–2)

## 2019-05-17 NOTE — Progress Notes (Signed)
  05/17/2019   CC:  Chief Complaint  Patient presents with  . Cysto    HPI: 78 year old male with personal history of bladder cancer status post TURBT 01/2017 with 3.5 cm right anterior bladder wall tumor.  Surgical pathology consistent with HG T1 TCC.  Lamina propria invasion was focal.  He declined repeat TURBT.    He underwent induction BCG x 6 04/2017 and more recently after his maintenance BCG completed 11/2017. Unfortunately, maintenance was unable to be continued in light of national backorder.   More recently, he completed BCG x2 (missed third dose for some reason)  03/2019.  Restaging imaging in the form of CT urogram on 01/14/2019 showed mild diffuse bladder wall thickening but no evidence of GU malignancy.  No urinary symptoms today. No gross hematuria.  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 Responded well to topical steroids. Asymptomatic.  He returns today for routine surveillance cystoscopy today.   Blood pressure (!) 184/80, pulse 84, height 5\' 11"  (1.803 m), weight 225 lb (102.1 kg). NED. A&Ox3.   No respiratory distress   Abd soft, NT, ND Uncircumcised phallus with slight erythema on the coronal margin, improved  Cystoscopy Procedure Note  Patient identification was confirmed, informed consent was obtained, and patient was prepped using Betadine solution.  Lidocaine jelly was administered per urethral meatus.    Preoperative abx where received prior to procedure.     Pre-Procedure: - Inspection reveals a normal caliber ureteral meatus.  Procedure: The flexible cystoscope was introduced without difficulty - No urethral strictures/lesions are present. - Enlarged prostate with trilobar coaptation - Normal bladder neck - Stellate scar on posterior bladder wall. Bladder mildly trabeculated, no lesions or tumors - Bilateral ureteral orifices identified - No bladder stones - Mild trabeculation   Retroflexion unremarkable   Post-Procedure: - Patient tolerated the procedure well  Assessment/ Plan:  1. History of bladder cancer NED Cysto in 6 months We will consider repeating maintenance BCG following neck cystoscopy - Urinalysis, Complete - ciprofloxacin (CIPRO) tablet 500 mg - lidocaine (XYLOCAINE) 2 % jelly 1 application -CT hematuria work-up  Return in about 6 months (around 11/15/2019) for cysto.   Hollice Espy, MD

## 2019-05-18 DIAGNOSIS — H401131 Primary open-angle glaucoma, bilateral, mild stage: Secondary | ICD-10-CM | POA: Diagnosis not present

## 2019-06-21 DIAGNOSIS — H401112 Primary open-angle glaucoma, right eye, moderate stage: Secondary | ICD-10-CM | POA: Diagnosis not present

## 2019-07-19 ENCOUNTER — Telehealth: Payer: Self-pay | Admitting: Internal Medicine

## 2019-07-19 MED ORDER — AMLODIPINE BESYLATE 10 MG PO TABS: 10.0000 mg | ORAL_TABLET | Freq: Every day | ORAL | 0 refills | Status: DC

## 2019-07-19 NOTE — Telephone Encounter (Signed)
Spoke to pt. 7 day rx sent to [facility].

## 2019-07-19 NOTE — Telephone Encounter (Signed)
Patient called.  Patient said [employer] said patient won't receive his Amlodipine for 5-7 days.  Patient needs a 7 day rx sent to [facility] by Kristopher Oppenheim to get him by until he receives the mail order.

## 2019-07-26 ENCOUNTER — Telehealth: Payer: Self-pay | Admitting: Urology

## 2019-07-26 NOTE — Telephone Encounter (Signed)
Pt called office and inquired about getting the covid vaccine, pt wants to know if it would be okay to get the vaccine since he's had BCG treatments. Please advise pt. Pt states you can also respond via MyChart.

## 2019-08-01 NOTE — Telephone Encounter (Signed)
No know contraindications

## 2019-08-01 NOTE — Telephone Encounter (Signed)
Patient informed voiced understanding.

## 2019-08-02 DIAGNOSIS — H401121 Primary open-angle glaucoma, left eye, mild stage: Secondary | ICD-10-CM | POA: Diagnosis not present

## 2019-08-09 DIAGNOSIS — H01003 Unspecified blepharitis right eye, unspecified eyelid: Secondary | ICD-10-CM | POA: Diagnosis not present

## 2019-11-15 ENCOUNTER — Other Ambulatory Visit: Payer: Medicare HMO | Admitting: Urology

## 2019-12-06 NOTE — Progress Notes (Incomplete)
   12/07/19  CC: No chief complaint on file.   HPI: Matthew Carter is a 79 y.o. M with personal history of bladder cancer status post TURBT 01/2017 with 3.5 cm right anterior bladder wall tumor.  Surgical pathology consistent with HG T1 TCC.  Lamina propria invasion was focal.  He declined repeat TURBT.   He underwent induction BCG x 6 04/2017 and more recently after his maintenance BCG completed 11/2017. Unfortunately, maintenance was unable to be continued in light of national backorder.   More recently, he completed BCG x2 (missed third dose for some reason)  03/2019.  Restaging imaging in the form of CT urogram on 01/14/2019 showed mild diffuse bladder wall thickening but no evidence of GU malignancy.  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 Responded well to topical steroids. Asymptomatic.  He returns today for routine surveillance cystoscopy today.   There were no vitals taken for this visit. NED. A&Ox3.   No respiratory distress   Abd soft, NT, ND Normal phallus with bilateral descended testicles  Cystoscopy Procedure Note  Patient identification was confirmed, informed consent was obtained, and patient was prepped using Betadine solution.  Lidocaine jelly was administered per urethral meatus.     Pre-Procedure: - Inspection reveals a normal caliber ureteral meatus.  Procedure: The flexible cystoscope was introduced without difficulty - No urethral strictures/lesions are present. - {Blank multiple:19197::"Enlarged","Surgically absent","Normal"} prostate *** - {Blank multiple:19197::"Normal","Elevated","Tight"} bladder neck - Bilateral ureteral orifices identified - Bladder mucosa  reveals no ulcers, tumors, or lesions - No bladder stones - No trabeculation  Retroflexion shows ***   Post-Procedure: - Patient tolerated the procedure well  Assessment/ Plan:   No follow-ups on file.  Jamas Lav,  am acting as a scribe for Dr. Hollice Espy,  {Add Scribe Attestation Statement}

## 2019-12-07 ENCOUNTER — Telehealth: Payer: Self-pay | Admitting: Urology

## 2019-12-07 ENCOUNTER — Other Ambulatory Visit: Payer: Medicare HMO | Admitting: Urology

## 2019-12-07 NOTE — Telephone Encounter (Signed)
Can you reach out to Mr. Sachdeva and have him reschedule his cystoscopy for ASAP.  He was a no-show today for his bladder cancer screening cystoscopy.  Hollice Espy, MD

## 2019-12-13 NOTE — Progress Notes (Incomplete)
   12/14/19   CC: No chief complaint on file.   HPI: Matthew Carter is a 79 y.o. M with personal history of bladder cancer status post TURBT 01/2017 with 3.5 cm right anterior bladder wall tumor.  Surgical pathology consistent with HG T1 TCC.  Lamina propria invasion was focal.    He underwent induction BCG x 6 04/2017 and more recently after his maintenance BCG completed 11/2017. Unfortunately, maintenance was unable to be continued in light of national backorder.   More recently, he completed BCG x2 (missed third dose for some reason)  03/2019.  Restaging imaging in the form of CT urogram on 01/14/2019 showed mild diffuse bladder wall thickening but no evidence of GU malignancy.  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 Responded well to topical steroids. Asymptomatic.  He returns today for routine surveillance cystoscopy today.  There were no vitals taken for this visit. NED. A&Ox3.   No respiratory distress   Abd soft, NT, ND Normal phallus with bilateral descended testicles  Cystoscopy Procedure Note  Patient identification was confirmed, informed consent was obtained, and patient was prepped using Betadine solution.  Lidocaine jelly was administered per urethral meatus.     Pre-Procedure: - Inspection reveals a normal caliber ureteral meatus.  Procedure: The flexible cystoscope was introduced without difficulty - No urethral strictures/lesions are present. - {Blank multiple:19197::"Enlarged","Surgically absent","Normal"} prostate *** - {Blank multiple:19197::"Normal","Elevated","Tight"} bladder neck - Bilateral ureteral orifices identified - Bladder mucosa  reveals no ulcers, tumors, or lesions - No bladder stones - No trabeculation  Retroflexion shows ***   Post-Procedure: - Patient tolerated the procedure well  Assessment/ Plan:   No follow-ups on file.  Jamas Lav, am acting as a scribe for  Dr. Hollice Espy,  {Add Scribe Attestation Statement}

## 2019-12-14 ENCOUNTER — Encounter: Payer: Self-pay | Admitting: Urology

## 2019-12-14 ENCOUNTER — Other Ambulatory Visit: Payer: Medicare HMO | Admitting: Urology

## 2019-12-15 ENCOUNTER — Encounter: Payer: Self-pay | Admitting: Urology

## 2020-01-02 ENCOUNTER — Emergency Department: Payer: Medicare HMO

## 2020-01-02 ENCOUNTER — Emergency Department
Admission: EM | Admit: 2020-01-02 | Discharge: 2020-01-02 | Disposition: A | Discharge status: Home / Self Care | Payer: Medicare HMO | Attending: Emergency Medicine | Admitting: Emergency Medicine

## 2020-01-02 ENCOUNTER — Other Ambulatory Visit: Payer: Self-pay

## 2020-01-02 DIAGNOSIS — Y92812 Truck as the place of occurrence of the external cause: Secondary | ICD-10-CM | POA: Insufficient documentation

## 2020-01-02 DIAGNOSIS — S069X0A Unspecified intracranial injury without loss of consciousness, initial encounter: Secondary | ICD-10-CM | POA: Diagnosis not present

## 2020-01-02 DIAGNOSIS — S1191XA Laceration without foreign body of unspecified part of neck, initial encounter: Secondary | ICD-10-CM | POA: Diagnosis not present

## 2020-01-02 DIAGNOSIS — Y93I9 Activity, other involving external motion: Secondary | ICD-10-CM | POA: Insufficient documentation

## 2020-01-02 DIAGNOSIS — R519 Headache, unspecified: Secondary | ICD-10-CM | POA: Diagnosis not present

## 2020-01-02 DIAGNOSIS — R4020 Unspecified coma: Secondary | ICD-10-CM | POA: Diagnosis not present

## 2020-01-02 DIAGNOSIS — T148XXA Other injury of unspecified body region, initial encounter: Secondary | ICD-10-CM

## 2020-01-02 DIAGNOSIS — Z79899 Other long term (current) drug therapy: Secondary | ICD-10-CM | POA: Diagnosis not present

## 2020-01-02 DIAGNOSIS — Z8551 Personal history of malignant neoplasm of bladder: Secondary | ICD-10-CM | POA: Diagnosis not present

## 2020-01-02 DIAGNOSIS — W1789XA Other fall from one level to another, initial encounter: Secondary | ICD-10-CM | POA: Insufficient documentation

## 2020-01-02 DIAGNOSIS — Y999 Unspecified external cause status: Secondary | ICD-10-CM | POA: Diagnosis not present

## 2020-01-02 DIAGNOSIS — S098XXA Other specified injuries of head, initial encounter: Secondary | ICD-10-CM | POA: Diagnosis not present

## 2020-01-02 DIAGNOSIS — S0990XA Unspecified injury of head, initial encounter: Secondary | ICD-10-CM | POA: Diagnosis present

## 2020-01-02 DIAGNOSIS — Z7982 Long term (current) use of aspirin: Secondary | ICD-10-CM | POA: Insufficient documentation

## 2020-01-02 DIAGNOSIS — S0003XA Contusion of scalp, initial encounter: Secondary | ICD-10-CM | POA: Diagnosis not present

## 2020-01-02 DIAGNOSIS — I10 Essential (primary) hypertension: Secondary | ICD-10-CM | POA: Diagnosis not present

## 2020-01-02 DIAGNOSIS — S0083XA Contusion of other part of head, initial encounter: Secondary | ICD-10-CM | POA: Insufficient documentation

## 2020-01-02 DIAGNOSIS — W19XXXA Unspecified fall, initial encounter: Secondary | ICD-10-CM

## 2020-01-02 DIAGNOSIS — Z87891 Personal history of nicotine dependence: Secondary | ICD-10-CM | POA: Diagnosis not present

## 2020-01-02 MED ORDER — ACETAMINOPHEN 500 MG PO TABS
1000.0000 mg | ORAL_TABLET | Freq: Once | ORAL | Status: AC
  Administered 2020-01-02: 1000 mg via ORAL
  Filled 2020-01-02: qty 2
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
## 2020-01-02 NOTE — Discharge Instructions (Addendum)
Please seek medical attention for any high fevers, chest pain, shortness of breath, change in behavior, persistent vomiting, bloody stool or any other new or concerning symptoms.  
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
## 2020-01-03 ENCOUNTER — Telehealth: Payer: Self-pay | Admitting: Internal Medicine

## 2020-01-03 NOTE — Telephone Encounter (Signed)
Patient called today.  He stated he was not sure if he needed to follow up after his hospital visit.  Patient also wanted to make sure you were aware that he was recently in the hospital and could see the notes.   Would you like the patient to follow up with you?

## 2020-01-04 NOTE — Telephone Encounter (Signed)
Please let him know I did see the ER records. I think it would be a good idea to have a follow up---since they were concerned he could have a concussion

## 2020-01-04 NOTE — Telephone Encounter (Signed)
Patient scheduled appointment on 01/05/20 at 4:00.

## 2020-01-05 ENCOUNTER — Encounter: Payer: Self-pay | Admitting: Internal Medicine

## 2020-01-05 ENCOUNTER — Other Ambulatory Visit: Payer: Self-pay

## 2020-01-05 ENCOUNTER — Ambulatory Visit (INDEPENDENT_AMBULATORY_CARE_PROVIDER_SITE_OTHER): Payer: Medicare HMO | Admitting: Internal Medicine

## 2020-01-05 DIAGNOSIS — W19XXXA Unspecified fall, initial encounter: Secondary | ICD-10-CM | POA: Insufficient documentation

## 2020-01-05 DIAGNOSIS — W19XXXD Unspecified fall, subsequent encounter: Secondary | ICD-10-CM | POA: Diagnosis not present

## 2020-01-05 DIAGNOSIS — S139XXA Sprain of joints and ligaments of unspecified parts of neck, initial encounter: Secondary | ICD-10-CM | POA: Insufficient documentation

## 2020-01-05 DIAGNOSIS — S139XXD Sprain of joints and ligaments of unspecified parts of neck, subsequent encounter: Secondary | ICD-10-CM | POA: Diagnosis not present
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
## 2020-01-05 NOTE — Assessment & Plan Note (Signed)
Discussed heat on the area--he is using OTC tiger balm Okay to use tylenol as well
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
## 2020-01-11 ENCOUNTER — Ambulatory Visit: Payer: Medicare HMO | Admitting: Urology

## 2020-01-11 ENCOUNTER — Other Ambulatory Visit: Payer: Self-pay

## 2020-01-11 ENCOUNTER — Other Ambulatory Visit: Payer: Self-pay | Admitting: Urology

## 2020-01-11 VITALS — BP 170/80 | HR 81 | Ht 71.0 in | Wt 230.0 lb

## 2020-01-11 DIAGNOSIS — Z8551 Personal history of malignant neoplasm of bladder: Secondary | ICD-10-CM

## 2020-01-12 LAB — URINALYSIS, COMPLETE
Bilirubin, UA: NEGATIVE
Glucose, UA: NEGATIVE
Ketones, UA: NEGATIVE
Nitrite, UA: NEGATIVE
Protein,UA: NEGATIVE
Specific Gravity, UA: 1.015 (ref 1.005–1.030)
Urobilinogen, Ur: 0.2 mg/dL (ref 0.2–1.0)
pH, UA: 6.5 (ref 5.0–7.5)

## 2020-01-12 LAB — CYTOLOGY - NON PAP

## 2020-01-12 LAB — MICROSCOPIC EXAMINATION

## 2020-01-16 LAB — PATHOLOGY

## 2020-01-25 ENCOUNTER — Other Ambulatory Visit: Payer: Self-pay

## 2020-01-25 ENCOUNTER — Ambulatory Visit (INDEPENDENT_AMBULATORY_CARE_PROVIDER_SITE_OTHER): Payer: Medicare HMO | Admitting: Internal Medicine

## 2020-01-25 ENCOUNTER — Encounter: Payer: Self-pay | Admitting: Internal Medicine

## 2020-01-25 DIAGNOSIS — M545 Low back pain, unspecified: Secondary | ICD-10-CM

## 2020-01-25 NOTE — Progress Notes (Signed)
Subjective:    Patient ID: Matthew Carter, male    DOB: Dec 19, 1940, 79 y.o.   MRN: [id number]  HPI Here due to low back pain from fall This visit occurred during the SARS-CoV-2 public health emergency.  Safety protocols were in place, including screening questions prior to the visit, additional usage of staff PPE, and extensive cleaning of exam room while observing appropriate contact time as indicated for disinfecting solutions.   The neck and shoulders are better Using hot pad on this  Started with some lower back pain several days after my last visit Has used tylenol--helps a bit Mostly pain when doing something or walking Trying to take his time doing thins  No radiation of pain down legs No leg weakness Some better now  Current Outpatient Medications on File Prior to Visit  Medication Sig Dispense Refill  . amLODipine (NORVASC) 10 MG tablet Take 1 tablet (10 mg total) by mouth daily. 7 tablet 0  . aspirin 81 MG tablet Take 81 mg by mouth daily.    Marland Kitchen latanoprost (XALATAN) 0.005 % ophthalmic solution Place 1 drop into both eyes at bedtime.     Marland Kitchen loratadine (CLARITIN) 10 MG tablet Take 10 mg by mouth daily as needed for allergies.    Marland Kitchen timolol (TIMOPTIC) 0.5 % ophthalmic solution Place 1 drop into both eyes daily.      No current facility-administered medications on file prior to visit.    No Known Allergies  Past Medical History:  Diagnosis Date  . Adenomatous polyps   . Allergy   . Cancer ([place]) 2017   bladder  . ED (erectile dysfunction)   . Glaucoma   . Glucose intolerance (impaired glucose tolerance)   . Hemorrhoids   . Hypertension     Past Surgical History:  Procedure Laterality Date  . CYSTOSCOPY N/A 04/28/2018   Procedure: Flexible CYSTOSCOPY;  Surgeon: Hollice Espy, MD;  Location: [facility];  Service: Urology;  Laterality: N/A;  will be using flexible cystoscope  . PENILE BIOPSY N/A 04/28/2018   Procedure: PENILE BIOPSY;  Surgeon: Hollice Espy, MD;   Location: [facility];  Service: Urology;  Laterality: N/A;  . TRANSURETHRAL RESECTION OF BLADDER TUMOR N/A 01/09/2017   Procedure: TRANSURETHRAL RESECTION OF BLADDER TUMOR (TURBT)-(MEDIUM);  Surgeon: Nickie Retort, MD;  Location: [facility];  Service: Urology;  Laterality: N/A;    Family History  Problem Relation Age of Onset  . GER disease Mother   . Hypertension Mother   . Kidney failure Mother   . Stroke Father   . Hypertension Father   . Cancer Paternal Grandfather        Leukemia  . Diabetes Paternal Grandfather   . Cancer Brother        throat cancer  . Colon cancer Neg Hx   . Prostate cancer Neg Hx   . Bladder Cancer Neg Hx     Social History   Socioeconomic History  . Marital status: Married    Spouse name: Not on file  . Number of children: 3  . Years of education: Not on file  . Highest education level: Not on file  Occupational History  . 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 Occupation: Surveyor, quantity    Comment: part time  . Occupation: Estate manager/land agent part time    Comment: [place]  Tobacco Use  . Smoking status: Former Smoker    Quit date: 08/04/1981    Years since quitting: 38.5  .  Smokeless tobacco: Never Used  Vaping Use  . Vaping Use: Never used  Substance and Sexual Activity  . Alcohol use: No    Alcohol/week: 0.0 standard drinks  . Drug use: No  . Sexual activity: Not on file  Other Topics Concern  . Not on file  Social History Narrative   Has living will    No formal health care POA but requests wife--alternate is son   Would accept resuscitation attempts but no prolonged artificial ventilation   Would accept a feeding tube   Social Determinants of Health   Financial Resource Strain:   . Difficulty of Paying Living Expenses:   Food Insecurity:   . Worried About Charity fundraiser in the Last Year:   . Arboriculturist in the Last Year:   Transportation Needs:   . Film/video editor (Medical):    Marland Kitchen Lack of Transportation (Non-Medical):   Physical Activity:   . Days of Exercise per Week:   . Minutes of Exercise per Session:   Stress:   . Feeling of Stress :   Social Connections:   . Frequency of Communication with Friends and Family:   . Frequency of Social Gatherings with Friends and Family:   . Attends Religious Services:   . Active Member of Clubs or Organizations:   . Attends Archivist Meetings:   Marland Kitchen Marital Status:   Intimate Partner Violence:   . Fear of Current or Ex-Partner:   . Emotionally Abused:   Marland Kitchen Physically Abused:   . Sexually Abused:    Review of Systems  No loss of bladder or bowel function Recent cystoscopy---1 small spot so getting a repeat in August     Objective:   Physical Exam  Musculoskeletal:     Comments: Mild tenderness in left lumbar area No spine tenderness Flexion in back is fairly normal Normal passive ROM in hips SLR negative  Neurological:  Normal gait No leg weakness           Assessment & Plan:

## 2020-01-25 NOTE — Assessment & Plan Note (Signed)
Does seem to be related to fall Nothing to suggest disc etiology or radiculopathy Reassured----muscular Continue heat/tylenol prn

## 2020-01-30 DIAGNOSIS — H401112 Primary open-angle glaucoma, right eye, moderate stage: Secondary | ICD-10-CM | POA: Diagnosis not present

## 2020-03-06 ENCOUNTER — Telehealth: Payer: Self-pay

## 2020-03-06 NOTE — Telephone Encounter (Signed)
I spoke with pt; pt did not go to UC last night; pt said today pt has bandage on area and has not taken bandage off this AM. Pt said area is not hurting and pt does not have fever; pt already has appt 03/09/20 at 8:45. Pt wants to keep that appt unless condition changes or worsens and pt will either call [facility] or go to UC. FYI to Dr Silvio Pate.

## 2020-03-06 NOTE — Telephone Encounter (Signed)
He may want to try warm compresses to make sure all the stuff in there drains out I can add him on at the end of tomorrow morning if he wants---but if the cyst drains and it is not hurting, that may not be needed

## 2020-03-06 NOTE — Telephone Encounter (Signed)
[place] Night - Client TELEPHONE ADVICE RECORD [facility] Patient Name: Matthew Carter Gender: Male DOB: 07-Jun-1941 Age: 79 Y 56 M 9 D Return Phone Number: [phone] (Secondary) Address: [place]/State/Zip: [place] [address] Client [place] Night - Client Client Site [place] Physician Viviana Simpler- MD Contact Type Call Who Is Calling Patient / Member / Family / Caregiver Call Type Triage / Clinical Relationship To Patient Self Return Phone Number [phone] (Secondary) Chief Complaint Wound Check or Dressing Change Reason for Call Symptomatic / Request for [place] states that you cyst that busted open. He wants to know is he should be seen tonight. CBWN no change Translation No Nurse Assessment Nurse: Susy Manor, RN, Megan Date/Time (Eastern Time): 03/05/2020 8:43:12 PM Confirm and document reason for call. If symptomatic, describe symptoms. ---Caller states he has a cyst under his arm pit that busted open tonight. Caller denies pain, and fever. Has the patient had close contact with a person known or suspected to have the novel coronavirus illness OR traveled / lives in area with major community spread (including international travel) in the last 14 days from the onset of symptoms? * If Asymptomatic, screen for exposure and travel within the last 14 days. ---No Does the patient have any new or worsening symptoms? ---Yes Will a triage be completed? ---Yes Related visit to physician within the last 2 weeks? ---No Does the PT have any chronic conditions? (i.e. diabetes, asthma, this includes High risk factors for pregnancy, etc.) ---Yes List chronic conditions. ---HTN Is this a behavioral health or substance abuse call? ---No Guidelines Guideline Title Affirmed Question Affirmed Notes Nurse Date/Time (Eastern Time) Skin Lump or  Localized Swelling [1] Swelling is painful to touch AND [2] no fever Susy Manor, RN, Megan 03/05/2020 8:44:36 PM Disp. Time Eilene Ghazi Time) Disposition Final User 03/05/2020 8:40:10 PM Send To Call Back Waiting For Nurse Maurice March 03/05/2020 8:41:51 PM Send To Clinical Follow Up Norton Blizzard, RN, Maretta Bees NOTE: All timestamps contained within this report are represented as [place] Standard Time. CONFIDENTIALTY NOTICE: This fax transmission is intended only for the addressee. It contains information that is legally privileged, confidential or otherwise protected from use or disclosure. If you are not the intended recipient, you are strictly prohibited from reviewing, disclosing, copying using or disseminating any of this information or taking any action in reliance on or regarding this information. If you have received this fax in error, please notify us immediately by telephone so that we can arrange for its return to [place]. Phone: [phone], Toll-Free: [phone], Fax: [phone] Page: 2 of 2 Call Id: [id number] [place]. Time Eilene Ghazi Time) Disposition Final User 03/05/2020 8:41:54 PM Send To Clinical Follow Up Norton Blizzard, RN, Daniel 03/05/2020 8:41:57 PM Send To Clinical Follow Up Norton Blizzard, RN, Daniel 03/05/2020 8:42:00 PM Send To Clinical Follow Up Norton Blizzard, RN, Daniel 03/05/2020 8:42:03 PM Send To Clinical Follow Up Norton Blizzard, RN, Daniel 03/05/2020 8:42:08 PM Send To Clinical Follow Up Norton Blizzard, RN, Daniel 03/05/2020 8:42:10 PM Send To Clinical Follow Up Norton Blizzard, RN, Daniel 03/05/2020 8:42:14 PM Send To Clinical Follow Up Norton Blizzard, RN, Daniel 03/05/2020 8:42:17 PM Send To Clinical Follow Up Norton Blizzard, RN, Quillian Quince 03/05/2020 8:45:58 PM See PCP within 24 Hours Yes Susy Manor, RN, Delphia Grates Disagree/Comply Comply Caller Understands Yes PreDisposition [place] Advice Given Per Guideline SEE PCP WITHIN 24 HOURS: CALL BACK IF: * Severe  pain occurs * Fever occurs * You become  worse. CARE ADVICE given per Skin Swelling or Lump (Adult) guideline. Referrals REFERRED TO PCP OFFICE

## 2020-03-06 NOTE — Telephone Encounter (Signed)
Spoke to pt. He said he is not having a major issue just draining. He will keep it covered and will let us know he has any issues before the 6th.

## 2020-03-09 ENCOUNTER — Encounter: Payer: Self-pay | Admitting: Internal Medicine

## 2020-03-09 ENCOUNTER — Ambulatory Visit (INDEPENDENT_AMBULATORY_CARE_PROVIDER_SITE_OTHER): Payer: Medicare HMO | Admitting: Internal Medicine

## 2020-03-09 ENCOUNTER — Other Ambulatory Visit: Payer: Self-pay

## 2020-03-09 DIAGNOSIS — L089 Local infection of the skin and subcutaneous tissue, unspecified: Secondary | ICD-10-CM | POA: Insufficient documentation

## 2020-03-09 DIAGNOSIS — L723 Sebaceous cyst: Secondary | ICD-10-CM | POA: Diagnosis not present

## 2020-03-09 MED ORDER — CEPHALEXIN 500 MG PO CAPS: 500.0000 mg | ORAL_CAPSULE | Freq: Three times a day (TID) | ORAL | 1 refills | Status: DC

## 2020-03-09 NOTE — Assessment & Plan Note (Addendum)
Recurred from 6-7 years ago Clearly infected but has drained---and just has induration now Discussed still trying warm compresses Try cephalexin----switch to doxy if doesn't respond

## 2020-03-09 NOTE — Progress Notes (Signed)
Subjective:    Patient ID: Matthew Carter, male    DOB: April 22, 1941, 79 y.o.   MRN: [id number]  HPI Here due to apparent cyst This visit occurred during the SARS-CoV-2 public health emergency.  Safety protocols were in place, including screening questions prior to the visit, additional usage of staff PPE, and extensive cleaning of exam room while observing appropriate contact time as indicated for disinfecting solutions.   Had problems with cyst in left axilla 6-7 years ago Did drain without incision here--and then on antibiotic  Started acting up again earlier this week Then smelled something and noticed it was draining No pain  Current Outpatient Medications on File Prior to Visit  Medication Sig Dispense Refill  . amLODipine (NORVASC) 10 MG tablet Take 1 tablet (10 mg total) by mouth daily. 7 tablet 0  . aspirin 81 MG tablet Take 81 mg by mouth daily.    Marland Kitchen latanoprost (XALATAN) 0.005 % ophthalmic solution Place 1 drop into both eyes at bedtime.     Marland Kitchen loratadine (CLARITIN) 10 MG tablet Take 10 mg by mouth daily as needed for allergies.    Marland Kitchen timolol (TIMOPTIC) 0.5 % ophthalmic solution Place 1 drop into both eyes daily.      No current facility-administered medications on file prior to visit.    No Known Allergies  Past Medical History:  Diagnosis Date  . Adenomatous polyps   . Allergy   . Cancer ([place]) 2017   bladder  . ED (erectile dysfunction)   . Glaucoma   . Glucose intolerance (impaired glucose tolerance)   . Hemorrhoids   . Hypertension     Past Surgical History:  Procedure Laterality Date  . CYSTOSCOPY N/A 04/28/2018   Procedure: Flexible CYSTOSCOPY;  Surgeon: Hollice Espy, MD;  Location: [facility];  Service: Urology;  Laterality: N/A;  will be using flexible cystoscope  . PENILE BIOPSY N/A 04/28/2018   Procedure: PENILE BIOPSY;  Surgeon: Hollice Espy, MD;  Location: [facility];  Service: Urology;  Laterality: N/A;  . TRANSURETHRAL RESECTION OF BLADDER TUMOR  N/A 01/09/2017   Procedure: TRANSURETHRAL RESECTION OF BLADDER TUMOR (TURBT)-(MEDIUM);  Surgeon: Nickie Retort, MD;  Location: [facility];  Service: Urology;  Laterality: N/A;    Family History  Problem Relation Age of Onset  . GER disease Mother   . Hypertension Mother   . Kidney failure Mother   . Stroke Father   . Hypertension Father   . Cancer Paternal Grandfather        Leukemia  . Diabetes Paternal Grandfather   . Cancer Brother        throat cancer  . Colon cancer Neg Hx   . Prostate cancer Neg Hx   . Bladder Cancer Neg Hx     Social History   Socioeconomic History  . Marital status: Married    Spouse name: Not on file  . Number of children: 3  . Years of education: Not on file  . Highest education level: Not on file  Occupational History  . 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 Occupation: Surveyor, quantity    Comment: part time  . Occupation: Estate manager/land agent part time    Comment: [place]  Tobacco Use  . Smoking status: Former Smoker    Quit date: 08/04/1981    Years since quitting: 38.6  . Smokeless tobacco: Never Used  Vaping Use  . Vaping Use: Never used  Substance and Sexual Activity  . Alcohol use:  No    Alcohol/week: 0.0 standard drinks  . Drug use: No  . Sexual activity: Not on file  Other Topics Concern  . Not on file  Social History Narrative   Has living will    No formal health care POA but requests wife--alternate is son   Would accept resuscitation attempts but no prolonged artificial ventilation   Would accept a feeding tube   Social Determinants of Health   Financial Resource Strain:   . Difficulty of Paying Living Expenses:   Food Insecurity:   . Worried About Charity fundraiser in the Last Year:   . Arboriculturist in the Last Year:   Transportation Needs:   . Film/video editor (Medical):   Marland Kitchen Lack of Transportation (Non-Medical):   Physical Activity:   . Days of Exercise per Week:   .  Minutes of Exercise per Session:   Stress:   . Feeling of Stress :   Social Connections:   . Frequency of Communication with Friends and Family:   . Frequency of Social Gatherings with Friends and Family:   . Attends Religious Services:   . Active Member of Clubs or Organizations:   . Attends Archivist Meetings:   Marland Kitchen Marital Status:   Intimate Partner Violence:   . Fear of Current or Ex-Partner:   . Emotionally Abused:   Marland Kitchen Physically Abused:   . Sexually Abused:    Review of Systems No fever Feels fine    Objective:   Physical Exam Musculoskeletal:     Comments: Indurated cyst in left axilla ~2.5cm diameter and 2 cm deep Expressed a few ml of pus Mild inflammation            Assessment & Plan:

## 2020-03-12 NOTE — Progress Notes (Signed)
° °  03/13/2020  CC:  Chief Complaint  Patient presents with   Cysto    HPI: Matthew Carter is a 79 y.o. male with personal history of bladder cancer returns for a cystoscopy and close follow up of bladder lesion.   Patient is s/p TURBT 01/2017 with 3.5 cm right anterior bladder wall tumor. Surgical pathology consistent with HG T1 TCC. Lamina propria invasion was focal. He declined repeat TURBT.   He underwent induction BCG x 6 04/2017 and more recently after his maintenance BCG completed 11/2017. Unfortunately, maintenance was unable to be continued in light of national backorder.More recently, he completed BCG x2 (missed third dose for some reason) 03/2019.  Restaging imaging in the form of CT urogram on 01/14/2019 showed mild diffuse bladder wall thickening but no evidence of GU malignancy.  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 Responded well to topical steroids. Asymptomatic.  Last cysto 01/11/20 showed a small scar in the central area with an inflamed appearance. Urine cytology showed negative for high-grade urothelial carcinoma.    Blood pressure (!) 179/89, pulse 89. NED. A&Ox3.   No respiratory distress   Abd soft, NT, ND Normal phallus with bilateral descended testicles  Cystoscopy Procedure Note  Patient identification was confirmed, informed consent was obtained, and patient was prepped using Betadine solution.  Lidocaine jelly was administered per urethral meatus.     Pre-Procedure: - Inspection reveals a normal caliber ureteral meatus.  Procedure: The flexible cystoscope was introduced without difficulty - No urethral strictures/lesions are present. - Enlarged prostate with trilobar coaptation - Normal bladder neck - Stellate scar on posterior bladder wall but in the central area of this particular scar, there is a distinct area of erythema but without papillary change, <5 mm area which appears  stable to possibly less conspicuous than on previous cystoscopy - Bladder mildly trabeculated, no lesions or tumors - Bilateral ureteral orifices identified - No bladder stones - Mild trabeculation  Retroflexion unremarkable   Post-Procedure: - Patient tolerated the procedure well  Assessment/ Plan:  1. Bladder erythema Stable if not improved from previous cysto on 01/11/20 Suspected inflammation vs stellate scar Negative cytology x 1, will repeat cytology today We discussed the differential diagnosis which is likely inflammatory but also cannot rule out underlying malignancy.  Offered bladder biopsy which he declined.  Like to pursue conservative management.  We will have him come back in 6 months for repeat cystoscopy as long as his urine cytology is normal I follow this lesion closely.  He is agreeable this plan.  2. History of bladder cancer/bladder erythema  Continue every 6 month surveillance  RTC in 6 months for cysto and sooner if he experiences symptoms   I, Selena Batten, am acting as a scribe for Dr. Hollice Espy.  I have reviewed the above documentation for accuracy and completeness, and I agree with the above.   Hollice Espy, MD

## 2020-03-13 ENCOUNTER — Ambulatory Visit: Payer: Medicare HMO | Admitting: Urology

## 2020-03-13 ENCOUNTER — Encounter: Payer: Self-pay | Admitting: Urology

## 2020-03-13 ENCOUNTER — Other Ambulatory Visit: Payer: Self-pay | Admitting: Urology

## 2020-03-13 ENCOUNTER — Other Ambulatory Visit: Payer: Self-pay

## 2020-03-13 VITALS — BP 179/89 | HR 89

## 2020-03-13 DIAGNOSIS — Z8551 Personal history of malignant neoplasm of bladder: Secondary | ICD-10-CM | POA: Diagnosis not present

## 2020-03-13 LAB — MICROSCOPIC EXAMINATION: Bacteria, UA: NONE SEEN

## 2020-03-13 LAB — URINALYSIS, COMPLETE
Bilirubin, UA: NEGATIVE
Glucose, UA: NEGATIVE
Ketones, UA: NEGATIVE
Nitrite, UA: NEGATIVE
Protein,UA: NEGATIVE
RBC, UA: NEGATIVE
Specific Gravity, UA: 1.01 (ref 1.005–1.030)
Urobilinogen, Ur: 0.2 mg/dL (ref 0.2–1.0)
pH, UA: 6 (ref 5.0–7.5)

## 2020-03-14 LAB — CYTOLOGY - NON PAP

## 2020-03-15 ENCOUNTER — Ambulatory Visit (INDEPENDENT_AMBULATORY_CARE_PROVIDER_SITE_OTHER): Payer: Medicare HMO | Admitting: Internal Medicine

## 2020-03-15 ENCOUNTER — Encounter: Payer: Self-pay | Admitting: Internal Medicine

## 2020-03-15 ENCOUNTER — Other Ambulatory Visit: Payer: Self-pay

## 2020-03-15 VITALS — BP 124/76 | HR 76 | Temp 97.6°F | Ht 72.0 in | Wt 229.0 lb

## 2020-03-15 DIAGNOSIS — I872 Venous insufficiency (chronic) (peripheral): Secondary | ICD-10-CM | POA: Diagnosis not present

## 2020-03-15 DIAGNOSIS — Z7189 Other specified counseling: Secondary | ICD-10-CM

## 2020-03-15 DIAGNOSIS — H4010X0 Unspecified open-angle glaucoma, stage unspecified: Secondary | ICD-10-CM

## 2020-03-15 DIAGNOSIS — Z8551 Personal history of malignant neoplasm of bladder: Secondary | ICD-10-CM | POA: Diagnosis not present

## 2020-03-15 DIAGNOSIS — I10 Essential (primary) hypertension: Secondary | ICD-10-CM

## 2020-03-15 DIAGNOSIS — J301 Allergic rhinitis due to pollen: Secondary | ICD-10-CM

## 2020-03-15 DIAGNOSIS — Z0000 Encounter for general adult medical examination without abnormal findings: Secondary | ICD-10-CM

## 2020-03-15 LAB — PATHOLOGY

## 2020-03-15 NOTE — Assessment & Plan Note (Signed)
Continues with regular surveillance cystos

## 2020-03-15 NOTE — Assessment & Plan Note (Signed)
BP Readings from Last 3 Encounters:  03/15/20 124/76  03/13/20 (!) 179/89  03/09/20 126/68   Good control Will check labs  On amlodipine

## 2020-03-15 NOTE — Assessment & Plan Note (Signed)
Mild Does okay with compression socks Might consider decreasing amlodipine if worsens

## 2020-03-15 NOTE — Assessment & Plan Note (Signed)
Does well with loratadine

## 2020-03-15 NOTE — Progress Notes (Signed)
Subjective:    Patient ID: Matthew Carter, male    DOB: Jun 06, 1941, 79 y.o.   MRN: [id number]  HPI Here for Medicare wellness visit and follow up of chronic health conditions This visit occurred during the SARS-CoV-2 public health emergency.  Safety protocols were in place, including screening questions prior to the visit, additional usage of staff PPE, and extensive cleaning of exam room while observing appropriate contact time as indicated for disinfecting solutions.   Reviewed form and advanced directives Reviewed other doctors No alcohol or tobacco Tries to walk 2 days a week or so Has hearing aides---wears them just sporadically Had 1 fall--with injury No depression or anhedonia Independent with instrumental ADLs No sig memory issues  The left axillary cyst is better Minimal drainage No pain now  Just had repeat cystoscopy this week Nothing worrisome  Continues on amlodipine No chest pain or SOB No dizziness or syncope No headaches No edema No palpitations  Continues on glaucoma medication Vision is okay  Uses the loratadine prn for allergies Mostly spring and some in fall  Current Outpatient Medications on File Prior to Visit  Medication Sig Dispense Refill  . amLODipine (NORVASC) 10 MG tablet Take 1 tablet (10 mg total) by mouth daily. 7 tablet 0  . aspirin 81 MG tablet Take 81 mg by mouth daily.    . cephALEXin (KEFLEX) 500 MG capsule Take 1 capsule (500 mg total) by mouth 3 (three) times daily. 21 capsule 1  . latanoprost (XALATAN) 0.005 % ophthalmic solution Place 1 drop into both eyes at bedtime.     Marland Kitchen loratadine (CLARITIN) 10 MG tablet Take 10 mg by mouth daily as needed for allergies.    Marland Kitchen timolol (TIMOPTIC) 0.5 % ophthalmic solution Place 1 drop into both eyes daily.      No current facility-administered medications on file prior to visit.    No Known Allergies  Past Medical History:  Diagnosis Date  . Adenomatous polyps   . Allergy   . Cancer  ([place]) 2017   bladder  . ED (erectile dysfunction)   . Glaucoma   . Glucose intolerance (impaired glucose tolerance)   . Hemorrhoids   . Hypertension     Past Surgical History:  Procedure Laterality Date  . CYSTOSCOPY N/A 04/28/2018   Procedure: Flexible CYSTOSCOPY;  Surgeon: Hollice Espy, MD;  Location: [facility] OR[facility];  Service: Urology;  Laterality: N/A;  will be using flexible cystoscope  . PENILE BIOPSY N/A 04/28/2018   Procedure: PENILE BIOPSY;  Surgeon: Hollice Espy, MD;  Location: [facility];  Service: Urology;  Laterality: N/A;  . TRANSURETHRAL RESECTION OF BLADDER TUMOR N/A 01/09/2017   Procedure: TRANSURETHRAL RESECTION OF BLADDER TUMOR (TURBT)-(MEDIUM);  Surgeon: Nickie Retort, MD;  Location: [facility];  Service: Urology;  Laterality: N/A;    Family History  Problem Relation Age of Onset  . GER disease Mother   . Hypertension Mother   . Kidney failure Mother   . Stroke Father   . Hypertension Father   . Cancer Paternal Grandfather        Leukemia  . Diabetes Paternal Grandfather   . Cancer Brother        throat cancer  . Colon cancer Neg Hx   . Prostate cancer Neg Hx   . Bladder Cancer Neg Hx     Social History   Socioeconomic History  . Marital status: Married    Spouse name: Not on file  . Number of children: 3  .  Years of education: Not on file  . Highest education level: Not on file  Occupational History  . Occupation: Retired Information systems manager: [employer]  . Occupation: Surveyor, quantity    Comment: part time  . Occupation: Estate manager/land agent part time    Comment: [place]  Tobacco Use  . Smoking status: Former Smoker    Quit date: 08/04/1981    Years since quitting: 38.6  . Smokeless tobacco: Never Used  Vaping Use  . Vaping Use: Never used  Substance and Sexual Activity  . Alcohol use: No    Alcohol/week: 0.0 standard drinks  . Drug use: No  . Sexual activity: Not on file  Other Topics Concern  . Not on file  Social History  Narrative   Has living will    No formal health care POA but requests wife--alternate is son   Would accept resuscitation attempts but no prolonged artificial ventilation   Would accept a feeding tube   Social Determinants of Health   Financial Resource Strain:   . Difficulty of Paying Living Expenses:   Food Insecurity:   . Worried About Charity fundraiser in the Last Year:   . Arboriculturist in the Last Year:   Transportation Needs:   . Film/video editor (Medical):   Marland Kitchen Lack of Transportation (Non-Medical):   Physical Activity:   . Days of Exercise per Week:   . Minutes of Exercise per Session:   Stress:   . Feeling of Stress :   Social Connections:   . Frequency of Communication with Friends and Family:   . Frequency of Social Gatherings with Friends and Family:   . Attends Religious Services:   . Active Member of Clubs or Organizations:   . Attends Archivist Meetings:   Marland Kitchen Marital Status:   Intimate Partner Violence:   . Fear of Current or Ex-Partner:   . Emotionally Abused:   Marland Kitchen Physically Abused:   . Sexually Abused:    Review of Systems Appetite is fine Weight is stable Sleeps well Wears seat belt Full dentures---sporadic wearing them No rash or suspicious skin lesions No heartburn or dysphagia Bowels are fine. No blood No sig back or joint pains---occasional knee aching    Objective:   Physical Exam Constitutional:      Appearance: Normal appearance.  HENT:     Mouth/Throat:     Comments: No lesions Eyes:     Conjunctiva/sclera: Conjunctivae normal.     Pupils: Pupils are equal, round, and reactive to light.  Cardiovascular:     Rate and Rhythm: Normal rate and regular rhythm.     Pulses: Normal pulses.     Heart sounds: No murmur heard.  No gallop.   Pulmonary:     Effort: Pulmonary effort is normal.     Breath sounds: Normal breath sounds. No wheezing or rales.  Abdominal:     Tenderness: There is no abdominal tenderness.   Musculoskeletal:     Cervical back: Neck supple.     Comments: Trace to 1+ pitting edema in his ankles  Lymphadenopathy:     Cervical: No cervical adenopathy.  Skin:    Comments: Cyst in left axilla still enlarged---able to express some cyst contents and pus still----asked him to resume hot packing  Neurological:     Mental Status: He is alert and oriented to person, place, and time.     Comments: President--- "Edmon Crape, [place], Bush----Obama" [phone] [id number] Recall 3/3  Psychiatric:        Mood and Affect: Mood normal.        Behavior: Behavior normal.            Assessment & Plan:

## 2020-03-15 NOTE — Assessment & Plan Note (Signed)
Done okay on the timolol drops

## 2020-03-15 NOTE — Assessment & Plan Note (Signed)
I have personally reviewed the Medicare Annual Wellness questionnaire and have noted 1. The patient's medical and social history 2. Their use of alcohol, tobacco or illicit drugs 3. Their current medications and supplements 4. The patient's functional ability including ADL's, fall risks, home safety risks and hearing or visual             impairment. 5. Diet and physical activities 6. Evidence for depression or mood disorders  The patients weight, height, BMI and visual acuity have been recorded in the chart I have made referrals, counseling and provided education to the patient based review of the above and I have provided the pt with a written personalized care plan for preventive services.  I have provided you with a copy of your personalized plan for preventive services. Please take the time to review along with your updated medication list.  Flu vaccine in the fall No PSA due to age Missed colon in 2019---will ask Dr Carlean Purl about doing one last one Discussed exercise

## 2020-03-15 NOTE — Assessment & Plan Note (Signed)
See social history 

## 2020-03-16 LAB — CBC
HCT: 36 % — ABNORMAL LOW (ref 39.0–52.0)
Hemoglobin: 11.8 g/dL — ABNORMAL LOW (ref 13.0–17.0)
MCHC: 32.9 g/dL (ref 30.0–36.0)
MCV: 85.7 fl (ref 78.0–100.0)
Platelets: 288 10*3/uL (ref 150.0–400.0)
RBC: 4.2 Mil/uL — ABNORMAL LOW (ref 4.22–5.81)
RDW: 14.9 % (ref 11.5–15.5)
WBC: 5.1 10*3/uL (ref 4.0–10.5)

## 2020-03-16 LAB — COMPREHENSIVE METABOLIC PANEL
ALT: 9 U/L (ref 0–53)
AST: 12 U/L (ref 0–37)
Albumin: 3.7 g/dL (ref 3.5–5.2)
Alkaline Phosphatase: 96 U/L (ref 39–117)
BUN: 11 mg/dL (ref 6–23)
CO2: 32 mEq/L (ref 19–32)
Calcium: 9.5 mg/dL (ref 8.4–10.5)
Chloride: 103 mEq/L (ref 96–112)
Creatinine, Ser: 1.11 mg/dL (ref 0.40–1.50)
GFR: 77.61 mL/min (ref >60.00)
Glucose, Bld: 98 mg/dL (ref 70–99)
Potassium: 4 mEq/L (ref 3.5–5.1)
Sodium: 140 mEq/L (ref 135–145)
Total Bilirubin: 0.3 mg/dL (ref 0.2–1.2)
Total Protein: 6.8 g/dL (ref 6.0–8.3)

## 2020-03-30 ENCOUNTER — Telehealth: Payer: Self-pay

## 2020-03-30 NOTE — Progress Notes (Signed)
Sheri,  Please contact the patient and explain that I do recommend one more colonoscopy.  Schedule that if he agrees.  If he has questions/unsure let me know and I will call him next week when I return.  CEG

## 2020-03-30 NOTE — Telephone Encounter (Signed)
Patient declines to schedule until he talks with Dr. Carlean Purl.  Please call him at your convenience.

## 2020-03-30 NOTE — Telephone Encounter (Addendum)
Gatha Mayer, MD sent to Venia Carbon, MD; Marlon Pel, RN     Previous Messages  Routed Note  Author: Gatha Mayer, MD Service: Gastroenterology Author Type: Physician  Filed: 03/30/2020 9:19 AM Encounter Date: 03/15/2020 Status: Signed  Editor: Gatha Mayer, MD (Physician)     Show:Clear all [x] Manual[] Template[] Copied  Added by: [x] Gessner, Carl E, MD  [] Hover for details Ammie Dalton,  Please contact the patient and explain that I do recommend one more colonoscopy.  Schedule that if he agrees.  If he has questions/unsure let me know and I will call him next week when I return.  CEG         ----- Message from Barbera Setters, MD sent at 03/30/2020  9:18 AM EDT -----   ----- Message ----- From: 04/12/2020, MD Sent: 03/15/2020   3:07 PM EDT To: 21/07/2020, MD  Carl,Does he need one last colonoscopy? He is willing (and may have skipped it due to treatment then for bladder cancer). Can you contact him again if he needs it?Thanks, Gatha Mayer

## 2020-04-05 ENCOUNTER — Telehealth: Payer: Self-pay | Admitting: Internal Medicine

## 2020-04-05 ENCOUNTER — Other Ambulatory Visit: Payer: Self-pay | Admitting: Internal Medicine

## 2020-04-05 NOTE — Telephone Encounter (Signed)
Pt called he has a question about colonoscopy.  He stated his father 45 when he had a colonoscopy and that night he had a stroke. Pt is wanting to know if a colonoscopy is necessary for him

## 2020-04-05 NOTE — Telephone Encounter (Signed)
Please let him know that I do think he should get the colonoscopy. A stroke as a complication of a colonoscopy is not an expected side effect--and the anaesthetic they use now is much safer than in the past

## 2020-04-05 NOTE — Telephone Encounter (Signed)
I left a message on voicemail that I would try to call him back soon

## 2020-04-10 DIAGNOSIS — H401112 Primary open-angle glaucoma, right eye, moderate stage: Secondary | ICD-10-CM | POA: Diagnosis not present

## 2020-04-14 ENCOUNTER — Ambulatory Visit (INDEPENDENT_AMBULATORY_CARE_PROVIDER_SITE_OTHER): Payer: Medicare HMO

## 2020-04-14 ENCOUNTER — Other Ambulatory Visit: Payer: Self-pay

## 2020-04-14 DIAGNOSIS — Z23 Encounter for immunization: Secondary | ICD-10-CM | POA: Diagnosis not present

## 2020-04-14 NOTE — Telephone Encounter (Signed)
Spoke to pt

## 2020-05-10 NOTE — Telephone Encounter (Signed)
Can we get his colonoscopy scheduled?

## 2020-05-10 NOTE — Telephone Encounter (Signed)
Spoke with patient regarding scheduling. Pt has been scheduled for a pre-visit on 06/14/20 at 8:30 am. Pt is scheduled for a colon on 06/19/20 at 9 am with an 8 am arrival time. Pt is aware of appt information, I have also sent him a message with this information via My Chart.

## 2020-05-10 NOTE — Telephone Encounter (Signed)
I called him and spoke to him and explained the rationale for repeating a colonoscopy.  He agrees to schedule.  I also explained that I was absent due to an injury.  I should be scoping again in November and December so we will call him and arrange for a nurse visit and colonoscopy for history of colon polyps.

## 2020-05-10 NOTE — Telephone Encounter (Signed)
Pt called in and said he is following up on getting his colonoscopy. He said he was waiting on a response from Dr. Silvio Pate regarding this.

## 2020-06-19 ENCOUNTER — Encounter: Payer: Medicare HMO | Admitting: Internal Medicine

## 2020-06-25 ENCOUNTER — Ambulatory Visit (AMBULATORY_SURGERY_CENTER): Payer: Self-pay | Admitting: *Deleted

## 2020-06-25 ENCOUNTER — Other Ambulatory Visit: Payer: Self-pay

## 2020-06-25 VITALS — Ht 71.0 in | Wt 228.2 lb

## 2020-06-25 DIAGNOSIS — Z86010 Personal history of colonic polyps: Secondary | ICD-10-CM

## 2020-06-25 NOTE — Progress Notes (Signed)
Completed covid vaccines and booster  Pt is aware that care partner will wait in the car during procedure; if they feel like they will be too hot or cold to wait in the car; they may wait in the 4 th floor lobby. Patient is aware to bring only one care partner. We want them to wear a mask (we do not have any that we can provide them), practice social distancing, and we will check their temperatures when they get here.  I did remind the patient that their care partner needs to stay in the parking lot the entire time and have a cell phone available, we will call them when the pt is ready for discharge. Patient will wear mask into building.   No trouble with anesthesia, difficulty with intubation or hx/fam hx of malignant hyperthermia per pt    No egg or soy allergy  No home oxygen use   No medications for weight loss taken  Pt denies constipation issues

## 2020-07-03 NOTE — Progress Notes (Signed)
[name], MD, [place]  [facility] [place] [facility] [place], [address]  Phone: [phone]  FAX: [phone]  WINN MUEHL - 79 y.o. male  MRN [id number]  Date of Birth: 01/25/1941  Date: 07/04/2020  PCP: Venia Carbon, MD  Referral: Venia Carbon, MD  Chief Complaint  Patient presents with  . Fall    Last Wednesday  . Hand Pain    Right    This visit occurred during the SARS-CoV-2 public health emergency.  Safety protocols were in place, including screening questions prior to the visit, additional usage of staff PPE, and extensive cleaning of exam room while observing appropriate contact time as indicated for disinfecting solutions.   Subjective:   Matthew Carter is a 79 y.o. very pleasant male patient with Body mass index is 30.99 kg/m. who presents with the following:  Initial evaluation after a fall with some persistent right-sided hand pain and arm pain.  Fell and on outstretched hand -this occurred approximately 1 week ago.  Cooler was in hands, slipped.  He continues to have pain in the metacarpal region.  Does have swelling throughout the entirety of the dorsum of his hand.  He denies any pain in the distal radius and ulna.  He denies finger pain whatsoever.  The contralateral side is entirely normal, and he has no pain throughout.  Review of Systems is noted in the HPI, as appropriate   Objective:   BP 114/60   Pulse 70   Temp (!) 97.4 F (36.3 C) (Temporal)   Ht 6' (1.829 m)   Wt 228 lb 8 oz (103.6 kg)   SpO2 97%   BMI 30.99 kg/m    Right hand Ecchymosis or edema: Swelling relatively diffusely dorsally ROM wrist/hand/digits: Restricted at 4 and 5  carpals, MCP's, digits: Metacarpal heads are notably tender at fourth and fifth Distal Ulna and Radius: NT No instability Cysts/nodules: neg Digit triggering: neg Snuffbox tenderness: neg Scaphoid  tubercle: NT Full composite fist, no malrotation Grip, all digits: 5/5 str DIPJT: NT PIP JT: NT MCP JT: Fourth and fifth, tender to palpation No tenosynovitis Atrophy: neg  Hand sensation: intact   Radiology: DG Hand Complete Right  Result Date: 07/04/2020 CLINICAL DATA:  Pain following fall EXAM: RIGHT HAND - COMPLETE 3+ VIEW COMPARISON:  None. FINDINGS: Frontal, oblique, and lateral views were obtained. No fracture or dislocation. Joint spaces appear normal. No erosive change. IMPRESSION: No fracture or dislocation.  No appreciable arthropathic change. Electronically Signed   By: Lowella Grip III M.D.   On: 07/04/2020 08:35     Assessment and Plan:     ICD-10-CM   1. Right hand pain  M79.641 DG Hand Complete Right  2. Accidental fall, initial encounter  W19.XXXA DG Hand Complete Right   No fracture.  Soft tissue injury as well as some bone bruising.  Work on active range of motion, ice and ice and elevation.  Tylenol as needed.  He should do well.  No orders of the defined types were placed in this encounter.  Medications Discontinued During This Encounter  Medication Reason  . cephALEXin (KEFLEX) 500 MG capsule Completed Course   Orders Placed This Encounter  Procedures  . DG Hand Complete Right    Follow-up: No follow-ups on file.  Signed,  Maud Deed. Donn Wilmot, MD   Outpatient Encounter Medications as of 07/04/2020  Medication Sig  . amLODipine (  NORVASC) 10 MG tablet TAKE 1 TABLET EVERY DAY  . aspirin 81 MG tablet Take 81 mg by mouth daily.  Marland Kitchen latanoprost (XALATAN) 0.005 % ophthalmic solution Place 1 drop into both eyes at bedtime.   Marland Kitchen loratadine (CLARITIN) 10 MG tablet Take 10 mg by mouth daily as needed for allergies.  Marland Kitchen timolol (TIMOPTIC) 0.5 % ophthalmic solution Place 1 drop into both eyes daily.   . [DISCONTINUED] cephALEXin (KEFLEX) 500 MG capsule Take 1 capsule (500 mg total) by mouth 3 (three) times daily.   No facility-administered encounter  medications on file as of 07/04/2020.

## 2020-07-04 ENCOUNTER — Other Ambulatory Visit: Payer: Self-pay

## 2020-07-04 ENCOUNTER — Ambulatory Visit (INDEPENDENT_AMBULATORY_CARE_PROVIDER_SITE_OTHER): Payer: Medicare HMO | Admitting: Family Medicine

## 2020-07-04 ENCOUNTER — Encounter: Payer: Self-pay | Admitting: Family Medicine

## 2020-07-04 ENCOUNTER — Ambulatory Visit (INDEPENDENT_AMBULATORY_CARE_PROVIDER_SITE_OTHER)
Admission: RE | Admit: 2020-07-04 | Discharge: 2020-07-04 | Disposition: A | Discharge status: Home / Self Care | Payer: Medicare HMO | Source: Ambulatory Visit | Attending: Family Medicine | Admitting: Family Medicine

## 2020-07-04 VITALS — BP 114/60 | HR 70 | Temp 97.4°F | Ht 72.0 in | Wt 228.5 lb

## 2020-07-04 DIAGNOSIS — W19XXXA Unspecified fall, initial encounter: Secondary | ICD-10-CM | POA: Diagnosis not present

## 2020-07-04 DIAGNOSIS — M79641 Pain in right hand: Secondary | ICD-10-CM | POA: Diagnosis not present

## 2020-07-10 ENCOUNTER — Other Ambulatory Visit: Payer: Self-pay

## 2020-07-10 ENCOUNTER — Ambulatory Visit (AMBULATORY_SURGERY_CENTER): Payer: Medicare HMO | Admitting: Internal Medicine

## 2020-07-10 ENCOUNTER — Encounter: Payer: Self-pay | Admitting: Internal Medicine

## 2020-07-10 VITALS — BP 144/77 | HR 78 | Temp 97.3°F | Resp 14 | Ht 71.0 in | Wt 228.2 lb

## 2020-07-10 DIAGNOSIS — Z86010 Personal history of colonic polyps: Secondary | ICD-10-CM

## 2020-07-10 DIAGNOSIS — Z1211 Encounter for screening for malignant neoplasm of colon: Secondary | ICD-10-CM | POA: Diagnosis not present

## 2020-07-10 MED ORDER — FLEET ENEMA 7-19 GM/118ML RE ENEM
1.0000 | ENEMA | Freq: Once | RECTAL | Status: AC
  Administered 2020-07-10: 1 via RECTAL

## 2020-07-10 MED ORDER — SODIUM CHLORIDE 0.9 % IV SOLN: 500.0000 mL | Freq: Once | INTRAVENOUS | Status: DC

## 2020-07-10 NOTE — Progress Notes (Signed)
Pt's states no medical or surgical changes since previsit or office visit. 

## 2020-07-10 NOTE — Progress Notes (Signed)
A/ox3, pleased with MAC, report to RN 

## 2020-07-10 NOTE — Op Note (Signed)
[facility] Patient Name: Matthew Carter Procedure Date: 07/10/2020 9:44 AM MRN: [id number] Endoscopist: Gatha Mayer , MD Age: 79 Referring MD:  Date of Birth: [birth year] Gender: Male Account #: [id number] Procedure:                Colonoscopy Indications:              Surveillance: Personal history of adenomatous                            polyps on last colonoscopy > 5 years ago Medicines:                Propofol per Anesthesia, Monitored Anesthesia Care Procedure:                Pre-Anesthesia Assessment:                           - Prior to the procedure, a History and Physical                            was performed, and patient medications and                            allergies were reviewed. The patient's tolerance of                            previous anesthesia was also reviewed. The risks                            and benefits of the procedure and the sedation                            options and risks were discussed with the patient.                            All questions were answered, and informed consent                            was obtained. Prior Anticoagulants: The patient has                            taken no previous anticoagulant or antiplatelet                            agents. ASA Grade Assessment: II - A patient with                            mild systemic disease. After reviewing the risks                            and benefits, the patient was deemed in                            satisfactory condition to undergo the procedure.  After obtaining informed consent, the colonoscope                            was passed under direct vision. Throughout the                            procedure, the patient's blood pressure, pulse, and                            oxygen saturations were monitored continuously. The                            Colonoscope was introduced through the anus and                             advanced to the the cecum, identified by                            appendiceal orifice and ileocecal valve. The                            colonoscopy was performed with moderate difficulty                            due to a redundant colon and significant looping.                            Successful completion of the procedure was aided by                            straightening and shortening the scope to obtain                            bowel loop reduction and applying abdominal                            pressure. The patient tolerated the procedure well.                            The quality of the bowel preparation was good. The                            ileocecal valve, appendiceal orifice, and rectum                            were photographed. The bowel preparation used was                            Miralax via split dose instruction. Scope In: 10:00:03 AM Scope Out: 10:18:44 AM Scope Withdrawal Time: 0 hours 7 minutes 55 seconds  Total Procedure Duration: 0 hours 18 minutes 41 seconds  Findings:                 The perianal and digital rectal examinations were  normal.                           Scattered diverticula were found in the ascending                            colon.                           The exam was otherwise without abnormality on                            direct and retroflexion views. Complications:            No immediate complications. Estimated Blood Loss:     Estimated blood loss: none. Impression:               - Mild diverticulosis in the ascending colon.                           - The examination was otherwise normal on direct                            and retroflexion views.                           - No specimens collected.                           - Personal history of colonic polyps.                           2010 - 7 polyps 6 removed And then 3 cm TV sessile                            adenoma Sigmoid  removed                           2011 1 cm and 6 mm adenomas                           06/28/2013 3 diminutive right colon polyps removed                            - 2 diminutive sessile serated polyps Recommendation:           - Patient has a contact number available for                            emergencies. The signs and symptoms of potential                            delayed complications were discussed with the                            patient. Return to normal activities tomorrow.  Written discharge instructions were provided to the                            patient.                           - Resume previous diet.                           - Continue present medications.                           - No repeat colonoscopy due to age and the absence                            of colonic polyps. Gatha Mayer, MD 07/10/2020 10:27:09 AM This report has been signed electronically.

## 2020-07-10 NOTE — Patient Instructions (Addendum)
No polyps found today.  You do not need a further routine repeat colonoscopy.  I will be here if you have problems that require evaluation though for your sake, I hope that is not necessary.  I appreciate the opportunity to care for you.  Gatha Mayer, MD, [facility]  Handout given for diverticulosis.   YOU HAD AN ENDOSCOPIC PROCEDURE TODAY AT [facility]:   Refer to the procedure report that was given to you for any specific questions about what was found during the examination.  If the procedure report does not answer your questions, please call your gastroenterologist to clarify.  If you requested that your care partner not be given the details of your procedure findings, then the procedure report has been included in a sealed envelope for you to review at your convenience later.  YOU SHOULD EXPECT: Some feelings of bloating in the abdomen. Passage of more gas than usual.  Walking can help get rid of the air that was put into your GI tract during the procedure and reduce the bloating. If you had a lower endoscopy (such as a colonoscopy or flexible sigmoidoscopy) you may notice spotting of blood in your stool or on the toilet paper. If you underwent a bowel prep for your procedure, you may not have a normal bowel movement for a few days.  Please Note:  You might notice some irritation and congestion in your nose or some drainage.  This is from the oxygen used during your procedure.  There is no need for concern and it should clear up in a day or so.  SYMPTOMS TO REPORT IMMEDIATELY:   Following lower endoscopy (colonoscopy or flexible sigmoidoscopy):  Excessive amounts of blood in the stool  Significant tenderness or worsening of abdominal pains  Swelling of the abdomen that is new, acute  Fever of 100F or higher  For urgent or emergent issues, a gastroenterologist can be reached at any hour by calling [phone]. Do not use MyChart messaging for urgent concerns.     DIET:  We do recommend a small meal at first, but then you may proceed to your regular diet.  Drink plenty of fluids but you should avoid alcoholic beverages for 24 hours.  ACTIVITY:  You should plan to take it easy for the rest of today and you should NOT DRIVE or use heavy machinery until tomorrow (because of the sedation medicines used during the test).    FOLLOW UP: Our staff will call the number listed on your records 48-72 hours following your procedure to check on you and address any questions or concerns that you may have regarding the information given to you following your procedure. If we do not reach you, we will leave a message.  We will attempt to reach you two times.  During this call, we will ask if you have developed any symptoms of COVID 19. If you develop any symptoms (ie: fever, flu-like symptoms, shortness of breath, cough etc.) before then, please call [phone].  If you test positive for Covid 19 in the 2 weeks post procedure, please call and report this information to [place].    If any biopsies were taken you will be contacted by phone or by letter within the next 1-3 weeks.  Please call us at [phone] if you have not heard about the biopsies in 3 weeks.    SIGNATURES/CONFIDENTIALITY: You and/or your care partner have signed paperwork which will be entered into your electronic medical record.  These signatures attest to the fact that that the information above on your After Visit Summary has been reviewed and is understood.  Full responsibility of the confidentiality of this discharge information lies with you and/or your care-partner.

## 2020-07-12 ENCOUNTER — Telehealth: Payer: Self-pay

## 2020-07-12 NOTE — Telephone Encounter (Signed)
Covid-19 screening questions   Do you now or have you had a fever in the last 14 days? No.  Do you have any respiratory symptoms of shortness of breath or cough now or in the last 14 days? No.  Do you have any family members or close contacts with diagnosed or suspected Covid-19 in the past 14 days? No.  Have you been tested for Covid-19 and found to be positive? No.       Follow up Call-  Call back number 07/10/2020  Post procedure Call Back phone  # [phone]  Permission to leave phone message Yes  Some recent data might be hidden     Patient questions:  Do you have a fever, pain , or abdominal swelling? No. Pain Score  0 *  Have you tolerated food without any problems? Yes.    Have you been able to return to your normal activities? Yes.    Do you have any questions about your discharge instructions: Diet   No. Medications  No. Follow up visit  No.  Do you have questions or concerns about your Care? No.  Actions: * If pain score is 4 or above: No action needed, pain <4.

## 2020-08-13 DIAGNOSIS — H401131 Primary open-angle glaucoma, bilateral, mild stage: Secondary | ICD-10-CM | POA: Diagnosis not present

## 2020-09-13 ENCOUNTER — Other Ambulatory Visit: Payer: Self-pay | Admitting: Urology

## 2020-09-24 DIAGNOSIS — H401132 Primary open-angle glaucoma, bilateral, moderate stage: Secondary | ICD-10-CM | POA: Diagnosis not present

## 2020-09-26 ENCOUNTER — Other Ambulatory Visit: Payer: Self-pay | Admitting: Urology

## 2020-10-04 ENCOUNTER — Encounter: Payer: Self-pay | Admitting: Urology

## 2020-10-04 ENCOUNTER — Ambulatory Visit: Payer: Medicare HMO | Admitting: Urology

## 2020-10-04 ENCOUNTER — Other Ambulatory Visit: Payer: Self-pay

## 2020-10-04 VITALS — BP 145/73 | HR 80 | Ht 71.0 in | Wt 228.0 lb

## 2020-10-04 DIAGNOSIS — Z8551 Personal history of malignant neoplasm of bladder: Secondary | ICD-10-CM

## 2020-10-04 NOTE — Progress Notes (Signed)
   10/04/20   CC:  Chief Complaint  Patient presents with  . Cysto    HPI: Matthew Carter is a 80 y.o. male with personal history of bladder cancer returns for a cystoscopy   Patient is s/p TURBT 01/2017 with 3.5 cm right anterior bladder wall tumor. Surgical pathology consistent with HG T1 TCC. Lamina propria invasion was focal. He declined repeat TURBT.   He underwent induction BCG x 6 04/2017 and more recently after his maintenance BCG completed 11/2017. Unfortunately, maintenance was unable to be continued in light of national backorder.More recently, he completed BCG x2 (missed third dose for some reason) 03/2019.  Restaging imaging in the form of CT urogram on 01/14/2019 showed mild diffuse bladder wall thickening but no evidence of GU malignancy.  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 Responded well to topical steroids. Asymptomatic.  Last cysto 01/11/20 showed a small scar in the central area with an inflamed appearance. Urine cytology showed negative for high-grade urothelial carcinoma.  Follow-up repeat cystoscopy a month later showed that this area had actually improved.  Urine cytology was negative.  He denies any urinary symptoms today.  He does mention that his brother-in-law was recently diagnosed with prostate cancer and some questions for me about that.  Blood pressure (!) 145/73, pulse 80, height 5\' 11"  (1.803 m), weight 228 lb (103.4 kg). NED. A&Ox3.   No respiratory distress   Abd soft, NT, ND Normal phallus with bilateral descended testicles  Cystoscopy Procedure Note  Patient identification was confirmed, informed consent was obtained, and patient was prepped using Betadine solution.  Lidocaine jelly was administered per urethral meatus.     Pre-Procedure: - Inspection reveals a normal caliber ureteral meatus.  Procedure: The flexible cystoscope was introduced without difficulty - No  urethral strictures/lesions are present. - Enlarged prostate with trilobar coaptation - Normal bladder neck - Stellate scar on posterior bladder wall but in the central area of this particular scar - Bladder mildly trabeculated, no lesions or tumors - Bilateral ureteral orifices identified - No bladder stones - Mild trabeculation  Retroflexion unremarkable   Post-Procedure: - Patient tolerated the procedure well  Assessment/ Plan:  1. Bladder erythema REsovled  Urine cytology otday  2. History of bladder cancer Continue every 6 month surveillance  Consider repeating upper tract imaging after next appointment, last imaging in 2020  We did have a discussion today about checking a PSA.  He has not had a PSA in the recent past but we discussed the guidelines of age-related screening.  Hesitate to check this following cystoscopy as it would likely be falsely elevated.  He may consider having this done with his routine labs by his PCP.  If not, we can consider doing it before his next cystoscopy.  He is agreeable this plan.  RTC in 6 months for cysto and sooner if he experiences symptoms   Hollice Espy, MD

## 2020-10-05 LAB — URINALYSIS, COMPLETE
Bilirubin, UA: NEGATIVE
Glucose, UA: NEGATIVE
Ketones, UA: NEGATIVE
Nitrite, UA: NEGATIVE
Protein,UA: NEGATIVE
Specific Gravity, UA: 1.015 (ref 1.005–1.030)
Urobilinogen, Ur: 1 mg/dL (ref 0.2–1.0)
pH, UA: 7 (ref 5.0–7.5)

## 2020-10-05 LAB — MICROSCOPIC EXAMINATION

## 2020-10-08 LAB — CYTOLOGY - NON PAP

## 2020-12-05 ENCOUNTER — Other Ambulatory Visit: Payer: Self-pay

## 2020-12-05 ENCOUNTER — Encounter: Payer: Self-pay | Admitting: Internal Medicine

## 2020-12-05 ENCOUNTER — Ambulatory Visit (INDEPENDENT_AMBULATORY_CARE_PROVIDER_SITE_OTHER): Payer: Medicare HMO | Admitting: Internal Medicine

## 2020-12-05 DIAGNOSIS — M25561 Pain in right knee: Secondary | ICD-10-CM | POA: Diagnosis not present

## 2020-12-05 NOTE — Progress Notes (Signed)
Subjective:    Patient ID: Matthew Carter, male    DOB: 04-23-1941, 80 y.o.   MRN: [id number]  HPI Here due to right knee pain This visit occurred during the SARS-CoV-2 public health emergency.  Safety protocols were in place, including screening questions prior to the visit, additional usage of staff PPE, and extensive cleaning of exam room while observing appropriate contact time as indicated for disinfecting solutions.   Feels pain in the back of his right knee Hurts when getting out of chair and at first when he starts walking Then it improves Similar to symptoms from several years ago Noticed it about 2 weeks ago  Did have some symptoms before--like trying to get out of car Hasn't cut back on activities  Doesn't seem to be swollen No known injury or new tasks  Tried some rubbing alcohol--may have helped some No other Rx--no oral meds  Current Outpatient Medications on File Prior to Visit  Medication Sig Dispense Refill  . amLODipine (NORVASC) 10 MG tablet TAKE 1 TABLET EVERY DAY 90 tablet 3  . dorzolamide-timolol (COSOPT) 22.3-6.8 MG/ML ophthalmic solution     . latanoprost (XALATAN) 0.005 % ophthalmic solution Place 1 drop into both eyes at bedtime.     Marland Kitchen loratadine (CLARITIN) 10 MG tablet Take 10 mg by mouth daily as needed for allergies.    Marland Kitchen aspirin 81 MG tablet Take 81 mg by mouth daily. (Patient not taking: Reported on 12/05/2020)     No current facility-administered medications on file prior to visit.    No Known Allergies  Past Medical History:  Diagnosis Date  . Adenomatous polyps   . Allergy   . Anemia   . Cancer ([place]) 2017   bladder  . Cataract   . ED (erectile dysfunction)   . Glaucoma   . Glucose intolerance (impaired glucose tolerance)   . Hemorrhoids   . Hypertension     Past Surgical History:  Procedure Laterality Date  . COLONOSCOPY    . CYSTOSCOPY N/A 04/28/2018   Procedure: Flexible CYSTOSCOPY;  Surgeon: Hollice Espy, MD;  Location:  [facility];  Service: Urology;  Laterality: N/A;  will be using flexible cystoscope  . PENILE BIOPSY N/A 04/28/2018   Procedure: PENILE BIOPSY;  Surgeon: Hollice Espy, MD;  Location: [facility];  Service: Urology;  Laterality: N/A;  . TRANSURETHRAL RESECTION OF BLADDER TUMOR N/A 01/09/2017   Procedure: TRANSURETHRAL RESECTION OF BLADDER TUMOR (TURBT)-(MEDIUM);  Surgeon: Nickie Retort, MD;  Location: [facility];  Service: Urology;  Laterality: N/A;    Family History  Problem Relation Age of Onset  . GER disease Mother   . Hypertension Mother   . Kidney failure Mother   . Stroke Father   . Hypertension Father   . Cancer Paternal Grandfather        Leukemia  . Diabetes Paternal Grandfather   . Cancer Brother        throat cancer  . Colon cancer Neg Hx   . Prostate cancer Neg Hx   . Bladder Cancer Neg Hx   . Esophageal cancer Neg Hx   . Stomach cancer Neg Hx   . Rectal cancer Neg Hx     Social History   Socioeconomic History  . Marital status: Married    Spouse name: Not on file  . Number of children: 3  . Years of education: Not on file  . Highest education level: Not on file  Occupational History  . Occupation: Retired Furniture conservator/restorer  Employer: [employer]  . Occupation: Surveyor, quantity    Comment: part time  . Occupation: Estate manager/land agent part time    Comment: [employer]  Tobacco Use  . Smoking status: Former Smoker    Quit date: 08/04/1981    Years since quitting: 39.3  . Smokeless tobacco: Never Used  Vaping Use  . Vaping Use: Never used  Substance and Sexual Activity  . Alcohol use: No    Alcohol/week: 0.0 standard drinks  . Drug use: No  . Sexual activity: Not on file  Other Topics Concern  . Not on file  Social History Narrative   Has living will    No formal health care POA but requests wife--alternate is son   Would accept resuscitation attempts but no prolonged artificial ventilation   Would accept a feeding tube   Social Determinants of Health    Financial Resource Strain: Not on file  Food Insecurity: Not on file  Transportation Needs: Not on file  Physical Activity: Not on file  Stress: Not on file  Social Connections: Not on file  Intimate Partner Violence: Not on file   Review of Systems  No other joint issues Stable mild foot swelling--uses compression socks at times (no change)     Objective:   Physical Exam Constitutional:      Appearance: Normal appearance.  Musculoskeletal:     Comments: No effusions in knees No Baker's cyst or other mass on right knee Mild reduced flexion--slightly tight No crepitus No ligament instability Slight pain once with maximal medial meniscus testing---didn't recur though  Neurological:     Mental Status: He is alert.     Comments: Normal gait No leg weakness            Assessment & Plan:

## 2020-12-05 NOTE — Patient Instructions (Signed)
Please try over the counter diclofenac gel three times a day on your knee. If it worsens, or fails to improve, let me know and I can set you up with an orthopedist.

## 2020-12-05 NOTE — Assessment & Plan Note (Signed)
Likely just a strain Doesn't seem to have underlying OA-and no ligament findings ?very slight medial meniscus tear---findings not conclusive  Will ask him to try diclofenac gel for now If worsens, can set up with ortho

## 2020-12-10 ENCOUNTER — Telehealth: Payer: Self-pay

## 2020-12-10 DIAGNOSIS — M25561 Pain in right knee: Secondary | ICD-10-CM

## 2020-12-10 NOTE — Addendum Note (Signed)
Addended by: Viviana Simpler I on: 12/10/2020 01:11 PM   Modules accepted: Orders

## 2020-12-10 NOTE — Telephone Encounter (Signed)
Please let him know that I made a referral to an orthopedic surgeon here in [place]. He should hear about this in the next few days

## 2020-12-10 NOTE — Telephone Encounter (Signed)
Pt has tried OTC Voltaren Gel and Tylenol with no relief in knee pain and now back hurts... He stated you told him to let you know if Sx did not improve

## 2020-12-10 NOTE — Telephone Encounter (Signed)
Tried to call pt. No answer and no VM 

## 2020-12-11 NOTE — Telephone Encounter (Signed)
Pt is aware as instructed 

## 2020-12-17 DIAGNOSIS — M5430 Sciatica, unspecified side: Secondary | ICD-10-CM | POA: Diagnosis not present

## 2020-12-17 DIAGNOSIS — M25561 Pain in right knee: Secondary | ICD-10-CM | POA: Diagnosis not present

## 2021-02-02 ENCOUNTER — Other Ambulatory Visit: Payer: Self-pay | Admitting: Internal Medicine

## 2021-02-06 ENCOUNTER — Ambulatory Visit (INDEPENDENT_AMBULATORY_CARE_PROVIDER_SITE_OTHER): Payer: Medicare HMO | Admitting: Internal Medicine

## 2021-02-06 ENCOUNTER — Encounter: Payer: Self-pay | Admitting: Internal Medicine

## 2021-02-06 VITALS — BP 108/68 | HR 65 | Temp 97.7°F | Ht 72.0 in | Wt 222.0 lb

## 2021-02-06 DIAGNOSIS — R200 Anesthesia of skin: Secondary | ICD-10-CM | POA: Diagnosis not present

## 2021-02-06 DIAGNOSIS — R600 Localized edema: Secondary | ICD-10-CM | POA: Diagnosis not present

## 2021-02-06 DIAGNOSIS — I10 Essential (primary) hypertension: Secondary | ICD-10-CM

## 2021-02-06 LAB — CBC
HCT: 37.4 % — ABNORMAL LOW (ref 39.0–52.0)
Hemoglobin: 12.4 g/dL — ABNORMAL LOW (ref 13.0–17.0)
MCHC: 33.2 g/dL (ref 30.0–36.0)
MCV: 85.7 fl (ref 78.0–100.0)
Platelets: 259 10*3/uL (ref 150.0–400.0)
RBC: 4.36 Mil/uL (ref 4.22–5.81)
RDW: 14.2 % (ref 11.5–15.5)
WBC: 4.7 10*3/uL (ref 4.0–10.5)

## 2021-02-06 LAB — COMPREHENSIVE METABOLIC PANEL
ALT: 10 U/L (ref 0–53)
AST: 13 U/L (ref 0–37)
Albumin: 3.7 g/dL (ref 3.5–5.2)
Alkaline Phosphatase: 103 U/L (ref 39–117)
BUN: 9 mg/dL (ref 6–23)
CO2: 33 mEq/L — ABNORMAL HIGH (ref 19–32)
Calcium: 9.8 mg/dL (ref 8.4–10.5)
Chloride: 102 mEq/L (ref 96–112)
Creatinine, Ser: 1.16 mg/dL (ref 0.40–1.50)
GFR: 60.36 mL/min (ref >60.00)
Glucose, Bld: 99 mg/dL (ref 70–99)
Potassium: 4.3 mEq/L (ref 3.5–5.1)
Sodium: 138 mEq/L (ref 135–145)
Total Bilirubin: 0.4 mg/dL (ref 0.2–1.2)
Total Protein: 7 g/dL (ref 6.0–8.3)

## 2021-02-06 LAB — VITAMIN B12: Vitamin B-12: 232 pg/mL (ref 211–911)

## 2021-02-06 LAB — T4, FREE: Free T4: 0.99 ng/dL (ref 0.60–1.60)

## 2021-02-06 MED ORDER — AMLODIPINE BESYLATE 5 MG PO TABS
5.0000 mg | ORAL_TABLET | Freq: Every day | ORAL | 3 refills | Status: DC
Start: 2021-02-06 — End: 2021-02-25

## 2021-02-06 NOTE — Assessment & Plan Note (Signed)
BP Readings from Last 3 Encounters:  02/06/21 108/68  12/05/20 120/78  10/04/20 (!) 145/73   BP better lately Will try reducing the dose of the amlodipine given the edema Consider adding HCTZ if goes up

## 2021-02-06 NOTE — Assessment & Plan Note (Signed)
Likely from the amlodipine Will try lower dose Check labs Nothing to suggest CHF

## 2021-02-06 NOTE — Progress Notes (Signed)
Subjective:    Patient ID: Matthew Carter, male    DOB: [birth year], 80 y.o.   MRN: [id number]  HPI Here due to leg swelling This visit occurred during the SARS-CoV-2 public health emergency.  Safety protocols were in place, including screening questions prior to the visit, additional usage of staff PPE, and extensive cleaning of exam room while observing appropriate contact time as indicated for disinfecting solutions.   He notes R>L calf swelling (wife thinks it is the same) He thinks it is the amlodipine Has been on the 10mg  for a couple of years  Some pain when driving--no right heel Some numbness on bottom of his foot also This sensory change goes back 2 years or so Uses wool blanket at night due to cold feeling (even in the summer)  No SOB No chest pain Still does yard work---no sig change in stamina Swelling still there in the morning Does wear compression socks daily  Current Outpatient Medications on File Prior to Visit  Medication Sig Dispense Refill   amLODipine (NORVASC) 10 MG tablet TAKE 1 TABLET EVERY DAY 90 tablet 3   aspirin 81 MG tablet Take 81 mg by mouth daily.     dorzolamide-timolol (COSOPT) 22.3-6.8 MG/ML ophthalmic solution      latanoprost (XALATAN) 0.005 % ophthalmic solution Place 1 drop into both eyes at bedtime.      loratadine (CLARITIN) 10 MG tablet Take 10 mg by mouth daily as needed for allergies.     No current facility-administered medications on file prior to visit.    No Known Allergies  Past Medical History:  Diagnosis Date   Adenomatous polyps    Allergy    Anemia    Cancer ([place]) 2017   bladder   Cataract    ED (erectile dysfunction)    Glaucoma    Glucose intolerance (impaired glucose tolerance)    Hemorrhoids    Hypertension     Past Surgical History:  Procedure Laterality Date   COLONOSCOPY     CYSTOSCOPY N/A 04/28/2018   Procedure: Flexible CYSTOSCOPY;  Surgeon: Hollice Espy, MD;  Location: [facility];  Service:  Urology;  Laterality: N/A;  will be using flexible cystoscope   PENILE BIOPSY N/A 04/28/2018   Procedure: PENILE BIOPSY;  Surgeon: Hollice Espy, MD;  Location: [facility];  Service: Urology;  Laterality: N/A;   TRANSURETHRAL RESECTION OF BLADDER TUMOR N/A 01/09/2017   Procedure: TRANSURETHRAL RESECTION OF BLADDER TUMOR (TURBT)-(MEDIUM);  Surgeon: Nickie Retort, MD;  Location: [facility];  Service: Urology;  Laterality: N/A;    Family History  Problem Relation Age of Onset   GER disease Mother    Hypertension Mother    Kidney failure Mother    Stroke Father    Hypertension Father    Cancer Paternal Grandfather        Leukemia   Diabetes Paternal Grandfather    Cancer Brother        throat cancer   Colon cancer Neg Hx    Prostate cancer Neg Hx    Bladder Cancer Neg Hx    Esophageal cancer Neg Hx    Stomach cancer Neg Hx    Rectal cancer Neg Hx     Social History   Socioeconomic History   Marital status: Married    Spouse name: Not on file   Number of children: 3   Years of education: Not on file   Highest education level: Not on file  Occupational History   Occupation: Retired  Machinist    Employer: [employer]   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: part time   Occupation: Estate manager/land agent part time    Comment: [employer]  Tobacco Use   Smoking status: Former    Pack years: 0.00    Types: Cigarettes    Quit date: 08/04/1981    Years since quitting: 39.5   Smokeless tobacco: Never  Vaping Use   Vaping Use: Never used  Substance and Sexual Activity   Alcohol use: No    Alcohol/week: 0.0 standard drinks   Drug use: No   Sexual activity: Not on file  Other Topics Concern   Not on file  Social History Narrative   Has living will    No formal health care POA but requests wife--alternate is son   Would accept resuscitation attempts but no prolonged artificial ventilation   Would accept a feeding tube   Social Determinants of Health   Financial  Resource Strain: Not on file  Food Insecurity: Not on file  Transportation Needs: Not on file  Physical Activity: Not on file  Stress: Not on file  Social Connections: Not on file  Intimate Partner Violence: Not on file   Review of Systems BP at home--- reports normal  Tries to watch salt No change in weight     Objective:   Physical Exam Constitutional:      Appearance: Normal appearance.  Cardiovascular:     Rate and Rhythm: Normal rate and regular rhythm.     Pulses: Normal pulses.     Heart sounds: No murmur heard.   No gallop.  Pulmonary:     Effort: Pulmonary effort is normal.     Breath sounds: Normal breath sounds. No wheezing or rales.  Musculoskeletal:     Cervical back: Neck supple.     Comments: 1+ edema in ankles only  Lymphadenopathy:     Cervical: No cervical adenopathy.  Neurological:     Mental Status: He is alert.           Assessment & Plan:

## 2021-02-06 NOTE — Assessment & Plan Note (Signed)
Not new but is more noticeable Will just check labs

## 2021-02-06 NOTE — Patient Instructions (Signed)
Cut the current amlodipine in half and only take 5mg  daily. I have sent a new prescription for the lower dose. Monitor your blood pressure once a week or so---and bring in a record of the numbers you get.

## 2021-02-14 LAB — PROTEIN ELECTROPHORESIS, SERUM, WITH REFLEX
Albumin ELP: 3.5 g/dL — ABNORMAL LOW (ref 3.8–4.8)
Alpha 1: 0.3 g/dL (ref 0.2–0.3)
Alpha 2: 0.7 g/dL (ref 0.5–0.9)
Beta 2: 0.5 g/dL (ref 0.2–0.5)
Beta Globulin: 0.5 g/dL (ref 0.4–0.6)
Gamma Globulin: 1.4 g/dL (ref 0.8–1.7)
Total Protein: 6.9 g/dL (ref 6.1–8.1)

## 2021-02-14 LAB — IFE INTERPRETATION: Immunofix Electr Int: NOT DETECTED

## 2021-02-25 ENCOUNTER — Telehealth: Payer: Self-pay

## 2021-02-25 MED ORDER — AMLODIPINE BESYLATE 5 MG PO TABS: 5.0000 mg | ORAL_TABLET | Freq: Every day | ORAL | 3 refills | Status: DC

## 2021-02-25 NOTE — Telephone Encounter (Signed)
Called pt to verify if he wanted medication sent to [facility] as the fax requested. He does. Rx sent electronically.

## 2021-03-13 ENCOUNTER — Other Ambulatory Visit: Payer: Medicare HMO

## 2021-03-19 ENCOUNTER — Encounter: Payer: Self-pay | Admitting: Internal Medicine

## 2021-03-19 ENCOUNTER — Other Ambulatory Visit: Payer: Self-pay

## 2021-03-19 ENCOUNTER — Ambulatory Visit (INDEPENDENT_AMBULATORY_CARE_PROVIDER_SITE_OTHER): Payer: Medicare HMO | Admitting: Internal Medicine

## 2021-03-19 VITALS — BP 124/76 | HR 60 | Temp 97.6°F | Ht 71.0 in | Wt 219.0 lb

## 2021-03-19 DIAGNOSIS — H4010X0 Unspecified open-angle glaucoma, stage unspecified: Secondary | ICD-10-CM | POA: Diagnosis not present

## 2021-03-19 DIAGNOSIS — I10 Essential (primary) hypertension: Secondary | ICD-10-CM

## 2021-03-19 DIAGNOSIS — G250 Essential tremor: Secondary | ICD-10-CM | POA: Diagnosis not present

## 2021-03-19 DIAGNOSIS — I872 Venous insufficiency (chronic) (peripheral): Secondary | ICD-10-CM

## 2021-03-19 DIAGNOSIS — J301 Allergic rhinitis due to pollen: Secondary | ICD-10-CM | POA: Diagnosis not present

## 2021-03-19 DIAGNOSIS — Z7189 Other specified counseling: Secondary | ICD-10-CM

## 2021-03-19 DIAGNOSIS — Z0000 Encounter for general adult medical examination without abnormal findings: Secondary | ICD-10-CM | POA: Diagnosis not present

## 2021-03-19 NOTE — Assessment & Plan Note (Signed)
Mild No Rx indicated

## 2021-03-19 NOTE — Assessment & Plan Note (Signed)
Uses the loratadine for pollen and perfume exposures

## 2021-03-19 NOTE — Assessment & Plan Note (Signed)
Uses the drops and follows with Dr Wallace Going

## 2021-03-19 NOTE — Assessment & Plan Note (Signed)
I have personally reviewed the Medicare Annual Wellness questionnaire and have noted 1. The patient's medical and social history 2. Their use of alcohol, tobacco or illicit drugs 3. Their current medications and supplements 4. The patient's functional ability including ADL's, fall risks, home safety risks and hearing or visual             impairment. 5. Diet and physical activities 6. Evidence for depression or mood disorders  The patients weight, height, BMI and visual acuity have been recorded in the chart I have made referrals, counseling and provided education to the patient based review of the above and I have provided the pt with a written personalized care plan for preventive services.  I have provided you with a copy of your personalized plan for preventive services. Please take the time to review along with your updated medication list.  Just had normal colon--done with screening No PSA due to age Discussed exercise[facility] Consider shingrix if gets covered Bivalent COVID booster when available Flu vaccine soon

## 2021-03-19 NOTE — Assessment & Plan Note (Signed)
BP Readings from Last 3 Encounters:  03/19/21 124/76  02/06/21 108/68  12/05/20 120/78   Good control on lower dose amlodipine  Recent labs okay

## 2021-03-19 NOTE — Assessment & Plan Note (Signed)
See social history 

## 2021-03-19 NOTE — Progress Notes (Signed)
Hearing Screening - Comments:: Has hearing aids. Not wearing them today. Vision Screening - Comments:: March 2022

## 2021-03-19 NOTE — Assessment & Plan Note (Signed)
Much of his swelling apparently from the amlodipine No sig swelling on lower dose

## 2021-03-19 NOTE — Progress Notes (Signed)
Subjective:    Patient ID: Matthew Carter, male    DOB: 1941-04-24, 80 y.o.   MRN: [id number]  HPI Here for Medicare wellness visit and follow up of chronic health conditions This visit occurred during the SARS-CoV-2 public health emergency.  Safety protocols were in place, including screening questions prior to the visit, additional usage of staff PPE, and extensive cleaning of exam room while observing appropriate contact time as indicated for disinfecting solutions.   Reviewed advanced directives Reviewed other doctors---Dr Heide Scales, Dr Novella Olive, Dr Archie Balboa, Dr Delrae Rend No alcohol  No tobacco Vision is okay---goes back next month Hearing aides---are helpful No falls No depression or anhedonia Not really doing any exercise No hospitalizations or surgery in the past year Independent with instrumental ADLs No sig memory problems  Swelling is better on lower dose amlodipine Monitoring BP at home---forgot his records Usually 120's/70's No chest pain or SOB No dizziness or syncope No palpitations  Slight worsening of foot numbness Notices it mostly in the car---okay with walking Trying to increase dietary B12  No recent issues with tremor Just very mild in right hand No family history  Sees Dr Erlene Quan for yearly cystoscopy Voids okay. No dribbling  Current Outpatient Medications on File Prior to Visit  Medication Sig Dispense Refill   amLODipine (NORVASC) 5 MG tablet Take 1 tablet (5 mg total) by mouth daily. 90 tablet 3   aspirin 81 MG tablet Take 81 mg by mouth daily.     dorzolamide-timolol (COSOPT) 22.3-6.8 MG/ML ophthalmic solution      latanoprost (XALATAN) 0.005 % ophthalmic solution Place 1 drop into both eyes at bedtime.      loratadine (CLARITIN) 10 MG tablet Take 10 mg by mouth daily as needed for allergies.     No current facility-administered medications on file prior to visit.    No Known Allergies  Past Medical History:   Diagnosis Date   Adenomatous polyps    Allergy    Anemia    Cancer ([place]) 2017   bladder   Cataract    ED (erectile dysfunction)    Glaucoma    Glucose intolerance (impaired glucose tolerance)    Hemorrhoids    Hypertension     Past Surgical History:  Procedure Laterality Date   COLONOSCOPY     CYSTOSCOPY N/A 04/28/2018   Procedure: Flexible CYSTOSCOPY;  Surgeon: Hollice Espy, MD;  Location: [facility];  Service: Urology;  Laterality: N/A;  will be using flexible cystoscope   PENILE BIOPSY N/A 04/28/2018   Procedure: PENILE BIOPSY;  Surgeon: Hollice Espy, MD;  Location: [facility];  Service: Urology;  Laterality: N/A;   TRANSURETHRAL RESECTION OF BLADDER TUMOR N/A 01/09/2017   Procedure: TRANSURETHRAL RESECTION OF BLADDER TUMOR (TURBT)-(MEDIUM);  Surgeon: Nickie Retort, MD;  Location: [facility];  Service: Urology;  Laterality: N/A;    Family History  Problem Relation Age of Onset   GER disease Mother    Hypertension Mother    Kidney failure Mother    Stroke Father    Hypertension Father    Cancer Paternal Grandfather        Leukemia   Diabetes Paternal Grandfather    Cancer Brother        throat cancer   Colon cancer Neg Hx    Prostate cancer Neg Hx    Bladder Cancer Neg Hx    Esophageal cancer Neg Hx    Stomach cancer Neg Hx    Rectal cancer Neg Hx  Social History   Socioeconomic History   Marital status: Married    Spouse name: Not on file   Number of children: 3   Years of education: Not on file   Highest education level: Not on file  Occupational History   Occupation: Retired Information systems manager: [employer]   Occupation: Surveyor, quantity    Comment: part time   Occupation: Estate manager/land agent part time    Comment: [employer]  Tobacco Use   Smoking status: Former    Types: Cigarettes    Quit date: 08/04/1981    Years since quitting: 39.6   Smokeless tobacco: Never  Vaping Use   Vaping Use: Never used  Substance and Sexual Activity    Alcohol use: No    Alcohol/week: 0.0 standard drinks   Drug use: No   Sexual activity: Not on file  Other Topics Concern   Not on file  Social History Narrative   Has living will    No formal health care POA but requests wife--alternate is son   Would accept resuscitation attempts but no prolonged artificial ventilation   Would accept a feeding tube   Social Determinants of Health   Financial Resource Strain: Not on file  Food Insecurity: Not on file  Transportation Needs: Not on file  Physical Activity: Not on file  Stress: Not on file  Social Connections: Not on file  Intimate Partner Violence: Not on file   Review of Systems Appetite is good Weight down 10# from last year--?fluid Sleeps well Wears seat belt Full dentures---no dentist visits No skin lesions of concern No heartburn or dysphagia Bowels okay--no blood Did have slight low back catch this morning--no persistent back pain. No other joint issues now     Objective:   Physical Exam Constitutional:      Appearance: Normal appearance.  HENT:     Mouth/Throat:     Comments: No lesions Eyes:     Conjunctiva/sclera: Conjunctivae normal.     Pupils: Pupils are equal, round, and reactive to light.  Cardiovascular:     Rate and Rhythm: Normal rate and regular rhythm.     Pulses: Normal pulses.     Heart sounds: No murmur heard.   No gallop.  Pulmonary:     Effort: Pulmonary effort is normal.     Breath sounds: Normal breath sounds. No wheezing or rales.  Abdominal:     Palpations: Abdomen is soft.     Tenderness: There is no abdominal tenderness.  Musculoskeletal:     Cervical back: Neck supple.     Comments: No sig edema now  Lymphadenopathy:     Cervical: No cervical adenopathy.  Skin:    General: Skin is warm.     Findings: No rash.  Neurological:     Mental Status: He is alert and oriented to person, place, and time.     Comments: President--- "Zoila Shutter,  Obama" [id number] Recall 3/3  Psychiatric:        Mood and Affect: Mood normal.        Behavior: Behavior normal.           Assessment & Plan:

## 2021-04-08 NOTE — Progress Notes (Incomplete)
   04/09/2021   CC: No chief complaint on file.   HPI: Matthew Carter is a 80 y.o. male with a personal history of bladder cancer who returns today for 6 month follow-up cystoscopy.   He is s/p TURBT on 01/2017 with 3.5 cm right anterior bladder wall tumor. 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 Lamina propria invasion was focal.   He underwent BCG x 6 04/2017 and more recently after his maintenance BCG completed 11/2017. Unfortunately, maintenance was unable to be continued in light of national backorder.   More recently, he completed BCG x2 (missed third dose for some reason)  03/2019.  O 01/13/2021 restaging imaging in the form of CT urogram showed mild duffuse bladder wall thickening but no evidence of GU malignancy.   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 Responded well to topical steroids. Asymptomatic.     There were no vitals filed for this visit. NED. A&Ox3.   No respiratory distress   Abd soft, NT, ND Normal phallus with bilateral descended testicles  Cystoscopy Procedure Note  Patient identification was confirmed, informed consent was obtained, and patient was prepped using Betadine solution.  Lidocaine jelly was administered per urethral meatus.     Pre-Procedure: - Inspection reveals a normal caliber ureteral meatus.  Procedure: The flexible cystoscope was introduced without difficulty - No urethral strictures/lesions are present. - {Blank multiple:19197::"Enlarged","Surgically absent","Normal"} prostate *** - {Blank multiple:19197::"Normal","Elevated","Tight"} bladder neck - Bilateral ureteral orifices identified - Bladder mucosa  reveals no ulcers, tumors, or lesions - No bladder stones - No trabeculation  Retroflexion shows ***   Post-Procedure: - Patient tolerated the procedure well  Assessment/ Plan: . History of bladder cancer   No follow-ups on file.  I,Kailey  Littlejohn,acting as a Education administrator for Hollice Espy, MD.,have documented all relevant documentation on the behalf of Hollice Espy, MD,as directed by  Hollice Espy, MD while in the presence of Hollice Espy, MD.

## 2021-04-09 ENCOUNTER — Other Ambulatory Visit: Payer: Self-pay | Admitting: Urology

## 2021-04-22 NOTE — Progress Notes (Signed)
   04/22/2021   CC:  Chief Complaint  Patient presents with   Cysto    HPI: Matthew Carter is a 80 y.o. male with has a personal history of bladder cancer, who presents today for a cystoscopy.   He is s/p TURBT 01/2017 with 3.5 cm right anterior bladder wall tumor. Surgical pathology was consistent with HG T1 TCC.   He underwent BCG x6 induction 04/2017 and completed it 11/2017. He most recently in 03/2019 completed BCG x2.   01/14/2019 CT urogram sowed mild diffuse bladder wall thickening but no GU malignancy.   We have been following the patient closely for an area of erythema which since resolved.  His urine cytology remains negative.  He also has a personal history of erythematous penile lesion s/p biopsy in 03/2017 that as consistent with Zoons balanitis.   He denies any urinary symptoms today.   Vitals:   04/23/21 1051  BP: (!) 158/78  Pulse: 71  NED. A&Ox3.   No respiratory distress   Abd soft, NT, ND Normal phallus with bilateral descended testicles  Cystoscopy Procedure Note  Patient identification was confirmed, informed consent was obtained, and patient was prepped using Betadine solution.  Lidocaine jelly was administered per urethral meatus.     Pre-Procedure: - Inspection reveals a normal caliber ureteral meatus.  Procedure: The flexible cystoscope was introduced without difficulty - No urethral strictures/lesions are present. - Enlarged prostate  - Normal bladder neck - Stellate scar on posterior bladder wall  - Bilateral ureteral orifices identified - Bladder mucosa  reveals no ulcers, tumors, or lesions - No bladder stones - mildly trabeculated   Retroflexion shows unremarkable   Post-Procedure: - Patient tolerated the procedure well  Assessment/ Plan:  History of bladder cancer  - Continue surveillance every 1 year given lack of recurrence since 2018 -Urine cytology today - Cystoscopy today was unremarkable   Return in about 1 year (around  04/23/2022).  I,Kailey Littlejohn,acting as a Education administrator for Hollice Espy, MD.,have documented all relevant documentation on the behalf of Hollice Espy, MD,as directed by  Hollice Espy, MD while in the presence of Hollice Espy, MD.  I have reviewed the above documentation for accuracy and completeness, and I agree with the above.   Hollice Espy, MD

## 2021-04-23 ENCOUNTER — Ambulatory Visit: Payer: Medicare HMO | Admitting: Urology

## 2021-04-23 ENCOUNTER — Other Ambulatory Visit: Payer: Self-pay

## 2021-04-23 ENCOUNTER — Encounter: Payer: Self-pay | Admitting: Urology

## 2021-04-23 VITALS — BP 158/78 | HR 71 | Ht 71.0 in | Wt 219.0 lb

## 2021-04-23 DIAGNOSIS — Z8551 Personal history of malignant neoplasm of bladder: Secondary | ICD-10-CM | POA: Diagnosis not present

## 2021-04-24 LAB — URINALYSIS, COMPLETE
Bilirubin, UA: NEGATIVE
Glucose, UA: NEGATIVE
Ketones, UA: NEGATIVE
Nitrite, UA: NEGATIVE
Protein,UA: NEGATIVE
RBC, UA: NEGATIVE
Specific Gravity, UA: 1.015 (ref 1.005–1.030)
Urobilinogen, Ur: 1 mg/dL (ref 0.2–1.0)
pH, UA: 7 (ref 5.0–7.5)

## 2021-04-24 LAB — MICROSCOPIC EXAMINATION: Bacteria, UA: NONE SEEN

## 2021-04-24 LAB — CYTOLOGY - NON PAP

## 2021-05-03 ENCOUNTER — Ambulatory Visit (INDEPENDENT_AMBULATORY_CARE_PROVIDER_SITE_OTHER): Payer: Medicare HMO

## 2021-05-03 ENCOUNTER — Other Ambulatory Visit: Payer: Self-pay

## 2021-05-03 DIAGNOSIS — Z23 Encounter for immunization: Secondary | ICD-10-CM | POA: Diagnosis not present

## 2021-05-29 DIAGNOSIS — H524 Presbyopia: Secondary | ICD-10-CM | POA: Diagnosis not present

## 2021-06-04 DIAGNOSIS — Z01818 Encounter for other preprocedural examination: Secondary | ICD-10-CM | POA: Diagnosis not present

## 2021-06-04 DIAGNOSIS — H401131 Primary open-angle glaucoma, bilateral, mild stage: Secondary | ICD-10-CM | POA: Diagnosis not present

## 2021-06-04 DIAGNOSIS — H25813 Combined forms of age-related cataract, bilateral: Secondary | ICD-10-CM | POA: Diagnosis not present

## 2021-06-04 DIAGNOSIS — H25812 Combined forms of age-related cataract, left eye: Secondary | ICD-10-CM | POA: Diagnosis not present

## 2021-06-04 DIAGNOSIS — H25811 Combined forms of age-related cataract, right eye: Secondary | ICD-10-CM | POA: Diagnosis not present

## 2021-06-20 DIAGNOSIS — H401111 Primary open-angle glaucoma, right eye, mild stage: Secondary | ICD-10-CM | POA: Diagnosis not present

## 2021-06-20 DIAGNOSIS — H401131 Primary open-angle glaucoma, bilateral, mild stage: Secondary | ICD-10-CM | POA: Diagnosis not present

## 2021-06-20 DIAGNOSIS — H25811 Combined forms of age-related cataract, right eye: Secondary | ICD-10-CM | POA: Diagnosis not present

## 2021-07-15 ENCOUNTER — Ambulatory Visit (INDEPENDENT_AMBULATORY_CARE_PROVIDER_SITE_OTHER): Payer: Medicare HMO | Admitting: Internal Medicine

## 2021-07-15 ENCOUNTER — Other Ambulatory Visit: Payer: Self-pay

## 2021-07-15 ENCOUNTER — Encounter: Payer: Self-pay | Admitting: Internal Medicine

## 2021-07-15 DIAGNOSIS — L089 Local infection of the skin and subcutaneous tissue, unspecified: Secondary | ICD-10-CM | POA: Diagnosis not present

## 2021-07-15 DIAGNOSIS — L723 Sebaceous cyst: Secondary | ICD-10-CM | POA: Diagnosis not present

## 2021-07-15 MED ORDER — CEPHALEXIN 500 MG PO CAPS: 500.0000 mg | ORAL_CAPSULE | Freq: Three times a day (TID) | ORAL | 1 refills | Status: DC

## 2021-07-15 NOTE — Assessment & Plan Note (Signed)
Mildly infected Has had some drainage Discussed warm compresses to help get complete drainage Cephalexin x 5 days

## 2021-07-15 NOTE — Progress Notes (Signed)
Subjective:    Patient ID: Matthew Carter, male    DOB: 1940/10/31, 80 y.o.   MRN: [id number]  HPI Here due to cyst in left armpit It has burst and he continually "squeezes on it"  First noticed it 2 weeks ago He pushed it and was able to get some cheesy drainage No pain No redness  Current Outpatient Medications on File Prior to Visit  Medication Sig Dispense Refill   amLODipine (NORVASC) 5 MG tablet Take 1 tablet (5 mg total) by mouth daily. 90 tablet 3   aspirin 81 MG tablet Take 81 mg by mouth daily.     dorzolamide-timolol (COSOPT) 22.3-6.8 MG/ML ophthalmic solution      latanoprost (XALATAN) 0.005 % ophthalmic solution Place 1 drop into both eyes at bedtime.      loratadine (CLARITIN) 10 MG tablet Take 10 mg by mouth daily as needed for allergies.     No current facility-administered medications on file prior to visit.    No Known Allergies  Past Medical History:  Diagnosis Date   Adenomatous polyps    Allergy    Anemia    Cancer ([place]) 2017   bladder   Cataract    ED (erectile dysfunction)    Glaucoma    Glucose intolerance (impaired glucose tolerance)    Hemorrhoids    Hypertension     Past Surgical History:  Procedure Laterality Date   COLONOSCOPY     CYSTOSCOPY N/A 04/28/2018   Procedure: Flexible CYSTOSCOPY;  Surgeon: Hollice Espy, MD;  Location: [facility];  Service: Urology;  Laterality: N/A;  will be using flexible cystoscope   PENILE BIOPSY N/A 04/28/2018   Procedure: PENILE BIOPSY;  Surgeon: Hollice Espy, MD;  Location: [facility];  Service: Urology;  Laterality: N/A;   TRANSURETHRAL RESECTION OF BLADDER TUMOR N/A 01/09/2017   Procedure: TRANSURETHRAL RESECTION OF BLADDER TUMOR (TURBT)-(MEDIUM);  Surgeon: Nickie Retort, MD;  Location: [facility];  Service: Urology;  Laterality: N/A;    Family History  Problem Relation Age of Onset   GER disease Mother    Hypertension Mother    Kidney failure Mother    Stroke Father    Hypertension Father     Cancer Paternal Grandfather        Leukemia   Diabetes Paternal Grandfather    Cancer Brother        throat cancer   Colon cancer Neg Hx    Prostate cancer Neg Hx    Bladder Cancer Neg Hx    Esophageal cancer Neg Hx    Stomach cancer Neg Hx    Rectal cancer Neg Hx     Social History   Socioeconomic History   Marital status: Married    Spouse name: Not on file   Number of children: 3   Years of education: Not on file   Highest education level: Not on file  Occupational History   Occupation: Retired Information systems manager: [employer]   Occupation: Surveyor, quantity    Comment: part time   Occupation: Estate manager/land agent part time    Comment: [employer]  Tobacco Use   Smoking status: Former    Types: Cigarettes    Quit date: 08/04/1981    Years since quitting: 39.9   Smokeless tobacco: Never  Vaping Use   Vaping Use: Never used  Substance and Sexual Activity   Alcohol use: No    Alcohol/week: 0.0 standard drinks   Drug use: No   Sexual activity: Not  on file  Other Topics Concern   Not on file  Social History Narrative   Has living will    No formal health care POA but requests wife--alternate is son   Would accept resuscitation attempts but no prolonged artificial ventilation   Would accept a feeding tube   Social Determinants of Health   Financial Resource Strain: Not on file  Food Insecurity: Not on file  Transportation Needs: Not on file  Physical Activity: Not on file  Stress: Not on file  Social Connections: Not on file  Intimate Partner Violence: Not on file   Review of Systems No fever No new deodorant ---stopped using it due to this     Objective:   Physical Exam Constitutional:      Appearance: Normal appearance.  Musculoskeletal:     Comments: Small mass in left axilla---with 2 small protrusions. Mildly tender Some whitish pus extruded from one  Neurological:     Mental Status: He is alert.           Assessment & Plan:

## 2021-08-15 DIAGNOSIS — H401131 Primary open-angle glaucoma, bilateral, mild stage: Secondary | ICD-10-CM | POA: Diagnosis not present

## 2021-08-15 DIAGNOSIS — H2512 Age-related nuclear cataract, left eye: Secondary | ICD-10-CM | POA: Diagnosis not present

## 2021-08-15 DIAGNOSIS — H401121 Primary open-angle glaucoma, left eye, mild stage: Secondary | ICD-10-CM | POA: Diagnosis not present

## 2021-08-15 DIAGNOSIS — H25812 Combined forms of age-related cataract, left eye: Secondary | ICD-10-CM | POA: Diagnosis not present

## 2021-09-01 IMAGING — DX DG HAND COMPLETE 3+V*R*
3 series · 3 of 3 positions shown · non-contrast
Comparison: None.

CLINICAL DATA: Pain following fall

EXAM:
RIGHT HAND - COMPLETE 3+ VIEW

[hand ap]
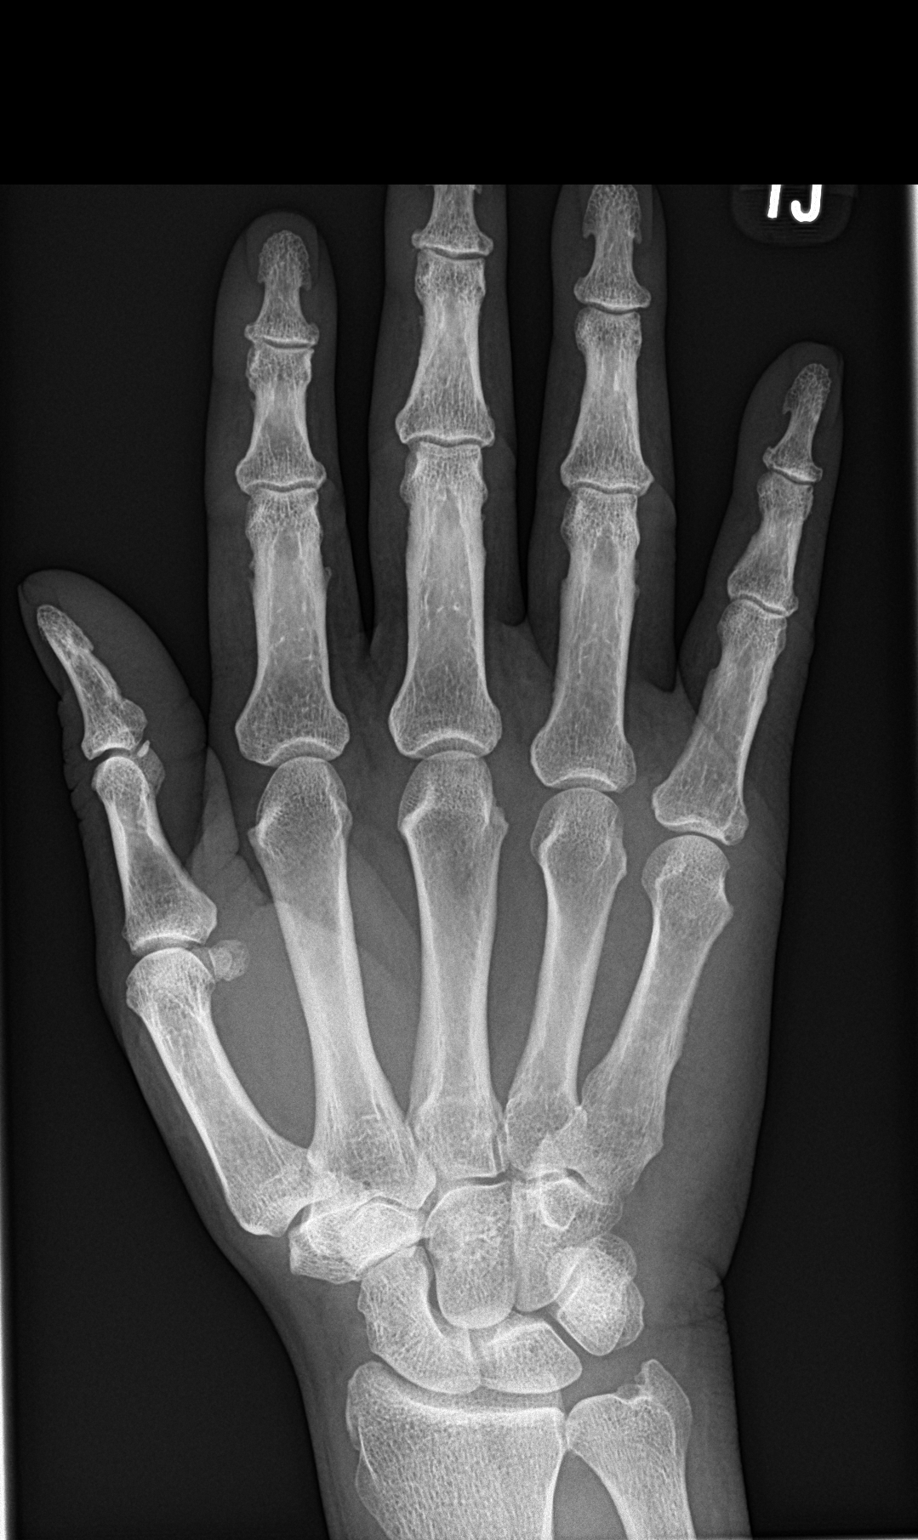

[hand obl]
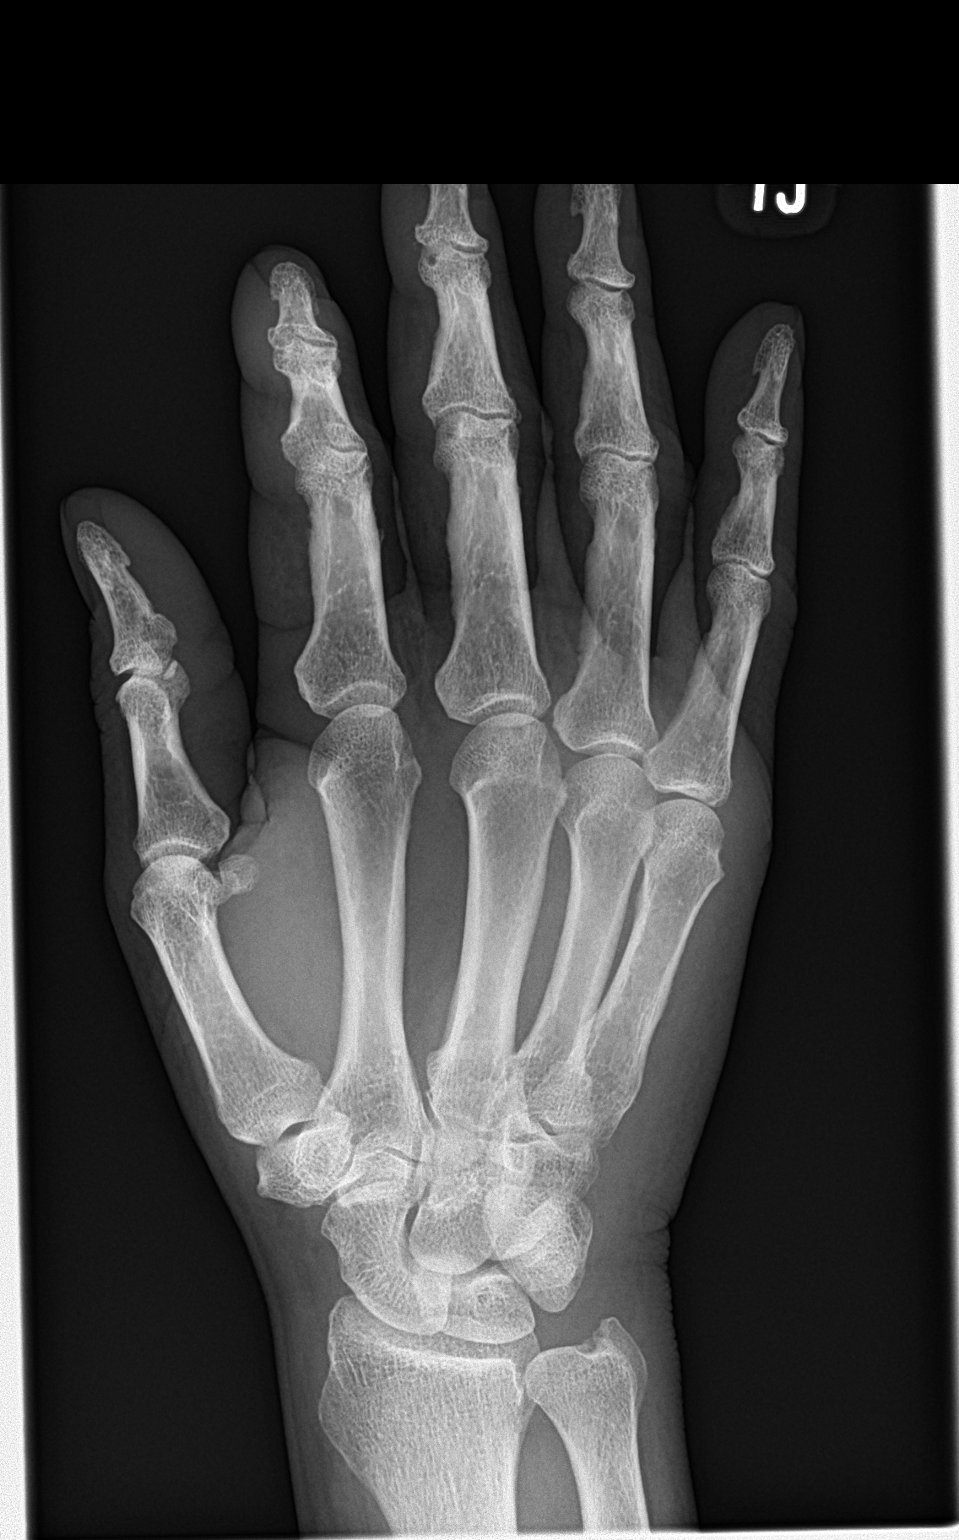

[hand lat]
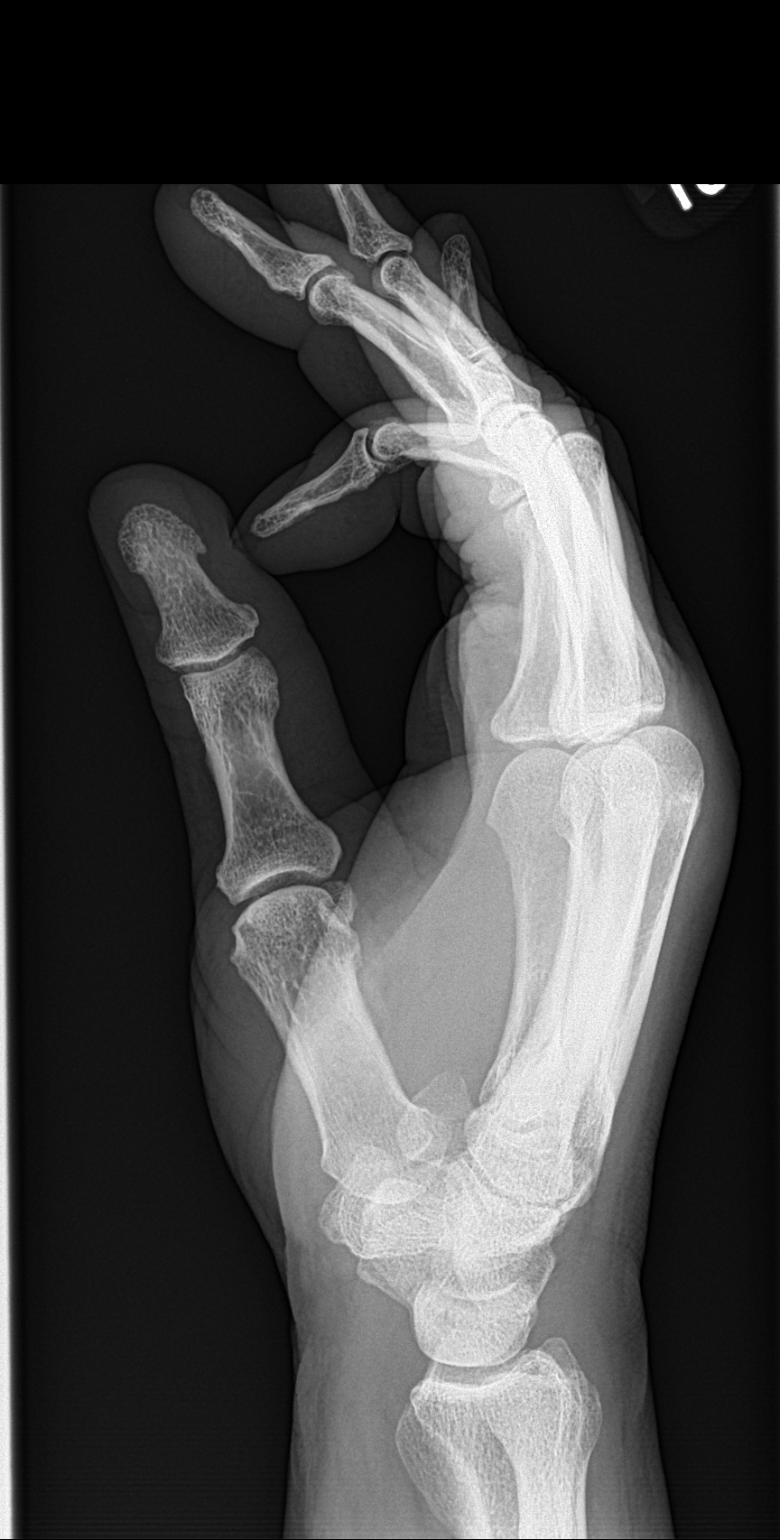

[3 of 3 positions shown; findings below may reference images not displayed]

FINDINGS: Frontal, oblique, and lateral views were obtained. No fracture or
dislocation. Joint spaces appear normal. No erosive change.
IMPRESSION: No fracture or dislocation.  No appreciable arthropathic change.

## 2021-09-13 DIAGNOSIS — H52223 Regular astigmatism, bilateral: Secondary | ICD-10-CM | POA: Diagnosis not present

## 2021-09-13 DIAGNOSIS — H524 Presbyopia: Secondary | ICD-10-CM | POA: Diagnosis not present

## 2021-10-17 DIAGNOSIS — H401132 Primary open-angle glaucoma, bilateral, moderate stage: Secondary | ICD-10-CM | POA: Diagnosis not present

## 2021-12-15 ENCOUNTER — Other Ambulatory Visit: Payer: Self-pay | Admitting: Internal Medicine

## 2021-12-31 ENCOUNTER — Ambulatory Visit (INDEPENDENT_AMBULATORY_CARE_PROVIDER_SITE_OTHER): Payer: Medicare HMO | Admitting: Nurse Practitioner

## 2021-12-31 ENCOUNTER — Encounter: Payer: Self-pay | Admitting: Nurse Practitioner

## 2021-12-31 ENCOUNTER — Telehealth: Payer: Self-pay

## 2021-12-31 VITALS — BP 126/72 | HR 73 | Temp 97.6°F | Resp 14 | Ht 71.0 in | Wt 220.0 lb

## 2021-12-31 DIAGNOSIS — W57XXXA Bitten or stung by nonvenomous insect and other nonvenomous arthropods, initial encounter: Secondary | ICD-10-CM | POA: Diagnosis not present

## 2021-12-31 DIAGNOSIS — S20462A Insect bite (nonvenomous) of left back wall of thorax, initial encounter: Secondary | ICD-10-CM

## 2021-12-31 NOTE — Progress Notes (Signed)
   Acute Office Visit  Subjective:     Patient ID: Matthew Carter, male    DOB: October 17, 1940, 81 y.o.   MRN: [id number]  Chief Complaint  Patient presents with   Insect Bite    Noticed tick on 12/28/21 on his back, not sure if all has been removed. Itching present.    HPI Patient is in today for Tick bite  States they noticed it on 12/28/2021.  States did pull the tick off Saturday. Ithcing. Denies numbness, tingling, headaches or fever. Use alcohol and hydrogen peroxide.    Review of Systems  Constitutional:  Negative for chills and fever.  Skin:  Negative for rash.  Neurological:  Negative for tingling and headaches.       Objective:    BP 126/72   Pulse 73   Temp 97.6 F (36.4 C)   Resp 14   Ht '5\' 11"'$  (1.803 m)   Wt 220 lb (99.8 kg)   SpO2 95%   BMI 30.68 kg/m    Physical Exam Cardiovascular:     Rate and Rhythm: Normal rate and regular rhythm.     Heart sounds: Normal heart sounds.  Pulmonary:     Effort: Pulmonary effort is normal.     Breath sounds: Normal breath sounds.  Skin:    Findings: Lesion present. No erythema.         No results found for any visits on 12/31/21.      Assessment & Plan:   Problem List Items Addressed This Visit       Musculoskeletal and Integument   Tick bite of left back wall of thorax - Primary    No indication for antibiotics at this time.  Did use sterile tweezers along with alcohol to remove scab to see if there is retained foreign bodies from tick.  No foreign bodies identified no bleeding in office.  Did encourage patient to keep clean with soap and water keep an eye on the place return for any worsening or concerning symptoms.        No orders of the defined types were placed in this encounter.   No follow-ups on file.  Romilda Garret, NP

## 2021-12-31 NOTE — Telephone Encounter (Signed)
[facility] Night - Client Nonclinical Telephone Record  [facility] Client [facility] Night - Client Client Site [place] Provider Viviana Simpler- MD Contact Type Call Who Is Calling Patient / Member / Family / Caregiver Caller Name [place] Phone Number [phone] Patient Name Matthew Carter Patient DOB 08/30/1940 Call Type Message Only Information Provided Reason for Call Request to Schedule Office Appointment Initial Comment Caller states, had a tick since Sat. Not sure if he got it all out. Needs to be seen today. Patient request to speak to RN No Additional Comment Caller declined triage. Disp. Time Disposition Final User 12/31/2021 8:04:32 AM General Information Provided Yes Jobie Quaker Call Closed By: Jobie Quaker Transaction Date/Time: 12/31/2021 8:01:44 AM (ET

## 2021-12-31 NOTE — Telephone Encounter (Addendum)
Pt has already had appt with Romilda Garret NP 12/31/21 at [place] RECORD [facility] Patient Name: Matthew Carter Gender: Male DOB: Sep 12, 1940 Age: 81 Y 80 M 4 D Return Phone Number: [phone] (Primary), [phone] (Secondary) Address: [place]/ State/ Zip: [place]  [address] Client [facility] Night - Client Client Site [place] - Night Provider Viviana Simpler- MD Contact Type Call Who Is Calling Patient / Member / Family / Caregiver Call Type Triage / Clinical Relationship To Patient Self Return Phone Number [phone] (Secondary) Chief Complaint Tick Bite Reason for Call Symptomatic / Request for Health Information Initial Comment Caller is itching, he states he may has taken the tick out. Translation No Nurse Assessment Nurse: Martyn Ehrich, RN, Felicia Date/Time (Eastern Time): 12/30/2021 1:37:23 PM Confirm and document reason for call. If symptomatic, describe symptoms. ---Pt had a tick bite that was not on there more than a day. He removed it yesterday - It is itching - it is on his back. No fever. Does the patient have any new or worsening symptoms? ---Yes Will a triage be completed? ---Yes Related visit to physician within the last 2 weeks? ---No Does the PT have any chronic conditions? (i.e. diabetes, asthma, this includes High risk factors for pregnancy, etc.) ---No Is this a behavioral health or substance abuse call? ---No Guidelines Guideline Title Affirmed Question Affirmed Notes Nurse Date/Time (Eastern Time) Tick Bite Deer tick bite with no complications Martyn Ehrich, RN, Felicia 0/34/9179 1:50:56 PM Disp. Time Eilene Ghazi Time) Disposition Final User 12/30/2021 1:43:52 PM Home Care Yes Gaddy, RN, Solmon Ice Caller Disagree/Comply Comply Caller Understands Yes PLEASE NOTE: All timestamps contained within this report are  represented as [place] Standard Time. CONFIDENTIALTY NOTICE: This fax transmission is intended only for the addressee. It contains information that is legally privileged, confidential or otherwise protected from use or disclosure. If you are not the intended recipient, you are strictly prohibited from reviewing, disclosing, copying using or disseminating any of this information or taking any action in reliance on or regarding this information. If you have received this fax in error, please notify us immediately by telephone so that we can arrange for its return to [place]. Phone: [phone], Toll-Free: [phone], Fax: [phone] Page: 2 of 2 Call Id: [id number] PreDisposition Did not know what to do Care Advice Given Per Guideline HOME CARE: * You should be able to treat this at home. * Most tick bites are harmless and can be treated at home. * The spread of disease by ticks is not common. * [place] the bite area and your hands with soap and water after removing the tick. ANTIBIOTIC OINTMENT: * Put a small amount of ANTIBIOTIC OINTMENT on the bite once. You can get this over-the-counter (OTC) at a drugstore. Use Bacitracin ointment (OTC in [place] or Polysporin ointment (OTC in [place]) or one that you already have. * Deer ticks are very small. They range in size from a poppy seed to an apple seed. * Fever or rash occur in the next 4 weeks * Bite begins to look infected * You become worse * Tick bites normally do not itch or hurt. That's why they often go unnoticed

## 2021-12-31 NOTE — Patient Instructions (Signed)
Nice to see you today Keep an eye on it. Clean it with soap and water If you develop fever, chills, numbness, tingling, headaches or the place develops a rash or starts having discharge follow back up with [place]

## 2021-12-31 NOTE — Assessment & Plan Note (Signed)
No indication for antibiotics at this time.  Did use sterile tweezers along with alcohol to remove scab to see if there is retained foreign bodies from tick.  No foreign bodies identified no bleeding in office.  Did encourage patient to keep clean with soap and water keep an eye on the place return for any worsening or concerning symptoms.

## 2022-01-09 ENCOUNTER — Other Ambulatory Visit (HOSPITAL_COMMUNITY): Payer: Self-pay

## 2022-01-09 ENCOUNTER — Other Ambulatory Visit: Payer: Self-pay | Admitting: Internal Medicine

## 2022-01-09 MED ORDER — LATANOPROST 0.005 % OP SOLN
1.0000 [drp] | Freq: Every day | OPHTHALMIC | 0 refills | Status: AC
  Filled 2022-01-09 – 2022-05-22 (×2): qty 2.5, 25d supply, fill #0

## 2022-01-09 NOTE — Telephone Encounter (Signed)
Last office visit 12/31/21 with Romilda Garret for tick bite.  Last refilled??  Listed as historical on medication list.  CPE scheduled with Dr. Silvio Pate 03/25/22.

## 2022-03-25 ENCOUNTER — Encounter: Payer: Self-pay | Admitting: Internal Medicine

## 2022-03-25 ENCOUNTER — Ambulatory Visit (INDEPENDENT_AMBULATORY_CARE_PROVIDER_SITE_OTHER): Payer: Medicare HMO | Admitting: Internal Medicine

## 2022-03-25 VITALS — BP 126/72 | HR 74 | Temp 98.0°F | Ht 71.0 in | Wt 218.0 lb

## 2022-03-25 DIAGNOSIS — G250 Essential tremor: Secondary | ICD-10-CM

## 2022-03-25 DIAGNOSIS — I10 Essential (primary) hypertension: Secondary | ICD-10-CM

## 2022-03-25 DIAGNOSIS — H4010X0 Unspecified open-angle glaucoma, stage unspecified: Secondary | ICD-10-CM | POA: Diagnosis not present

## 2022-03-25 DIAGNOSIS — Z8551 Personal history of malignant neoplasm of bladder: Secondary | ICD-10-CM | POA: Diagnosis not present

## 2022-03-25 DIAGNOSIS — I872 Venous insufficiency (chronic) (peripheral): Secondary | ICD-10-CM | POA: Diagnosis not present

## 2022-03-25 DIAGNOSIS — Z0000 Encounter for general adult medical examination without abnormal findings: Secondary | ICD-10-CM | POA: Diagnosis not present

## 2022-03-25 DIAGNOSIS — R200 Anesthesia of skin: Secondary | ICD-10-CM | POA: Diagnosis not present

## 2022-03-25 LAB — TSH: TSH: 0.35 u[IU]/mL (ref 0.35–5.50)

## 2022-03-25 LAB — COMPREHENSIVE METABOLIC PANEL
ALT: 9 U/L (ref 0–53)
AST: 12 U/L (ref 0–37)
Albumin: 3.6 g/dL (ref 3.5–5.2)
Alkaline Phosphatase: 90 U/L (ref 39–117)
BUN: 14 mg/dL (ref 6–23)
CO2: 31 mEq/L (ref 19–32)
Calcium: 9.4 mg/dL (ref 8.4–10.5)
Chloride: 102 mEq/L (ref 96–112)
Creatinine, Ser: 1.16 mg/dL (ref 0.40–1.50)
GFR: 59.88 mL/min — ABNORMAL LOW (ref >60.00)
Glucose, Bld: 98 mg/dL (ref 70–99)
Potassium: 4.3 mEq/L (ref 3.5–5.1)
Sodium: 138 mEq/L (ref 135–145)
Total Bilirubin: 0.4 mg/dL (ref 0.2–1.2)
Total Protein: 6.5 g/dL (ref 6.0–8.3)

## 2022-03-25 LAB — CBC
HCT: 36.1 % — ABNORMAL LOW (ref 39.0–52.0)
Hemoglobin: 11.7 g/dL — ABNORMAL LOW (ref 13.0–17.0)
MCHC: 32.4 g/dL (ref 30.0–36.0)
MCV: 87.3 fl (ref 78.0–100.0)
Platelets: 238 10*3/uL (ref 150.0–400.0)
RBC: 4.14 Mil/uL — ABNORMAL LOW (ref 4.22–5.81)
RDW: 14.3 % (ref 11.5–15.5)
WBC: 4.2 10*3/uL (ref 4.0–10.5)

## 2022-03-25 LAB — VITAMIN B12: Vitamin B-12: 178 pg/mL — ABNORMAL LOW (ref 211–911)

## 2022-03-25 NOTE — Assessment & Plan Note (Signed)
Mild edema--better since amlodipine dose lowered No action for now

## 2022-03-25 NOTE — Assessment & Plan Note (Signed)
BP Readings from Last 3 Encounters:  03/25/22 126/72  12/31/21 126/72  07/15/21 140/74   Good control on amlodipine '5mg'$   Will check labs

## 2022-03-25 NOTE — Assessment & Plan Note (Signed)
I have personally reviewed the Medicare Annual Wellness questionnaire and have noted 1. The patient's medical and social history 2. Their use of alcohol, tobacco or illicit drugs 3. Their current medications and supplements 4. The patient's functional ability including ADL's, fall risks, home safety risks and hearing or visual             impairment. 5. Diet and physical activities 6. Evidence for depression or mood disorders  The patients weight, height, BMI and visual acuity have been recorded in the chart I have made referrals, counseling and provided education to the patient based review of the above and I have provided the pt with a written personalized care plan for preventive services.  I have provided you with a copy of your personalized plan for preventive services. Please take the time to review along with your updated medication list.  No cancer screening now---normal colon 2021 Discussed exercise---[facility] Had 1 shingrix--going for 2nd soon Updated COVID and flu in the fall

## 2022-03-25 NOTE — Assessment & Plan Note (Signed)
Mild No Rx

## 2022-03-25 NOTE — Assessment & Plan Note (Signed)
Gets a yearly cystoscopy--due soon

## 2022-03-25 NOTE — Assessment & Plan Note (Signed)
This has improved Will recheck labs

## 2022-03-25 NOTE — Assessment & Plan Note (Signed)
Does well with the dorzolamide and latanoprost

## 2022-03-25 NOTE — Progress Notes (Signed)
Subjective:    Patient ID: Matthew Carter, male    DOB: 1941/05/10, 81 y.o.   MRN: [id number]  HPI Here for Medicare wellness visit and follow up of chronic health conditions Reviewed advanced directives Reviewed other doctors---Dr Brasington--ophthal, Dr Novella Olive No surgery or hospitalizations in past year No alcohol or tobacco Walks a little--hasn't gotten back to the gym Hearing aides---needs them rechecked No falls No depression or anhedonia Independent with instrumental ADLs No sig memory problems  Doing okay Checks BP at times---fine No chest pain or SOB No dizziness or syncope No palpitations Does have slight foot swelling--on right. Stable Still takes aspirin--2-3 days per week  Vision okay Glaucoma under control  Sensory changes are better No longer taking B12  Yearly cystoscopy with Dr Erlene Quan Have been negative  Very mild tremor in right hand Doesn't bother him  Current Outpatient Medications on File Prior to Visit  Medication Sig Dispense Refill   amLODipine (NORVASC) 5 MG tablet TAKE 1 TABLET EVERY DAY 90 tablet 1   aspirin 81 MG tablet Take 81 mg by mouth daily.     dorzolamide-timolol (COSOPT) 22.3-6.8 MG/ML ophthalmic solution      latanoprost (XALATAN) 0.005 % ophthalmic solution Place 1 drop into both eyes at bedtime. 2.5 mL 0   loratadine (CLARITIN) 10 MG tablet Take 10 mg by mouth daily as needed for allergies.     No current facility-administered medications on file prior to visit.    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   Cataract    ED (erectile dysfunction)    Glaucoma    Glucose intolerance (impaired glucose tolerance)    Hemorrhoids    Hypertension     Past Surgical History:  Procedure Laterality Date   COLONOSCOPY     CYSTOSCOPY N/A 04/28/2018   Procedure: Flexible CYSTOSCOPY;  Surgeon: Hollice Espy, MD;  Location:  [facility] ORS;  Service: Urology;  Laterality: N/A;  will be using flexible cystoscope   PENILE BIOPSY N/A 04/28/2018   Procedure: PENILE BIOPSY;  Surgeon: Hollice Espy, MD;  Location: [facility] OR[facility];  Service: Urology;  Laterality: N/A;   TRANSURETHRAL RESECTION OF BLADDER TUMOR N/A 01/09/2017   Procedure: TRANSURETHRAL RESECTION OF BLADDER TUMOR (TURBT)-(MEDIUM);  Surgeon: Nickie Retort, MD;  Location: [facility] OR[facility];  Service: Urology;  Laterality: N/A;    Family History  Problem Relation Age of Onset   GER disease Mother    Hypertension Mother    Kidney failure Mother    Stroke Father    Hypertension Father    Cancer Paternal Grandfather        Leukemia   Diabetes Paternal Grandfather    Cancer Brother        throat cancer   Colon cancer Neg Hx    Prostate cancer Neg Hx    Bladder Cancer Neg Hx    Esophageal cancer Neg Hx    Stomach cancer Neg Hx    Rectal cancer Neg Hx     Social History   Socioeconomic History   Marital status: Married    Spouse name: Not on file   Number of children: 3   Years of education: Not on file   Highest education level: Not on file  Occupational History   Occupation: Retired Information systems manager: [employer]   Occupation: Surveyor, quantity  Comment: part time   Occupation: Cafeteria part time    Comment: [employer]  Tobacco Use   Smoking status: Former    Types: Cigarettes    Quit date: 08/04/1981    Years since quitting: 40.6    Passive exposure: Never   Smokeless tobacco: Never  Vaping Use   Vaping Use: Never used  Substance and Sexual Activity   Alcohol use: No    Alcohol/week: 0.0 standard drinks of alcohol   Drug use: No   Sexual activity: Not on file  Other Topics Concern   Not on file  Social History Narrative   Has living will    No formal health care POA but requests wife--alternate is son   Would accept resuscitation attempts but no prolonged artificial ventilation   Would accept a feeding tube   Social  Determinants of Health   Financial Resource Strain: Not on file  Food Insecurity: Not on file  Transportation Needs: Not on file  Physical Activity: Not on file  Stress: Not on file  Social Connections: Not on file  Intimate Partner Violence: Not on file   Review of Systems Appetite is good Weight stable Sleeps well Wears seat belt Dentures --doesn't need dentist No heartburn or dysphagia Bowels move fine--no blood Voids well. Stream okay--empties okay No suspicious skin lesions No sig back or joint pains now    Objective:   Physical Exam Constitutional:      Appearance: Normal appearance.  HENT:     Mouth/Throat:     Comments: No lesions Eyes:     Conjunctiva/sclera: Conjunctivae normal.     Pupils: Pupils are equal, round, and reactive to light.  Cardiovascular:     Rate and Rhythm: Normal rate and regular rhythm.     Pulses: Normal pulses.     Heart sounds: No murmur heard.    No gallop.  Pulmonary:     Effort: Pulmonary effort is normal.     Breath sounds: Normal breath sounds. No wheezing or rales.  Abdominal:     Palpations: Abdomen is soft.     Tenderness: There is no abdominal tenderness.  Musculoskeletal:     Cervical back: Neck supple.     Right lower leg: No edema.     Left lower leg: No edema.  Lymphadenopathy:     Cervical: No cervical adenopathy.  Skin:    Findings: No lesion or rash.  Neurological:     General: No focal deficit present.     Mental Status: He is alert and oriented to person, place, and time.  Psychiatric:        Mood and Affect: Mood normal.        Behavior: Behavior normal.            Assessment & Plan:

## 2022-03-25 NOTE — Progress Notes (Signed)
Hearing Screening - Comments:: Has hearing aids. Not wearing them today. Appt with audiologist 03-28-22 Vision Screening - Comments:: September 2022

## 2022-03-26 ENCOUNTER — Other Ambulatory Visit: Payer: Self-pay | Admitting: Internal Medicine

## 2022-03-26 DIAGNOSIS — E538 Deficiency of other specified B group vitamins: Secondary | ICD-10-CM

## 2022-03-27 ENCOUNTER — Other Ambulatory Visit: Payer: Self-pay

## 2022-04-15 ENCOUNTER — Ambulatory Visit (INDEPENDENT_AMBULATORY_CARE_PROVIDER_SITE_OTHER): Payer: Medicare HMO

## 2022-04-15 DIAGNOSIS — Z23 Encounter for immunization: Secondary | ICD-10-CM | POA: Diagnosis not present

## 2022-04-15 DIAGNOSIS — H401132 Primary open-angle glaucoma, bilateral, moderate stage: Secondary | ICD-10-CM | POA: Diagnosis not present

## 2022-04-23 ENCOUNTER — Other Ambulatory Visit: Payer: Medicare HMO | Admitting: Urology

## 2022-04-30 ENCOUNTER — Encounter: Payer: Self-pay | Admitting: Urology

## 2022-04-30 ENCOUNTER — Other Ambulatory Visit: Payer: Medicare HMO | Admitting: Urology

## 2022-04-30 DIAGNOSIS — Z8551 Personal history of malignant neoplasm of bladder: Secondary | ICD-10-CM

## 2022-05-22 ENCOUNTER — Other Ambulatory Visit (HOSPITAL_COMMUNITY): Payer: Self-pay

## 2022-05-23 ENCOUNTER — Ambulatory Visit: Payer: Medicare HMO | Admitting: Internal Medicine

## 2022-05-23 ENCOUNTER — Other Ambulatory Visit (HOSPITAL_COMMUNITY): Payer: Self-pay

## 2022-05-26 ENCOUNTER — Other Ambulatory Visit (HOSPITAL_COMMUNITY): Payer: Self-pay

## 2022-06-10 ENCOUNTER — Encounter: Payer: Self-pay | Admitting: Internal Medicine

## 2022-06-10 ENCOUNTER — Ambulatory Visit (INDEPENDENT_AMBULATORY_CARE_PROVIDER_SITE_OTHER): Payer: Medicare HMO | Admitting: Internal Medicine

## 2022-06-10 VITALS — BP 120/72 | HR 76 | Temp 97.2°F | Ht 71.0 in | Wt 218.0 lb

## 2022-06-10 DIAGNOSIS — G629 Polyneuropathy, unspecified: Secondary | ICD-10-CM | POA: Diagnosis not present

## 2022-06-10 DIAGNOSIS — E538 Deficiency of other specified B group vitamins: Secondary | ICD-10-CM

## 2022-06-10 LAB — VITAMIN B12: Vitamin B-12: 389 pg/mL (ref 211–911)

## 2022-06-10 NOTE — Assessment & Plan Note (Signed)
Will recheck levels If still low, will start injections

## 2022-06-10 NOTE — Progress Notes (Signed)
Subjective:    Patient ID: Matthew Carter, male    DOB: 07/29/1941, 81 y.o.   MRN: [id number]  HPI Here due to problems with right foot  When he drives --his right heel goes numb/some slight pain Doesn't have to stop Seems better once he gets out of car  Feet still numb Is back on the B12 101mg daily  Current Outpatient Medications on File Prior to Visit  Medication Sig Dispense Refill   amLODipine (NORVASC) 5 MG tablet TAKE 1 TABLET EVERY DAY 90 tablet 1   aspirin 81 MG tablet Take 81 mg by mouth daily.     Cyanocobalamin (B-12 PO) Take by mouth.  over the counter B12 500 mcg under your tongue or 1000 mcg oral every day     dorzolamide-timolol (COSOPT) 22.3-6.8 MG/ML ophthalmic solution      latanoprost (XALATAN) 0.005 % ophthalmic solution Place 1 drop into both eyes at bedtime. 2.5 mL 0   loratadine (CLARITIN) 10 MG tablet Take 10 mg by mouth daily as needed for allergies.     No current facility-administered medications on file prior to visit.    No Known Allergies  Past Medical History:  Diagnosis Date   Adenomatous polyps    Allergy    Anemia    Cancer ([place] 2017   bladder   Cataract    ED (erectile dysfunction)    Glaucoma    Glucose intolerance (impaired glucose tolerance)    Hemorrhoids    Hypertension     Past Surgical History:  Procedure Laterality Date   COLONOSCOPY     CYSTOSCOPY N/A 04/28/2018   Procedure: Flexible CYSTOSCOPY;  Surgeon: BHollice Espy MD;  Location: [facility];  Service: Urology;  Laterality: N/A;  will be using flexible cystoscope   PENILE BIOPSY N/A 04/28/2018   Procedure: PENILE BIOPSY;  Surgeon: BHollice Espy MD;  Location: [facility];  Service: Urology;  Laterality: N/A;   TRANSURETHRAL RESECTION OF BLADDER TUMOR N/A 01/09/2017   Procedure: TRANSURETHRAL RESECTION OF BLADDER TUMOR (TURBT)-(MEDIUM);  Surgeon: BNickie Retort MD;  Location: [facility];  Service: Urology;  Laterality: N/A;    Family History  Problem Relation  Age of Onset   GER disease Mother    Hypertension Mother    Kidney failure Mother    Stroke Father    Hypertension Father    Cancer Paternal Grandfather        Leukemia   Diabetes Paternal Grandfather    Cancer Brother        throat cancer   Colon cancer Neg Hx    Prostate cancer Neg Hx    Bladder Cancer Neg Hx    Esophageal cancer Neg Hx    Stomach cancer Neg Hx    Rectal cancer Neg Hx     Social History   Socioeconomic History   Marital status: Married    Spouse name: Not on file   Number of children: 3   Years of education: Not on file   Highest education level: Not on file  Occupational History   Occupation[place]ired MInformation systems manager [place]  Occupation: [employer]    Comment: part time   Occupation: CEstate manager/land agentpart time    Comment: [employer]  Tobacco Use   Smoking status: Former    Types: Cigarettes    Quit date: 08/04/1981    Years since quitting: 40.8    Passive exposure: Never   Smokeless tobacco: Never  Vaping Use  Vaping Use: Never used  Substance and Sexual Activity   Alcohol use: No    Alcohol/week: 0.0 standard drinks of alcohol   Drug use: No   Sexual activity: Not on file  Other Topics Concern   Not on file  Social History Narrative   Has living will    No formal health care POA but requests wife--alternate is son   Would accept resuscitation attempts but no prolonged artificial ventilation   Would accept a feeding tube   Social Determinants of Health   Financial Resource Strain: Not on file  Food Insecurity: Not on file  Transportation Needs: Not on file  Physical Activity: Not on file  Stress: Not on file  Social Connections: Not on file  Intimate Partner Violence: Not on file   Review of Systems No other sensory changes No AM heel pain     Objective:   Physical Exam Constitutional:      Appearance: Normal appearance.  Cardiovascular:     Pulses: Normal pulses.  Musculoskeletal:     Right  lower leg: No edema.     Left lower leg: No edema.     Comments: No foot tenderness  Neurological:     Mental Status: He is alert.            Assessment & Plan:

## 2022-06-10 NOTE — Assessment & Plan Note (Signed)
Discussed that this is likely related to the B12 deficiency Is back on this but need to make sure he is absorbing it Told him I don't expect remission of the sensory loss--but hopefully no progression

## 2022-07-24 ENCOUNTER — Telehealth: Payer: Self-pay | Admitting: Internal Medicine

## 2022-07-24 ENCOUNTER — Other Ambulatory Visit: Payer: Self-pay | Admitting: Internal Medicine

## 2022-07-24 MED ORDER — AMLODIPINE BESYLATE 5 MG PO TABS: 5.0000 mg | ORAL_TABLET | Freq: Every day | ORAL | 0 refills | Status: DC

## 2022-07-24 NOTE — Telephone Encounter (Signed)
Patient called in and stated that his medication  amLODipine (NORVASC) 5 MG tabletfell in  some water. His medication is delivered through mail order,which will take 7 days to come. He would like to know if a few can be called in for him until then to  [facility] [address] Phone: [phone]  Fax: [phone]

## 2022-07-24 NOTE — Telephone Encounter (Signed)
Spoke to pt. Sent 7 to [facility].

## 2022-08-14 ENCOUNTER — Encounter: Payer: Self-pay | Admitting: Internal Medicine

## 2022-08-14 ENCOUNTER — Ambulatory Visit (INDEPENDENT_AMBULATORY_CARE_PROVIDER_SITE_OTHER): Payer: Medicare HMO | Admitting: Internal Medicine

## 2022-08-14 VITALS — BP 120/70 | HR 60 | Temp 97.9°F | Ht 71.0 in | Wt 219.0 lb

## 2022-08-14 DIAGNOSIS — G629 Polyneuropathy, unspecified: Secondary | ICD-10-CM | POA: Diagnosis not present

## 2022-08-14 NOTE — Patient Instructions (Signed)
Please consider 2 over the counter options for the nerve problems----  TENS machine (a transcutaneous nerve stimulator)  Alpha lipoic acid  If you have ongoing problems and would like to see a neurologist, let me know.

## 2022-08-14 NOTE — Progress Notes (Signed)
Subjective:    Patient ID: Matthew Carter, male    DOB: 1940-11-20, 82 y.o.   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 7 tablet 0   aspirin 81 MG tablet Take 81 mg by mouth daily.     Cyanocobalamin (B-12 PO) Take by mouth.  over the counter B12 500 mcg under your tongue or 1000 mcg oral every day     dorzolamide-timolol (COSOPT) 22.3-6.8 MG/ML ophthalmic solution      latanoprost (XALATAN) 0.005 % ophthalmic solution Place 1 drop into both eyes at bedtime. 2.5 mL 0   loratadine (CLARITIN) 10 MG tablet Take 10 mg by mouth daily as needed for allergies.     No current facility-administered medications on file prior to visit.    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   Cataract    ED (erectile dysfunction)    Glaucoma    Glucose intolerance (impaired glucose tolerance)    Hemorrhoids    Hypertension     Past Surgical History:  Procedure Laterality Date   COLONOSCOPY     CYSTOSCOPY N/A 04/28/2018   Procedure: Flexible CYSTOSCOPY;  Surgeon: Hollice Espy, MD;  Location: [facility];  Service: Urology;  Laterality: N/A;  will be using flexible cystoscope   PENILE BIOPSY N/A 04/28/2018   Procedure: PENILE BIOPSY;  Surgeon: Hollice Espy, MD;  Location: [facility];  Service: Urology;  Laterality: N/A;   TRANSURETHRAL RESECTION OF BLADDER TUMOR N/A 01/09/2017   Procedure: TRANSURETHRAL RESECTION OF BLADDER TUMOR (TURBT)-(MEDIUM);  Surgeon: Nickie Retort, MD;  Location: [facility];   Service: Urology;  Laterality: N/A;    Family History  Problem Relation Age of Onset   GER disease Mother    Hypertension Mother    Kidney failure Mother    Stroke Father    Hypertension Father    Cancer Paternal Grandfather        Leukemia   Diabetes Paternal Grandfather    Cancer Brother        throat cancer   Colon cancer Neg Hx    Prostate cancer Neg Hx    Bladder Cancer Neg Hx    Esophageal cancer Neg Hx    Stomach cancer Neg Hx    Rectal cancer Neg Hx     Social History   Socioeconomic History   Marital status: Married    Spouse name: Not on file   Number of children: 3   Years of education: Not on file   Highest education level: Not on file  Occupational History   Occupation: Retired Information systems manager: [employer]   Occupation: [place] office    Comment: part time   Occupation: Estate manager/land agent part time    Comment: [employer]  Tobacco Use   Smoking status: Former    Types: Cigarettes    Quit date: 08/04/1981    Years since quitting: 41.0    Passive exposure: Never   Smokeless tobacco:  Never  Vaping Use   Vaping Use: Never used  Substance and Sexual Activity   Alcohol use: No    Alcohol/week: 0.0 standard drinks of alcohol   Drug use: No   Sexual activity: Not on file  Other Topics Concern   Not on file  Social History Narrative   Has living will    No formal health care POA but requests wife--alternate is son   Would accept resuscitation attempts but no prolonged artificial ventilation   Would accept a feeding tube   Social Determinants of Health   Financial Resource Strain: Not on file  Food Insecurity: Not on file  Transportation Needs: Not on file  Physical Activity: Not on file  Stress: Not on file  Social Connections: Not on file  Intimate Partner Violence: Not on file   Review of Systems He does have a vibration machine he puts his foot on    Objective:   Physical Exam Constitutional:      Appearance: Normal  appearance.  Neurological:     Mental Status: He is alert.  Psychiatric:        Mood and Affect: Mood normal.        Behavior: Behavior normal.            Assessment & Plan:

## 2022-08-14 NOTE — Assessment & Plan Note (Signed)
Doesn't seem to be progressive but still bothersome Mostly in his heel---and not preventing him from driving Discussed alpha lipoic acid and TENS machine--both available OTC If ongoing issues, can refer to neurology

## 2022-10-20 DIAGNOSIS — H401132 Primary open-angle glaucoma, bilateral, moderate stage: Secondary | ICD-10-CM | POA: Diagnosis not present

## 2023-01-14 ENCOUNTER — Ambulatory Visit (INDEPENDENT_AMBULATORY_CARE_PROVIDER_SITE_OTHER): Payer: Medicare HMO | Admitting: Internal Medicine

## 2023-01-14 ENCOUNTER — Encounter: Payer: Self-pay | Admitting: Internal Medicine

## 2023-01-14 VITALS — BP 122/84 | HR 76 | Temp 97.9°F | Ht 71.0 in | Wt 217.0 lb

## 2023-01-14 DIAGNOSIS — G629 Polyneuropathy, unspecified: Secondary | ICD-10-CM

## 2023-01-14 NOTE — Assessment & Plan Note (Addendum)
Mostly in right heel and really affects him with driving Discussed Rx alternatives----first option to me would be duloxetine if Rx---but concern for side effects  Asked him to try lidocaine topical before driving Continue the support socks Next step would be alpha lipoic acid Could also consider TENS unit

## 2023-01-14 NOTE — Progress Notes (Signed)
Subjective:    Patient ID: Matthew Carter, male    DOB: 09/06/1940, 82 y.o.   MRN: [id number]  HPI Here due to ongoing right foot issues With wife  Still having trouble driving with right foot Mostly the heel Doesn't feel good--but can feel the gas/brake pedals Feels pain Doesn't notice it while walking No problems at night----just gets sensation of cold (and just puts blanket on it) Has mild swelling---as the day goes on  No injury  Compression socks help some in the car  Current Outpatient Medications on File Prior to Visit  Medication Sig Dispense Refill   amLODipine (NORVASC) 5 MG tablet Take 1 tablet (5 mg total) by mouth daily. 7 tablet 0   aspirin 81 MG tablet Take 81 mg by mouth daily.     Cyanocobalamin (B-12 PO) Take by mouth.  over the counter B12 500 mcg under your tongue or 1000 mcg oral every day     dorzolamide-timolol (COSOPT) 22.3-6.8 MG/ML ophthalmic solution      latanoprost (XALATAN) 0.005 % ophthalmic solution Place 1 drop into both eyes at bedtime. 2.5 mL 0   loratadine (CLARITIN) 10 MG tablet Take 10 mg by mouth daily as needed for allergies.     No current facility-administered medications on file prior to visit.    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   Cataract    ED (erectile dysfunction)    Glaucoma    Glucose intolerance (impaired glucose tolerance)    Hemorrhoids    Hypertension     Past Surgical History:  Procedure Laterality Date   COLONOSCOPY     CYSTOSCOPY N/A 04/28/2018   Procedure: Flexible CYSTOSCOPY;  Surgeon: Vanna Scotland, MD;  Location: [facility];  Service: Urology;  Laterality: N/A;  will be using flexible cystoscope   PENILE BIOPSY N/A 04/28/2018   Procedure: PENILE BIOPSY;  Surgeon: Vanna Scotland, MD;  Location: [facility];  Service: Urology;  Laterality: N/A;   TRANSURETHRAL RESECTION OF BLADDER TUMOR N/A 01/09/2017    Procedure: TRANSURETHRAL RESECTION OF BLADDER TUMOR (TURBT)-(MEDIUM);  Surgeon: Hildred Laser, MD;  Location: [facility];  Service: Urology;  Laterality: N/A;    Family History  Problem Relation Age of Onset   GER disease Mother    Hypertension Mother    Kidney failure Mother    Stroke Father    Hypertension Father    Cancer Paternal Grandfather        Leukemia   Diabetes Paternal Grandfather    Cancer Brother        throat cancer   Colon cancer Neg Hx    Prostate cancer Neg Hx    Bladder Cancer Neg Hx    Esophageal cancer Neg Hx    Stomach cancer Neg Hx    Rectal cancer Neg Hx     Social History   Socioeconomic History   Marital status: Married    Spouse name: Not on file   Number of children: 3   Years of education: Not on file   Highest education level: Not on file  Occupational History   Occupation: Retired Chiropodist: [employer]   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: part time   Occupation: Development worker, community part time    Comment: [employer]  Tobacco Use   Smoking status: Former    Types:  Cigarettes    Quit date: 08/04/1981    Years since quitting: 41.4    Passive exposure: Never   Smokeless tobacco: Never  Vaping Use   Vaping Use: Never used  Substance and Sexual Activity   Alcohol use: No    Alcohol/week: 0.0 standard drinks of alcohol   Drug use: No   Sexual activity: Not on file  Other Topics Concern   Not on file  Social History Narrative   Has living will    No formal health care POA but requests wife--alternate is son   Would accept resuscitation attempts but no prolonged artificial ventilation   Would accept a feeding tube   Social Determinants of Health   Financial Resource Strain: Not on file  Food Insecurity: Not on file  Transportation Needs: Not on file  Physical Activity: Not on file  Stress: Not on file  Social Connections: Not on file  Intimate Partner Violence: Not on file   Review of Systems      Objective:   Physical Exam Constitutional:      Appearance: Normal appearance.  Musculoskeletal:     Comments: No sig foot/ankle swelling No heel tenderness Normal foot strength---no weakness (full flexion/extension)  Neurological:     Mental Status: He is alert.            Assessment & Plan:

## 2023-01-14 NOTE — Patient Instructions (Signed)
Please try over the counter topical lidocaine (either gel or patch) before driving. If that doesn't help, you can try over the counter alpha lipoic acid (this is pills)

## 2023-03-04 ENCOUNTER — Encounter (INDEPENDENT_AMBULATORY_CARE_PROVIDER_SITE_OTHER): Payer: Self-pay

## 2023-03-10 ENCOUNTER — Ambulatory Visit: Payer: Medicare HMO | Admitting: Podiatry

## 2023-03-10 DIAGNOSIS — M7661 Achilles tendinitis, right leg: Secondary | ICD-10-CM

## 2023-03-10 NOTE — Progress Notes (Signed)
  Subjective:  Patient ID: Matthew Carter, male    DOB: 1941-03-22,  MRN: [id number]  Chief Complaint  Patient presents with   Foot Pain    Right heel pain when driving     82 y.o. male presents with the above complaint.  Patient presents with right Achilles insertional pain.  Patient states painful to touch mostly when driving.  Not when he is walking.  He would like to discuss treatment options for it.  Pain scale 7 out of 10 dull achy in nature.   Review of Systems: Negative except as noted in the HPI. Denies N/V/F/Ch.  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  over the counter B12 500 mcg under your tongue or 1000 mcg oral every day, Disp: , Rfl:    dorzolamide-timolol (COSOPT) 22.3-6.8 MG/ML ophthalmic solution, , Disp: , Rfl:    latanoprost (XALATAN) 0.005 % ophthalmic solution, Place 1 drop into both eyes at bedtime., Disp: 2.5 mL, Rfl: 0   loratadine (CLARITIN) 10 MG tablet, Take 10 mg by mouth daily as needed for allergies., Disp: , Rfl:   Social History   Tobacco Use  Smoking Status Former   Current packs/day: 0.00   Types: Cigarettes   Quit date: 08/04/1981   Years since quitting: 41.6   Passive exposure: Never  Smokeless Tobacco Never    No Known Allergies Objective:  There were no vitals filed for this visit. There is no height or weight on file to calculate BMI. Constitutional Well developed. Well nourished.  Vascular Dorsalis pedis pulses palpable bilaterally. Posterior tibial pulses palpable  bilaterally. Capillary refill normal to all digits.  No cyanosis or clubbing noted. Pedal hair growth normal.  Neurologic Normal speech. Oriented to person, place, and time. Epicritic sensation to light touch grossly present bilaterally.  Dermatologic Nails well groomed and normal in appearance. No open wounds. No skin lesions.  Orthopedic: Pain on palpation right Achilles tendon insertion positive Silfverskiold test noted with gastrocnemius equinus Haglund's deformity clinically appreciated.  Pain with dorsiflexion of the ankle joint no pain with plantarflexion of the ankle joint.  No pain at the peroneal tendon ATFL ligament posterior tibial tendon   Radiographs: None Assessment:   1. Right Achilles tendinitis    Plan:  Patient was evaluated and treated and all questions answered.  Right Achilles tendinitis -All questions and concerns were discussed with the patient in extensive detail given the amount of pain that he is having he will benefit from cam boot immobilization -Cam boot was dispensed. -If there is no improvement we will discuss steroid injection versus MRI during next visit  No follow-ups on file.

## 2023-03-31 ENCOUNTER — Ambulatory Visit: Payer: Medicare HMO | Admitting: Internal Medicine

## 2023-03-31 ENCOUNTER — Encounter: Payer: Self-pay | Admitting: Internal Medicine

## 2023-03-31 VITALS — BP 130/68 | HR 72 | Temp 97.9°F | Ht 70.5 in | Wt 216.0 lb

## 2023-03-31 DIAGNOSIS — G250 Essential tremor: Secondary | ICD-10-CM | POA: Diagnosis not present

## 2023-03-31 DIAGNOSIS — N1831 Chronic kidney disease, stage 3a: Secondary | ICD-10-CM

## 2023-03-31 DIAGNOSIS — Z8551 Personal history of malignant neoplasm of bladder: Secondary | ICD-10-CM | POA: Diagnosis not present

## 2023-03-31 DIAGNOSIS — Z0000 Encounter for general adult medical examination without abnormal findings: Secondary | ICD-10-CM

## 2023-03-31 DIAGNOSIS — G629 Polyneuropathy, unspecified: Secondary | ICD-10-CM | POA: Diagnosis not present

## 2023-03-31 DIAGNOSIS — E538 Deficiency of other specified B group vitamins: Secondary | ICD-10-CM

## 2023-03-31 DIAGNOSIS — I872 Venous insufficiency (chronic) (peripheral): Secondary | ICD-10-CM

## 2023-03-31 DIAGNOSIS — I10 Essential (primary) hypertension: Secondary | ICD-10-CM | POA: Diagnosis not present

## 2023-03-31 LAB — RENAL FUNCTION PANEL
Albumin: 3.6 g/dL (ref 3.5–5.2)
BUN: 14 mg/dL (ref 6–23)
CO2: 32 mEq/L (ref 19–32)
Calcium: 9.6 mg/dL (ref 8.4–10.5)
Chloride: 104 mEq/L (ref 96–112)
Creatinine, Ser: 1.34 mg/dL (ref 0.40–1.50)
GFR: 50 mL/min — ABNORMAL LOW (ref >60.00)
Glucose, Bld: 96 mg/dL (ref 70–99)
Phosphorus: 3.1 mg/dL (ref 2.3–4.6)
Potassium: 4 mEq/L (ref 3.5–5.1)
Sodium: 141 mEq/L (ref 135–145)

## 2023-03-31 LAB — HEPATIC FUNCTION PANEL
ALT: 11 U/L (ref 0–53)
AST: 15 U/L (ref 0–37)
Albumin: 3.6 g/dL (ref 3.5–5.2)
Alkaline Phosphatase: 84 U/L (ref 39–117)
Bilirubin, Direct: 0 mg/dL (ref 0.0–0.3)
Total Bilirubin: 0.3 mg/dL (ref 0.2–1.2)
Total Protein: 7 g/dL (ref 6.0–8.3)

## 2023-03-31 LAB — CBC
HCT: 37.1 % — ABNORMAL LOW (ref 39.0–52.0)
Hemoglobin: 11.9 g/dL — ABNORMAL LOW (ref 13.0–17.0)
MCHC: 32.2 g/dL (ref 30.0–36.0)
MCV: 88.1 fl (ref 78.0–100.0)
Platelets: 241 10*3/uL (ref 150.0–400.0)
RBC: 4.21 Mil/uL — ABNORMAL LOW (ref 4.22–5.81)
RDW: 14.9 % (ref 11.5–15.5)
WBC: 4.4 10*3/uL (ref 4.0–10.5)

## 2023-03-31 LAB — VITAMIN B12: Vitamin B-12: 642 pg/mL (ref 211–911)

## 2023-03-31 NOTE — Assessment & Plan Note (Signed)
Continues on the daily supplement

## 2023-03-31 NOTE — Assessment & Plan Note (Signed)
Mild and doesn't affect him much Would consider low dose beta blocker if worse

## 2023-03-31 NOTE — Assessment & Plan Note (Signed)
Borderline No action unless significant change

## 2023-03-31 NOTE — Progress Notes (Signed)
 Hearing Screening - Comments:: Has hearing aids. Not wearing them today Vision Screening - Comments:: January 2024

## 2023-03-31 NOTE — Assessment & Plan Note (Signed)
Ongoing issues Uses TENS and other OTC remedies

## 2023-03-31 NOTE — Patient Instructions (Signed)
Please get the tetanus booster and updated COVID vaccine soon--at the pharmacy. Then get the flu vaccine and one time RSV later (like early October)

## 2023-03-31 NOTE — Progress Notes (Signed)
Subjective:    Patient ID: Matthew Carter, male    DOB: 12-04-1940, 82 y.o.   MRN: [id number]  HPI Here for Medicare wellness visit and follow up of chronic health conditions Reviewed advanced directives Reviewed other doctors--Dr Brasington--ophthal, Dr Charyl Dancer, Dr Patel--podiatry No hospitalizations or surgery in the past year Vision is good Hearing aides --they do help Has been walking more--not back at the gym. 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 No alcohol or tobacco No falls No depression or anhedonia Independent with instrumental ADLs No sig memory issues  Still having trouble with his feet Mostly the right foot while driving Went to podiatry--got a boot for a month--seemed to help the swelling Some help with TENS Is on the B12 Ongoing trouble driving  No chest pain or SOB No dizziness or syncope No palpitations Some edema--the boot helps some No headaches  Reviewed labs GFR borderline at 59 last year  Voids okay No blood  No longer needs cystoscopies  Current Outpatient Medications on File Prior to Visit  Medication Sig Dispense Refill   amLODipine (NORVASC) 5 MG tablet Take 1 tablet (5 mg total) by mouth daily. 7 tablet 0   aspirin 81 MG tablet Take 81 mg by mouth daily.     Cyanocobalamin (B-12 PO) Take by mouth.  over the counter B12 500 mcg under your tongue or 1000 mcg oral every day     dorzolamide-timolol (COSOPT) 22.3-6.8 MG/ML ophthalmic solution      latanoprost (XALATAN) 0.005 % ophthalmic solution Place 1 drop into both eyes at bedtime. 2.5 mL 0   loratadine (CLARITIN) 10 MG tablet Take 10 mg by mouth daily as needed for allergies.     No current facility-administered medications on file prior to visit.    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   Cataract    ED (erectile dysfunction)    Glaucoma    Glucose intolerance (impaired glucose  tolerance)    Hemorrhoids    Hypertension     Past Surgical History:  Procedure Laterality Date   COLONOSCOPY     CYSTOSCOPY N/A 04/28/2018   Procedure: Flexible CYSTOSCOPY;  Surgeon: Vanna Scotland, MD;  Location: [facility];  Service: Urology;  Laterality: N/A;  will be using flexible cystoscope   PENILE BIOPSY N/A 04/28/2018   Procedure: PENILE BIOPSY;  Surgeon: Vanna Scotland, MD;  Location: [facility];  Service: Urology;  Laterality: N/A;   TRANSURETHRAL RESECTION OF BLADDER TUMOR N/A 01/09/2017   Procedure: TRANSURETHRAL RESECTION OF BLADDER TUMOR (TURBT)-(MEDIUM);  Surgeon: Hildred Laser, MD;  Location: [facility];  Service: Urology;  Laterality: N/A;    Family History  Problem Relation Age of Onset   GER disease Mother    Hypertension Mother    Kidney failure Mother    Stroke Father    Hypertension Father    Cancer Paternal Grandfather        Leukemia   Diabetes Paternal Grandfather    Cancer Brother        throat cancer   Colon cancer Neg Hx    Prostate cancer Neg Hx    Bladder Cancer Neg Hx    Esophageal cancer Neg Hx    Stomach cancer Neg Hx    Rectal cancer Neg Hx     Social History   Socioeconomic History   Marital status: Married  Spouse name: Not on file   Number of children: 3   Years of education: Not on file   Highest education level: Not on file  Occupational History   Occupation: Retired Chiropodist: [employer]   Occupation: Magazine features editor    Comment: part time   Occupation: Development worker, community part time    Comment: [employer]  Tobacco Use   Smoking status: Former    Current packs/day: 0.00    Types: Cigarettes    Quit date: 08/04/1981    Years since quitting: 41.6    Passive exposure: Never   Smokeless tobacco: Never  Vaping Use   Vaping status: Never Used  Substance and Sexual Activity   Alcohol use: No    Alcohol/week: 0.0 standard drinks of alcohol   Drug use: No   Sexual activity: Not on file  Other Topics  Concern   Not on file  Social History Narrative   Has living will    No formal health care POA but requests wife--alternate is son   Would accept resuscitation attempts but no prolonged artificial ventilation   Would accept a feeding tube   Social Determinants of Health   Financial Resource Strain: Not on file  Food Insecurity: Not on file  Transportation Needs: Not on file  Physical Activity: Not on file  Stress: Not on file  Social Connections: Not on file  Intimate Partner Violence: Not on file   Review of Systems Appetite is okay--down from the past Weight is stable Sleeps fine Wears seat belt Full dentures---no dentist No suspicious skin lesions No heartburn or dysphagia Bowels move fine--no blood No sig back or joint pains    Objective:   Physical Exam Constitutional:      Appearance: Normal appearance.  HENT:     Mouth/Throat:     Pharynx: No oropharyngeal exudate or posterior oropharyngeal erythema.  Eyes:     Conjunctiva/sclera: Conjunctivae normal.     Pupils: Pupils are equal, round, and reactive to light.  Cardiovascular:     Rate and Rhythm: Normal rate and regular rhythm.     Pulses: Normal pulses.     Heart sounds: No murmur heard.    No gallop.  Pulmonary:     Effort: Pulmonary effort is normal.     Breath sounds: Normal breath sounds. No wheezing or rales.  Abdominal:     Palpations: Abdomen is soft.     Tenderness: There is no abdominal tenderness.  Musculoskeletal:     Cervical back: Neck supple.     Comments: Trace right > left ankle edema  Lymphadenopathy:     Cervical: No cervical adenopathy.  Skin:    Findings: No lesion or rash.  Neurological:     General: No focal deficit present.     Mental Status: He is alert and oriented to person, place, and time.     Comments: Word naming--10/1 minute Recall 3/3  Psychiatric:        Mood and Affect: Mood normal.        Behavior: Behavior normal.            Assessment & Plan:

## 2023-03-31 NOTE — Assessment & Plan Note (Signed)
BP Readings from Last 3 Encounters:  03/31/23 130/68  01/14/23 122/84  08/14/22 120/70   Doing well with the amlodipine 5mg 

## 2023-03-31 NOTE — Assessment & Plan Note (Signed)
Done with cystoscopies

## 2023-03-31 NOTE — Assessment & Plan Note (Signed)
I have personally reviewed the Medicare Annual Wellness questionnaire and have noted 1. The patient's medical and social history 2. Their use of alcohol, tobacco or illicit drugs 3. Their current medications and supplements 4. The patient's functional ability including ADL's, fall risks, home safety risks and hearing or visual             impairment. 5. Diet and physical activities 6. Evidence for depression or mood disorders  The patients weight, height, BMI and visual acuity have been recorded in the chart I have made referrals, counseling and provided education to the patient based review of the above and I have provided the pt with a written personalized care plan for preventive services.  I have provided you with a copy of your personalized plan for preventive services. Please take the time to review along with your updated medication list.  Discussed adding resistance exercise to his walking Done with cancer screening due to age Needs tetanus booster--and COVID update now Flu and one time RSV later in fall

## 2023-03-31 NOTE — Assessment & Plan Note (Signed)
No diuretic

## 2023-04-07 ENCOUNTER — Ambulatory Visit: Payer: Medicare HMO | Admitting: Podiatry

## 2023-04-16 ENCOUNTER — Ambulatory Visit: Payer: Medicare HMO | Admitting: Podiatry

## 2023-04-16 DIAGNOSIS — M9261 Juvenile osteochondrosis of tarsus, right ankle: Secondary | ICD-10-CM

## 2023-04-16 DIAGNOSIS — M7661 Achilles tendinitis, right leg: Secondary | ICD-10-CM

## 2023-04-16 NOTE — Progress Notes (Signed)
  Subjective:  Patient ID: Matthew Carter, male    DOB: [birth year],  MRN: [id number]  Chief Complaint  Patient presents with   Foot Pain    82 y.o. male presents with the above complaint.  Patient presents with right Achilles insertional pain.  He states his pain is doing better in a boot.  However he still has some residual pain he like to discuss next treatment plan   Review of Systems: Negative except as noted in the HPI. Denies N/V/F/Ch.  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  over the counter B12 500 mcg under your tongue or 1000 mcg oral every day, Disp: , Rfl:    dorzolamide-timolol (COSOPT) 22.3-6.8 MG/ML ophthalmic solution, , Disp: , Rfl:    latanoprost (XALATAN) 0.005 % ophthalmic solution, Place 1 drop into both eyes at bedtime., Disp: 2.5 mL, Rfl: 0   loratadine (CLARITIN) 10 MG tablet, Take 10 mg by mouth daily as needed for allergies., Disp: , Rfl:   Social History   Tobacco Use  Smoking Status Former   Current packs/day: 0.00   Types: Cigarettes   Quit date: 08/04/1981   Years since quitting: 41.7   Passive exposure: Never  Smokeless Tobacco Never    No Known Allergies Objective:  There were no vitals filed for this visit. There is no height or weight on file to calculate BMI. Constitutional Well developed. Well nourished.  Vascular Dorsalis pedis pulses palpable bilaterally. Posterior tibial pulses palpable bilaterally. Capillary refill normal to all digits.  No cyanosis or clubbing noted. Pedal  hair growth normal.  Neurologic Normal speech. Oriented to person, place, and time. Epicritic sensation to light touch grossly present bilaterally.  Dermatologic Nails well groomed and normal in appearance. No open wounds. No skin lesions.  Orthopedic: Pain on palpation right Achilles tendon insertion positive Silfverskiold test noted with gastrocnemius equinus Haglund's deformity clinically appreciated.  Pain with dorsiflexion of the ankle joint no pain with plantarflexion of the ankle joint.  No pain at the peroneal tendon ATFL ligament posterior tibial tendon   Radiographs: None Assessment:   1. Right Achilles tendinitis   2. Haglund's deformity, right     Plan:  Patient was evaluated and treated and all questions answered.  Right Achilles tendinitis underlying Haglund's deformity -All questions and concerns were discussed with the patient in extensive detail clinically cam boot immobilization helped him considerably.  He still has some residual pain he benefit from a steroid injection I discussed the risk of rupture associate with that he states understand like to proceed with steroid injection -A steroid injection was performed at right Kager's fat pad using 1% plain Lidocaine and 10 mg of Kenalog. This was well tolerated.   No follow-ups on file.

## 2023-04-27 DIAGNOSIS — Z961 Presence of intraocular lens: Secondary | ICD-10-CM | POA: Diagnosis not present

## 2023-04-27 DIAGNOSIS — H401132 Primary open-angle glaucoma, bilateral, moderate stage: Secondary | ICD-10-CM | POA: Diagnosis not present

## 2023-05-05 ENCOUNTER — Ambulatory Visit (INDEPENDENT_AMBULATORY_CARE_PROVIDER_SITE_OTHER): Payer: Medicare HMO

## 2023-05-05 DIAGNOSIS — Z23 Encounter for immunization: Secondary | ICD-10-CM | POA: Diagnosis not present

## 2023-06-04 DIAGNOSIS — H401132 Primary open-angle glaucoma, bilateral, moderate stage: Secondary | ICD-10-CM | POA: Diagnosis not present

## 2023-06-25 DIAGNOSIS — H401132 Primary open-angle glaucoma, bilateral, moderate stage: Secondary | ICD-10-CM | POA: Diagnosis not present

## 2023-06-25 DIAGNOSIS — Z961 Presence of intraocular lens: Secondary | ICD-10-CM | POA: Diagnosis not present

## 2023-06-29 ENCOUNTER — Telehealth: Payer: Self-pay | Admitting: Internal Medicine

## 2023-06-29 MED ORDER — AMLODIPINE BESYLATE 5 MG PO TABS: 5.0000 mg | ORAL_TABLET | Freq: Every day | ORAL | 3 refills | Status: DC

## 2023-06-29 NOTE — Telephone Encounter (Signed)
Rx sent electronically.

## 2023-06-29 NOTE — Telephone Encounter (Signed)
Prescription Request  06/29/2023  LOV: 03/31/2023  What is the name of the medication or equipment? amLODipine (NORVASC) 5 MG tablet    Have you contacted your pharmacy to request a refill? Yes   Which pharmacy would you like this sent to?   [id number] - [place], [place] - [address] AT [facility] [address] [facility] [address] [place] [address] Phone: [phone] Fax: [phone]    Patient notified that their request is being sent to the clinical staff for review and that they should receive a response within 2 business days.   Please advise at Mobile [phone] (mobile)

## 2023-06-30 MED ORDER — AMLODIPINE BESYLATE 5 MG PO TABS: 5.0000 mg | ORAL_TABLET | Freq: Every day | ORAL | 3 refills | Status: DC

## 2023-06-30 NOTE — Addendum Note (Signed)
Addended by: Eual Fines on: 06/30/2023 03:46 PM   Modules accepted: Orders

## 2023-06-30 NOTE — Telephone Encounter (Signed)
Pt called asking why didn't he receive the full supplied amount of Amlodipine from [facility]? Told pt Pharmacy may not give him all the refills at once. Pt requested a call back from [place]. Call back # [phone]

## 2023-06-30 NOTE — Telephone Encounter (Signed)
Called and spoke to pt. Apologized that the message was not taken correctly. He states he asked for a small 7 day supply at [facility] [address] to hold him over until he got his mail order rx. I advised him I was not made aware of any of that and the pharmacy listed was [address]. We had not received a refill request from [facility] for his amlodipine. I have sent 90/3 to [facility]. [facility] is preparing a small rx for him to last until he gets the mail order. I apologized, again, that it was messed up.

## 2023-10-19 DIAGNOSIS — H401132 Primary open-angle glaucoma, bilateral, moderate stage: Secondary | ICD-10-CM | POA: Diagnosis not present

## 2024-01-29 DIAGNOSIS — H401132 Primary open-angle glaucoma, bilateral, moderate stage: Secondary | ICD-10-CM | POA: Diagnosis not present

## 2024-01-29 DIAGNOSIS — Z961 Presence of intraocular lens: Secondary | ICD-10-CM | POA: Diagnosis not present

## 2024-01-29 DIAGNOSIS — H43813 Vitreous degeneration, bilateral: Secondary | ICD-10-CM | POA: Diagnosis not present

## 2024-03-10 ENCOUNTER — Ambulatory Visit

## 2024-03-10 VITALS — BP 136/80 | Ht 70.5 in | Wt 218.4 lb

## 2024-03-10 DIAGNOSIS — Z0000 Encounter for general adult medical examination without abnormal findings: Secondary | ICD-10-CM | POA: Diagnosis not present

## 2024-03-10 NOTE — Progress Notes (Signed)
 Subjective:   Matthew Carter is a 83 y.o. who presents for a Medicare Wellness preventive visit.  As a reminder, Annual Wellness Visits don't include a physical exam, and some assessments may be limited, especially if this visit is performed virtually. We may recommend an in-person follow-up visit with your provider if needed.  Visit Complete: In person  Persons Participating in Visit: Patient.  AWV Questionnaire: No: Patient Medicare AWV questionnaire was not completed prior to this visit.  Cardiac Risk Factors include: advanced age (>25men, >28 women);hypertension;male gender;obesity (BMI >30kg/m2);sedentary lifestyle     Objective:    Today's Vitals   03/10/24 0925 03/10/24 0929  Weight: 218 lb 6.4 oz (99.1 kg)   Height: 5' 10.5 (1.791 m)   PainSc:  8    Body mass index is 30.89 kg/m.     03/10/2024    9:36 AM 01/02/2020    7:06 PM 04/28/2018    9:39 AM 04/20/2018   10:44 AM 12/30/2016    9:23 AM 12/05/2016   10:00 AM 11/22/2016   11:47 PM  Advanced Directives  Does Patient Have a Medical Advance Directive? Yes No No  No  No  No  No   Type of Estate agent of [place];Living will        Copy of Healthcare Power of Attorney in Chart? No - copy requested        Would patient like information on creating a medical advance directive?  No - Patient declined No - Patient declined  No - Patient declined         Data saved with a previous flowsheet row definition    Current Medications (verified) Outpatient Encounter Medications as of 03/10/2024  Medication Sig   amLODipine  (NORVASC ) 5 MG tablet Take 1 tablet (5 mg total) by mouth daily.   aspirin 81 MG tablet Take 81 mg by mouth daily.   Cyanocobalamin  (B-12 PO) Take by mouth.  over the counter B12 500 mcg under your tongue or 1000 mcg oral every day   dorzolamide-timolol (COSOPT) 22.3-6.8 MG/ML ophthalmic solution    latanoprost  (XALATAN ) 0.005 % ophthalmic solution Place 1 drop into both eyes at  bedtime.   loratadine (CLARITIN) 10 MG tablet Take 10 mg by mouth daily as needed for allergies.   No facility-administered encounter medications on file as of 03/10/2024.    Allergies (verified) Patient has no known allergies.   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   Cataract    ED (erectile dysfunction)    Glaucoma    Glucose intolerance (impaired glucose tolerance)    Hemorrhoids    Hypertension    Past Surgical History:  Procedure Laterality Date   COLONOSCOPY     CYSTOSCOPY N/A 04/28/2018   Procedure: Flexible CYSTOSCOPY;  Surgeon: Penne Knee, MD;  Location: [facility];  Service: Urology;  Laterality: N/A;  will be using flexible cystoscope   PENILE BIOPSY N/A 04/28/2018   Procedure: PENILE BIOPSY;  Surgeon: Penne Knee, MD;  Location: [facility];  Service: Urology;  Laterality: N/A;   TRANSURETHRAL RESECTION OF BLADDER TUMOR N/A 01/09/2017   Procedure: TRANSURETHRAL RESECTION OF BLADDER TUMOR (TURBT)-(MEDIUM);  Surgeon: Chauncey Redell Agent, MD;  Location: [facility];  Service: Urology;  Laterality: N/A;   Family History  Problem Relation Age of Onset   GER disease Mother    Hypertension Mother  Kidney failure Mother    Stroke Father    Hypertension Father    Cancer Paternal Grandfather        Leukemia   Diabetes Paternal Grandfather    Cancer Brother        throat cancer   Colon cancer Neg Hx    Prostate cancer Neg Hx    Bladder Cancer Neg Hx    Esophageal cancer Neg Hx    Stomach cancer Neg Hx    Rectal cancer Neg Hx    Social History   Socioeconomic History   Marital status: Married    Spouse name: Not on file   Number of children: 3   Years of education: Not on file   Highest education level: Not on file  Occupational History   Occupation: Retired Chiropodist: [employer]   Occupation: Magazine features editor    Comment: part time   Occupation:  Development worker, community part time    Comment: [place]  Tobacco Use   Smoking status: Former    Current packs/day: 0.00    Types: Cigarettes    Quit date: 08/04/1981    Years since quitting: 42.6    Passive exposure: Never   Smokeless tobacco: Never  Vaping Use   Vaping status: Never Used  Substance and Sexual Activity   Alcohol use: No    Alcohol/week: 0.0 standard drinks of alcohol   Drug use: No   Sexual activity: Not on file  Other Topics Concern   Not on file  Social History Narrative   Has living will    No formal health care POA but requests wife--alternate is son   Would accept resuscitation attempts but no prolonged artificial ventilation   Would accept a feeding tube   Social Drivers of Health   Financial Resource Strain: Low Risk  (03/10/2024)   Overall Financial Resource Strain (CARDIA)    Difficulty of Paying Living Expenses: Not hard at all  Food Insecurity: No Food Insecurity (03/10/2024)   Hunger Vital Sign    Worried About Running Out of Food in the Last Year: Never true    Ran Out of Food in the Last Year: Never true  Transportation Needs: No Transportation Needs (03/10/2024)   PRAPARE - Administrator, Civil Service (Medical): No    Lack of Transportation (Non-Medical): No  Physical Activity: Inactive (03/10/2024)   Exercise Vital Sign    Days of Exercise per Week: 0 days    Minutes of Exercise per Session: 0 min  Stress: No Stress Concern Present (03/10/2024)   [employer] of Occupational Health - Occupational Stress Questionnaire    Feeling of Stress: Not at all  Social Connections: Socially Integrated (03/10/2024)   Social Connection and Isolation Panel    Frequency of Communication with Friends and Family: More than three times a week    Frequency of Social Gatherings with Friends and Family: More than three times a week    Attends Religious Services: More than 4 times per year    Active Member of [employer] or Organizations: Yes    Attends Museum/gallery exhibitions officer: More than 4 times per year    Marital Status: Married    Tobacco Counseling Counseling given: Not Answered    Clinical Intake:  Pre-visit preparation completed: Yes  Pain : 0-10 Pain Score: 8  Pain Type: Chronic pain Pain Location: Foot (both feet:pt says neuropathy) Pain Descriptors / Indicators: Aching, Tingling Pain Onset: More than a month ago  Pain Frequency: Intermittent Pain Relieving Factors: nothing much helps  Pain Relieving Factors: nothing much helps  BMI - recorded: 30.89 Nutritional Status: BMI > 30  Obese Nutritional Risks: None Diabetes: No  Lab Results  Component Value Date   HGBA1C 6.3 09/06/2015   HGBA1C 6.2 05/24/2010   HGBA1C 6.3 01/24/2009     How often do you need to have someone help you when you read instructions, pamphlets, or other written materials from your doctor or pharmacy?: 1 - Never  Interpreter Needed?: No  Comments: lives with wife and grandson Information entered by :: B.Iisha Soyars,LPN   Activities of Daily Living     03/10/2024    9:36 AM  In your present state of health, do you have any difficulty performing the following activities:  Hearing? 0  Vision? 0  Difficulty concentrating or making decisions? 0  Walking or climbing stairs? 0  Dressing or bathing? 0  Doing errands, shopping? 0  Preparing Food and eating ? N  Using the Toilet? N  In the past six months, have you accidently leaked urine? N  Do you have problems with loss of bowel control? N  Managing your Medications? N  Managing your Finances? N  Housekeeping or managing your Housekeeping? N    Patient Care Team: Jimmy Charlie FERNS, MD as PCP - General Mittie Gaskin, MD as Referring Physician (Ophthalmology)  I have updated your Care Teams any recent Medical Services you may have received from other providers in the past year.     Assessment:   This is a routine wellness examination for [place].  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       Patient Stated       I would like to get stomach down and decrease soda intake (and replace with water)       Depression Screen     03/10/2024    9:33 AM 03/31/2023    9:14 AM 03/25/2022   10:06 AM 03/19/2021    8:40 AM 03/15/2020    2:54 PM 03/15/2020    2:25 PM 01/07/2019   10:11 AM  PHQ 2/9 Scores  PHQ - 2 Score 0 0 0 0 0 0 0  PHQ- 9 Score      0     Fall Risk     03/10/2024    9:31 AM 03/31/2023    9:13 AM 03/25/2022   10:06 AM 03/19/2021    8:40 AM 03/15/2020    2:54 PM  Fall Risk   Falls in the past year? 0 0 0 0 1  Number falls in past yr: 0 0   0  Injury with Fall? 0 0   1  Risk for fall due to : No Fall Risks No Fall Risks     Follow up Education provided;Falls prevention discussed Falls evaluation completed   Falls prevention discussed      Data saved with a previous flowsheet row definition    MEDICARE RISK AT HOME:  Medicare Risk at Home Any stairs in or around the home?: Yes If  so, are there any without handrails?: Yes Home free of loose throw rugs in walkways, pet beds, electrical cords, etc?: Yes Adequate lighting in your home to reduce risk of falls?: Yes Life alert?: No Use of a cane, walker or w/c?: No Grab bars in the bathroom?: Yes Shower chair or bench in shower?: No Elevated toilet seat or a handicapped toilet?: No  TIMED UP AND GO:  Was the test performed?  Yes  Length of time to ambulate 10 feet: 12 sec Gait slow and steady without use of assistive device  Cognitive Function: 6CIT completed    12/05/2016   10:00 AM  MMSE - Mini Mental State Exam  Orientation to time 5    Orientation to Place 5   Registration 3   Attention/ Calculation 0   Recall 3   Language- name 2 objects 0   Language- repeat 1  Language- follow 3 step command 3   Language- read & follow direction 0   Write a sentence 0   Copy design 0   Total score 20      Data saved with a previous flowsheet row definition        03/10/2024    9:38 AM  6CIT Screen  What Year? 0 points  What month? 0 points  What time? 0 points  Count back from 20 0 points  Months in reverse 0 points  Repeat phrase 0 points  Total Score 0 points    Immunizations Immunization History  Administered Date(s) Administered   Fluad Quad(high Dose 65+) 04/05/2019, 04/14/2020, 05/03/2021, 04/15/2022   Fluad Trivalent(High Dose 65+) 05/05/2023   Influenza Split 05/01/2011, 04/26/2012   Influenza Whole 05/04/2001, 05/24/2009, 04/12/2010   Influenza,inj,Quad PF,6+ Mos 04/28/2013, 04/19/2014, 05/02/2015, 04/09/2016, 04/15/2017, 04/29/2018   Moderna Sars-Covid-2 Vaccination 08/16/2019, 09/13/2019, 05/30/2020, 03/13/2021   Pneumococcal Conjugate-13 11/29/2013   Pneumococcal Polysaccharide-23 08/21/2009, 01/04/2018   Td 08/19/2002, 11/24/2012   Zoster, Live 03/13/2016    Screening Tests Health Maintenance  Topic Date Due   Zoster Vaccines- Shingrix (1 of 2) 08/28/1992   DTaP/Tdap/Td (3 - Tdap) 11/25/2022   COVID-19 Vaccine (5 - 2024-25 season) 04/05/2023   INFLUENZA VACCINE  03/04/2024   Medicare Annual Wellness (AWV)  03/10/2025   Colonoscopy  07/10/2025   Pneumococcal Vaccine: 50+ Years  Completed   Hepatitis B Vaccines  Aged Out   HPV VACCINES  Aged Out   Meningococcal B Vaccine  Aged Out    Health Maintenance  Health Maintenance Due  Topic Date Due   Zoster Vaccines- Shingrix (1 of 2) 08/28/1992   DTaP/Tdap/Td (3 - Tdap) 11/25/2022   COVID-19 Vaccine (5 - 2024-25 season) 04/05/2023   INFLUENZA VACCINE  03/04/2024   Health Maintenance Items Addressed: None at this time -pt will get Shingles  vaccine at his pharmacy if he decides   Additional Screening:  Vision Screening: Recommended annual ophthalmology exams for early detection of glaucoma and other disorders of the eye. Would you like a referral to an eye doctor? No    Dental Screening: Recommended annual dental exams for proper oral hygiene  Community Resource Referral / Chronic Care Management: CRR required this visit?  No   CCM required this visit?  No   Plan:    I have personally reviewed and noted the following in the patient's chart:   Medical and social history Use of alcohol, tobacco or illicit drugs  Current medications and supplements including opioid prescriptions. Patient is not currently taking opioid prescriptions. Functional ability and status  Nutritional status Physical activity Advanced directives List of other physicians Hospitalizations, surgeries, and ER visits in previous 12 months Vitals Screenings to include cognitive, depression, and falls Referrals and appointments  In addition, I have reviewed and discussed with patient certain preventive protocols, quality metrics, and best practice recommendations. A written personalized care plan for preventive services as well as general preventive health recommendations were provided to patient.   Erminio LITTIE Saris, LPN   08/05/7972   After Visit Summary: (MyChart) Due to this being a telephonic visit, the after visit summary with patients personalized plan was offered to patient via MyChart   Notes: Nothing significant to report at this time.

## 2024-03-10 NOTE — Patient Instructions (Signed)
 Mr. Matthew Carter , Thank you for taking time out of your busy schedule to complete your Annual Wellness Visit with me. I enjoyed our conversation and look forward to speaking with you again next year. I, as well as your care team,  appreciate your ongoing commitment to your health goals. Please review the following plan we discussed and let me know if I can assist you in the future. Your Game plan/ To Do List    Referrals: If you haven't heard from the office you've been referred to, please reach out to them at the phone provided.   Follow up Visits: We will see or speak with you next year for your Next Medicare AWV with our clinical staff-03/14/25 @ 10:50am in person Have you seen your provider in the last 6 months (3 months if uncontrolled diabetes)? No  Clinician Recommendations:  Aim for 30 minutes of exercise or brisk walking, 6-8 glasses of water, and 5 servings of fruits and vegetables each day.       This is a list of the screenings recommended for you:  Health Maintenance  Topic Date Due   Zoster (Shingles) Vaccine (1 of 2) 08/28/1992   DTaP/Tdap/Td vaccine (3 - Tdap) 11/25/2022   COVID-19 Vaccine (5 - 2024-25 season) 04/05/2023   Flu Shot  03/04/2024   Medicare Annual Wellness Visit  03/10/2025   Colon Cancer Screening  07/10/2025   Pneumococcal Vaccine for age over 61  Completed   Hepatitis B Vaccine  Aged Out   HPV Vaccine  Aged Out   Meningitis B Vaccine  Aged Out    Advanced directives: (Copy Requested) Please bring a copy of your health care power of attorney and living will to the office to be added to your chart at your convenience. You can mail to [facility] [address] [place], [place] [address] or email to [email or web address] Advance Care Planning is important because it:  [x]  Makes sure you receive the medical care that is consistent with your values, goals, and preferences  [x]  It provides guidance to your family and loved ones and reduces  their decisional burden about whether or not they are making the right decisions based on your wishes.  Follow the link provided in your after visit summary or read over the paperwork we have mailed to you to help you started getting your Advance Directives in place. If you need assistance in completing these, please reach out to us  so that we can help you!

## 2024-03-30 ENCOUNTER — Encounter: Payer: Self-pay | Admitting: Internal Medicine

## 2024-03-30 ENCOUNTER — Ambulatory Visit (INDEPENDENT_AMBULATORY_CARE_PROVIDER_SITE_OTHER): Payer: Medicare HMO | Admitting: Internal Medicine

## 2024-03-30 VITALS — BP 138/80 | HR 74 | Temp 97.8°F | Ht 70.5 in | Wt 216.0 lb

## 2024-03-30 DIAGNOSIS — I10 Essential (primary) hypertension: Secondary | ICD-10-CM | POA: Diagnosis not present

## 2024-03-30 DIAGNOSIS — G250 Essential tremor: Secondary | ICD-10-CM | POA: Diagnosis not present

## 2024-03-30 DIAGNOSIS — G629 Polyneuropathy, unspecified: Secondary | ICD-10-CM | POA: Diagnosis not present

## 2024-03-30 DIAGNOSIS — Z0000 Encounter for general adult medical examination without abnormal findings: Secondary | ICD-10-CM

## 2024-03-30 DIAGNOSIS — N1831 Chronic kidney disease, stage 3a: Secondary | ICD-10-CM | POA: Diagnosis not present

## 2024-03-30 DIAGNOSIS — E538 Deficiency of other specified B group vitamins: Secondary | ICD-10-CM | POA: Diagnosis not present

## 2024-03-30 LAB — RENAL FUNCTION PANEL
Albumin: 3.7 g/dL (ref 3.5–5.2)
BUN: 10 mg/dL (ref 6–23)
CO2: 32 meq/L (ref 19–32)
Calcium: 9.5 mg/dL (ref 8.4–10.5)
Chloride: 102 meq/L (ref 96–112)
Creatinine, Ser: 1.01 mg/dL (ref 0.40–1.50)
GFR: 69.71 mL/min (ref >60.00)
Glucose, Bld: 103 mg/dL — ABNORMAL HIGH (ref 70–99)
Phosphorus: 3.5 mg/dL (ref 2.3–4.6)
Potassium: 4 meq/L (ref 3.5–5.1)
Sodium: 140 meq/L (ref 135–145)

## 2024-03-30 LAB — HEPATIC FUNCTION PANEL
ALT: 10 U/L (ref 0–53)
AST: 13 U/L (ref 0–37)
Albumin: 3.7 g/dL (ref 3.5–5.2)
Alkaline Phosphatase: 90 U/L (ref 39–117)
Bilirubin, Direct: 0.1 mg/dL (ref 0.0–0.3)
Total Bilirubin: 0.4 mg/dL (ref 0.2–1.2)
Total Protein: 6.8 g/dL (ref 6.0–8.3)

## 2024-03-30 LAB — CBC
HCT: 37.7 % — ABNORMAL LOW (ref 39.0–52.0)
Hemoglobin: 12.4 g/dL — ABNORMAL LOW (ref 13.0–17.0)
MCHC: 32.8 g/dL (ref 30.0–36.0)
MCV: 88.1 fl (ref 78.0–100.0)
Platelets: 232 K/uL (ref 150.0–400.0)
RBC: 4.28 Mil/uL (ref 4.22–5.81)
RDW: 14.9 % (ref 11.5–15.5)
WBC: 3.9 K/uL — ABNORMAL LOW (ref 4.0–10.5)

## 2024-03-30 LAB — VITAMIN B12: Vitamin B-12: 904 pg/mL (ref 211–911)

## 2024-03-30 NOTE — Assessment & Plan Note (Signed)
 Seems better No Rx needed

## 2024-03-30 NOTE — Assessment & Plan Note (Signed)
 BP Readings from Last 3 Encounters:  03/30/24 138/80  03/10/24 136/80  03/31/23 130/68   Controlled on amlodipine  May consider ACEI/ARB if GFR worsens

## 2024-03-30 NOTE — Assessment & Plan Note (Signed)
 Mostly a problem when driving Uses TENS unit and cream at night

## 2024-03-30 NOTE — Progress Notes (Signed)
 Subjective:    Patient ID: Matthew Carter, male    DOB: 1941/02/25, 83 y.o.   MRN: [id number]  HPI Here for physical Still works part time at church  No new concerns Ongoing glaucoma treatment--vision remains okay Walks some--stays active  Still has some trouble with feet Mostly bothers him when driving---the right heel doesn't feel right Uses TENS and is on B12 Uses cream at night that helps  Tremor is not an issue lately  No chest pain or SOB No dizziness or syncope No palpitations No edema  GFR in 50's over the past 2 years  Current Outpatient Medications on File Prior to Visit  Medication Sig Dispense Refill   amLODipine  (NORVASC ) 5 MG tablet Take 1 tablet (5 mg total) by mouth daily. 90 tablet 3   aspirin 81 MG tablet Take 81 mg by mouth daily.     Cyanocobalamin  (B-12 PO) Take by mouth.  over the counter B12 500 mcg under your tongue or 1000 mcg oral every day     dorzolamide-timolol (COSOPT) 22.3-6.8 MG/ML ophthalmic solution      latanoprost  (XALATAN ) 0.005 % ophthalmic solution Place 1 drop into both eyes at bedtime. 2.5 mL 0   loratadine (CLARITIN) 10 MG tablet Take 10 mg by mouth daily as needed for allergies.     No current facility-administered medications on file prior to visit.    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   Cataract    ED (erectile dysfunction)    Glaucoma    Glucose intolerance (impaired glucose tolerance)    Hemorrhoids    Hypertension     Past Surgical History:  Procedure Laterality Date   COLONOSCOPY     CYSTOSCOPY N/A 04/28/2018   Procedure: Flexible CYSTOSCOPY;  Surgeon: Penne Knee, MD;  Location: [facility];  Service: Urology;  Laterality: N/A;  will be using flexible cystoscope   PENILE BIOPSY N/A 04/28/2018   Procedure: PENILE BIOPSY;  Surgeon: Penne Knee, MD;  Location: [facility];  Service: Urology;  Laterality: N/A;    TRANSURETHRAL RESECTION OF BLADDER TUMOR N/A 01/09/2017   Procedure: TRANSURETHRAL RESECTION OF BLADDER TUMOR (TURBT)-(MEDIUM);  Surgeon: Chauncey Redell Agent, MD;  Location: [facility];  Service: Urology;  Laterality: N/A;    Family History  Problem Relation Age of Onset   GER disease Mother    Hypertension Mother    Kidney failure Mother    Stroke Father    Hypertension Father    Cancer Paternal Grandfather        Leukemia   Diabetes Paternal Grandfather    Cancer Brother        throat cancer   Colon cancer Neg Hx    Prostate cancer Neg Hx    Bladder Cancer Neg Hx    Esophageal cancer Neg Hx    Stomach cancer Neg Hx    Rectal cancer Neg Hx     Social History   Socioeconomic History   Marital status: Married    Spouse name: Not on file   Number of children: 3   Years of education: Not on file   Highest education level: Not on file  Occupational History   Occupation: Retired Chiropodist: [employer]   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: part time   Occupation: Development worker, community part time    Comment:  [place]  Tobacco Use   Smoking status: Former    Current packs/day: 0.00    Types: Cigarettes    Quit date: 08/04/1981    Years since quitting: 42.6    Passive exposure: Never   Smokeless tobacco: Never  Vaping Use   Vaping status: Never Used  Substance and Sexual Activity   Alcohol use: No    Alcohol/week: 0.0 standard drinks of alcohol   Drug use: No   Sexual activity: Not on file  Other Topics Concern   Not on file  Social History Narrative   Has living will    No formal health care POA but requests wife--alternate is son   Would accept resuscitation attempts but no prolonged artificial ventilation   Would accept a feeding tube   Social Drivers of Health   Financial Resource Strain: Low Risk  (03/10/2024)   Overall Financial Resource Strain (CARDIA)    Difficulty of Paying Living Expenses: Not hard at all  Food Insecurity: No Food  Insecurity (03/10/2024)   Hunger Vital Sign    Worried About Running Out of Food in the Last Year: Never true    Ran Out of Food in the Last Year: Never true  Transportation Needs: No Transportation Needs (03/10/2024)   PRAPARE - Administrator, Civil Service (Medical): No    Lack of Transportation (Non-Medical): No  Physical Activity: Inactive (03/10/2024)   Exercise Vital Sign    Days of Exercise per Week: 0 days    Minutes of Exercise per Session: 0 min  Stress: No Stress Concern Present (03/10/2024)   [employer] of Occupational Health - Occupational Stress Questionnaire    Feeling of Stress: Not at all  Social Connections: Socially Integrated (03/10/2024)   Social Connection and Isolation Panel    Frequency of Communication with Friends and Family: More than three times a week    Frequency of Social Gatherings with Friends and Family: More than three times a week    Attends Religious Services: More than 4 times per year    Active Member of [employer] or Organizations: Yes    Attends Engineer, structural: More than 4 times per year    Marital Status: Married  Catering manager Violence: Not At Risk (03/10/2024)   Humiliation, Afraid, Rape, and Kick questionnaire    Fear of Current or Ex-Partner: No    Emotionally Abused: No    Physically Abused: No    Sexually Abused: No   Review of Systems  Constitutional:  Negative for fatigue and unexpected weight change.       Wears seat belt  HENT:  Positive for hearing loss. Negative for tinnitus.        Has hearing aides Dentures   Eyes:  Negative for visual disturbance.  Respiratory:  Negative for cough, chest tightness and shortness of breath.   Cardiovascular:  Negative for chest pain, palpitations and leg swelling.  Gastrointestinal:  Negative for blood in stool and constipation.       No heartburn  Endocrine: Negative for polydipsia and polyuria.  Genitourinary:  Negative for difficulty urinating and urgency.        No sexual concerns  Musculoskeletal:  Negative for joint swelling.       Some shoulder/back/arm pains---mild. No meds  Skin:  Negative for rash.  Allergic/Immunologic: Negative for environmental allergies and immunocompromised state.  Neurological:  Negative for dizziness, syncope, light-headedness and headaches.  Hematological:  Negative for adenopathy. Does not bruise/bleed easily.  Psychiatric/Behavioral:  Negative for dysphoric mood and sleep disturbance. The patient is not nervous/anxious.        Objective:   Physical Exam Constitutional:      Appearance: Normal appearance.  HENT:     Mouth/Throat:     Pharynx: No oropharyngeal exudate or posterior oropharyngeal erythema.  Eyes:     Conjunctiva/sclera: Conjunctivae normal.     Pupils: Pupils are equal, round, and reactive to light.  Cardiovascular:     Rate and Rhythm: Normal rate and regular rhythm.     Pulses: Normal pulses.     Heart sounds: No murmur heard.    No gallop.  Pulmonary:     Effort: Pulmonary effort is normal.     Breath sounds: Normal breath sounds. No wheezing or rales.  Abdominal:     Palpations: Abdomen is soft.     Tenderness: There is no abdominal tenderness.  Musculoskeletal:     Cervical back: Neck supple.     Right lower leg: No edema.     Left lower leg: No edema.  Lymphadenopathy:     Cervical: No cervical adenopathy.  Skin:    Findings: No lesion or rash.  Neurological:     General: No focal deficit present.     Mental Status: He is alert and oriented to person, place, and time.  Psychiatric:        Mood and Affect: Mood normal.        Behavior: Behavior normal.            Assessment & Plan:

## 2024-03-30 NOTE — Assessment & Plan Note (Signed)
 Healthy Discussed fitness No cancer screening due to age Td and RSV Flu and COVID soon (for the fall)

## 2024-03-30 NOTE — Patient Instructions (Signed)
 You should get a tetanus update (Td) and RSV soon Then get flu and COVID vaccines in the fall

## 2024-03-30 NOTE — Assessment & Plan Note (Signed)
 Will recheck labs

## 2024-03-31 ENCOUNTER — Ambulatory Visit: Payer: Self-pay | Admitting: Internal Medicine

## 2024-04-06 ENCOUNTER — Ambulatory Visit (INDEPENDENT_AMBULATORY_CARE_PROVIDER_SITE_OTHER)

## 2024-04-06 DIAGNOSIS — Z23 Encounter for immunization: Secondary | ICD-10-CM | POA: Diagnosis not present

## 2024-04-06 NOTE — Progress Notes (Signed)
 Per orders of Dr. Jacques Copland, injection of HD flu given by Bobbette Sprague in right deltoid. Patient tolerated injection well.

## 2024-04-14 ENCOUNTER — Other Ambulatory Visit: Payer: Self-pay | Admitting: Family

## 2024-04-14 ENCOUNTER — Other Ambulatory Visit: Payer: Self-pay | Admitting: Internal Medicine

## 2024-04-14 DIAGNOSIS — Z23 Encounter for immunization: Secondary | ICD-10-CM

## 2024-04-14 MED ORDER — COVID-19 MRNA VAC-TRIS(PFIZER) 30 MCG/0.3ML IM SUSY: 0.3000 mL | PREFILLED_SYRINGE | Freq: Once | INTRAMUSCULAR | 0 refills | Status: AC

## 2024-04-14 MED ORDER — COVID-19 MRNA VACC (MODERNA) 50 MCG/0.5ML IM SUSP
0.5000 mL | Freq: Once | INTRAMUSCULAR | 0 refills | Status: AC
Start: 2024-04-14 — End: 2024-04-14

## 2024-04-14 MED ORDER — COVID-19 MRNA VACC (MODERNA) 50 MCG/0.5ML IM SUSP: 0.5000 mL | Freq: Once | INTRAMUSCULAR | 0 refills | Status: DC

## 2024-04-14 NOTE — Telephone Encounter (Signed)
 Pt called back to f/u. I informed him that request is actively pending.

## 2024-04-14 NOTE — Telephone Encounter (Signed)
 This RN made second attempt to contact patient with no answer. This RN left a voicemail with call back number provided.

## 2024-04-14 NOTE — Telephone Encounter (Signed)
 This RN made first attempt to contact pt with no answer. This RN left a voicemail with call back number provided.     Copied from CRM [id number]. Topic: Clinical - Medication Question >> Apr 14, 2024  8:00 AM Donna BRAVO wrote: Reason for CRM:  patient calling for Covid prescription to be sent to: [id number] - [place], [place] - [address] AT [facility] [address] [facility] [address] [place] [address] Phone: [phone] Fax: [phone]  Patient was informed  time frame for medication refills  is 3 business days.

## 2024-04-14 NOTE — Telephone Encounter (Signed)
 Original note just said pt wanted covid vaccine sent to pharmacy. I have pended vaccine Rx and will route to NP covering Dr. Marval pts

## 2024-04-14 NOTE — Telephone Encounter (Signed)
 This RN made third attempt to contact patient with no answer. This RN left a voicemail with call back number provided.

## 2024-06-16 ENCOUNTER — Other Ambulatory Visit: Payer: Self-pay

## 2024-06-16 MED ORDER — AMLODIPINE BESYLATE 5 MG PO TABS: 5.0000 mg | ORAL_TABLET | Freq: Every day | ORAL | 1 refills | Status: AC

## 2024-06-16 NOTE — Telephone Encounter (Signed)
 Pt needs a TOC with Dr Bennett in a few months please. Thank you.

## 2025-03-14 ENCOUNTER — Ambulatory Visit

## 2025-03-30 ENCOUNTER — Encounter
